# Patient Record
Sex: Female | Born: 1935
Health system: Southern US, Community
[De-identification: ages and names within clinical notes are randomized; demographics above are authoritative.]

## PROBLEM LIST (undated history)

## (undated) DIAGNOSIS — F419 Anxiety disorder, unspecified: Secondary | ICD-10-CM

## (undated) DIAGNOSIS — M1990 Unspecified osteoarthritis, unspecified site: Secondary | ICD-10-CM

## (undated) DIAGNOSIS — E785 Hyperlipidemia, unspecified: Secondary | ICD-10-CM

## (undated) DIAGNOSIS — I10 Essential (primary) hypertension: Secondary | ICD-10-CM

## (undated) HISTORY — PX: ABDOMINAL HYSTERECTOMY: SHX81

## (undated) HISTORY — DX: Hyperlipidemia, unspecified: E78.5

## (undated) HISTORY — PX: CHOLECYSTECTOMY: SHX55

## (undated) HISTORY — PX: APPENDECTOMY: SHX54

## (undated) HISTORY — DX: Anxiety disorder, unspecified: F41.9

## (undated) HISTORY — DX: Unspecified osteoarthritis, unspecified site: M19.90

## (undated) HISTORY — DX: Essential (primary) hypertension: I10

---

## 1999-05-30 ENCOUNTER — Encounter: Payer: Self-pay | Admitting: Internal Medicine

## 1999-05-30 ENCOUNTER — Encounter: Admission: RE | Admit: 1999-05-30 | Discharge: 1999-05-30 | Payer: Self-pay | Admitting: Internal Medicine

## 2000-06-15 ENCOUNTER — Encounter: Payer: Self-pay | Admitting: Internal Medicine

## 2000-06-15 ENCOUNTER — Encounter: Admission: RE | Admit: 2000-06-15 | Discharge: 2000-06-15 | Payer: Self-pay | Admitting: Internal Medicine

## 2001-03-26 ENCOUNTER — Encounter: Payer: Self-pay | Admitting: Internal Medicine

## 2001-03-26 ENCOUNTER — Encounter: Admission: RE | Admit: 2001-03-26 | Discharge: 2001-03-26 | Payer: Self-pay | Admitting: Internal Medicine

## 2001-06-23 ENCOUNTER — Encounter: Admission: RE | Admit: 2001-06-23 | Discharge: 2001-06-23 | Payer: Self-pay | Admitting: Internal Medicine

## 2001-06-23 ENCOUNTER — Encounter: Payer: Self-pay | Admitting: Internal Medicine

## 2003-04-13 ENCOUNTER — Encounter: Admission: RE | Admit: 2003-04-13 | Discharge: 2003-04-13 | Payer: Self-pay | Admitting: Internal Medicine

## 2003-04-13 ENCOUNTER — Encounter: Payer: Self-pay | Admitting: Internal Medicine

## 2004-05-28 ENCOUNTER — Encounter: Admission: RE | Admit: 2004-05-28 | Discharge: 2004-05-28 | Payer: Self-pay | Admitting: Internal Medicine

## 2004-09-27 ENCOUNTER — Ambulatory Visit: Payer: Self-pay | Admitting: Internal Medicine

## 2005-01-06 ENCOUNTER — Encounter: Admission: RE | Admit: 2005-01-06 | Discharge: 2005-01-06 | Payer: Self-pay | Admitting: Internal Medicine

## 2005-01-06 ENCOUNTER — Ambulatory Visit: Payer: Self-pay | Admitting: Internal Medicine

## 2005-10-20 ENCOUNTER — Encounter: Admission: RE | Admit: 2005-10-20 | Discharge: 2005-10-20 | Payer: Self-pay | Admitting: Internal Medicine

## 2006-06-08 ENCOUNTER — Ambulatory Visit: Payer: Self-pay | Admitting: Internal Medicine

## 2006-08-10 ENCOUNTER — Ambulatory Visit: Payer: Self-pay | Admitting: Internal Medicine

## 2006-08-10 LAB — CONVERTED CEMR LAB
ALT: 17 units/L (ref 0–40)
AST: 22 units/L (ref 0–37)
Albumin: 3.8 g/dL (ref 3.5–5.2)
Alkaline Phosphatase: 48 units/L (ref 39–117)
Bilirubin, Direct: 0.1 mg/dL (ref 0.0–0.3)
Cholesterol: 180 mg/dL (ref 0–200)
Direct LDL: 104.6 mg/dL
HDL: 34.9 mg/dL — ABNORMAL LOW (ref >39.0)
TSH: 0.94 microintl units/mL (ref 0.35–5.50)
Total Bilirubin: 0.7 mg/dL (ref 0.3–1.2)
Total CHOL/HDL Ratio: 5.2
Total Protein: 7.6 g/dL (ref 6.0–8.3)
Triglycerides: 209 mg/dL (ref 0–149)
VLDL: 42 mg/dL — ABNORMAL HIGH (ref 0–40)

## 2006-08-17 ENCOUNTER — Ambulatory Visit: Payer: Self-pay | Admitting: Internal Medicine

## 2006-11-09 ENCOUNTER — Ambulatory Visit: Payer: Self-pay | Admitting: Family Medicine

## 2006-11-09 ENCOUNTER — Encounter: Payer: Self-pay | Admitting: Internal Medicine

## 2006-11-18 ENCOUNTER — Encounter: Admission: RE | Admit: 2006-11-18 | Discharge: 2006-11-18 | Payer: Self-pay | Admitting: Internal Medicine

## 2007-01-11 DIAGNOSIS — T887XXA Unspecified adverse effect of drug or medicament, initial encounter: Secondary | ICD-10-CM | POA: Insufficient documentation

## 2007-02-02 DIAGNOSIS — M1990 Unspecified osteoarthritis, unspecified site: Secondary | ICD-10-CM

## 2007-02-02 DIAGNOSIS — I10 Essential (primary) hypertension: Secondary | ICD-10-CM | POA: Insufficient documentation

## 2007-02-02 DIAGNOSIS — E785 Hyperlipidemia, unspecified: Secondary | ICD-10-CM | POA: Insufficient documentation

## 2007-02-02 DIAGNOSIS — M810 Age-related osteoporosis without current pathological fracture: Secondary | ICD-10-CM

## 2007-02-15 ENCOUNTER — Ambulatory Visit: Payer: Self-pay | Admitting: Internal Medicine

## 2007-02-15 LAB — CONVERTED CEMR LAB
GFR calc non Af Amer: 88 mL/min
Sodium: 145 meq/L (ref 135–145)

## 2007-03-29 ENCOUNTER — Ambulatory Visit: Payer: Self-pay | Admitting: Internal Medicine

## 2007-05-25 ENCOUNTER — Ambulatory Visit: Payer: Self-pay | Admitting: Internal Medicine

## 2007-07-27 ENCOUNTER — Ambulatory Visit: Payer: Self-pay | Admitting: Internal Medicine

## 2007-07-27 DIAGNOSIS — J209 Acute bronchitis, unspecified: Secondary | ICD-10-CM

## 2007-10-26 ENCOUNTER — Ambulatory Visit: Payer: Self-pay | Admitting: Internal Medicine

## 2007-10-26 DIAGNOSIS — S93409A Sprain of unspecified ligament of unspecified ankle, initial encounter: Secondary | ICD-10-CM | POA: Insufficient documentation

## 2008-01-25 ENCOUNTER — Ambulatory Visit: Payer: Self-pay | Admitting: Internal Medicine

## 2008-01-25 LAB — CONVERTED CEMR LAB
CO2: 27 meq/L (ref 19–32)
Calcium: 9.3 mg/dL (ref 8.4–10.5)
Chloride: 100 meq/L (ref 96–112)
Creatinine, Ser: 0.8 mg/dL (ref 0.4–1.2)
GFR calc Af Amer: 91 mL/min
GFR calc non Af Amer: 75 mL/min
Potassium: 4.3 meq/L (ref 3.5–5.1)
Vit D, 1,25-Dihydroxy: 33 (ref 30–89)

## 2008-04-27 ENCOUNTER — Ambulatory Visit: Payer: Self-pay | Admitting: Internal Medicine

## 2008-04-27 DIAGNOSIS — L03039 Cellulitis of unspecified toe: Secondary | ICD-10-CM

## 2008-04-27 DIAGNOSIS — L02619 Cutaneous abscess of unspecified foot: Secondary | ICD-10-CM | POA: Insufficient documentation

## 2008-07-28 ENCOUNTER — Ambulatory Visit: Payer: Self-pay | Admitting: Internal Medicine

## 2008-07-28 LAB — CONVERTED CEMR LAB
Cholesterol, target level: 200 mg/dL
HDL goal, serum: 40 mg/dL

## 2008-10-26 ENCOUNTER — Ambulatory Visit: Payer: Self-pay | Admitting: Internal Medicine

## 2008-10-26 LAB — CONVERTED CEMR LAB
ALT: 21 units/L (ref 0–35)
Alkaline Phosphatase: 53 units/L (ref 39–117)
Bilirubin, Direct: 0.1 mg/dL (ref 0.0–0.3)
CO2: 30 meq/L (ref 19–32)
Calcium: 9.2 mg/dL (ref 8.4–10.5)
Cholesterol: 166 mg/dL (ref 0–200)
Eosinophils Absolute: 0.3 10*3/uL (ref 0.0–0.7)
Eosinophils Relative: 3.5 % (ref 0.0–5.0)
HCT: 40.6 % (ref 36.0–46.0)
HDL: 31.8 mg/dL — ABNORMAL LOW (ref >39.00)
Hemoglobin: 13.5 g/dL (ref 12.0–15.0)
Ketones, urine, test strip: NEGATIVE
LDL Cholesterol: 109 mg/dL — ABNORMAL HIGH (ref 0–99)
Lymphs Abs: 2 10*3/uL (ref 0.7–4.0)
Monocytes Absolute: 0.7 10*3/uL (ref 0.1–1.0)
Platelets: 225 10*3/uL (ref 150.0–400.0)
RDW: 13.4 % (ref 11.5–14.6)
Sodium: 144 meq/L (ref 135–145)
TSH: 1.35 microintl units/mL (ref 0.35–5.50)
Total Bilirubin: 0.8 mg/dL (ref 0.3–1.2)
Triglycerides: 126 mg/dL (ref 0.0–149.0)
VLDL: 25.2 mg/dL (ref 0.0–40.0)
WBC: 7.5 10*3/uL (ref 4.5–10.5)

## 2008-11-01 ENCOUNTER — Ambulatory Visit: Payer: Self-pay | Admitting: Internal Medicine

## 2009-03-22 ENCOUNTER — Encounter: Admission: RE | Admit: 2009-03-22 | Discharge: 2009-03-22 | Payer: Self-pay | Admitting: Internal Medicine

## 2009-03-28 ENCOUNTER — Encounter: Payer: Self-pay | Admitting: Internal Medicine

## 2009-03-28 ENCOUNTER — Ambulatory Visit: Payer: Self-pay | Admitting: Internal Medicine

## 2009-03-28 LAB — HM DEXA SCAN

## 2009-05-01 ENCOUNTER — Ambulatory Visit: Payer: Self-pay | Admitting: Internal Medicine

## 2009-05-01 DIAGNOSIS — M461 Sacroiliitis, not elsewhere classified: Secondary | ICD-10-CM | POA: Insufficient documentation

## 2009-05-01 DIAGNOSIS — M47818 Spondylosis without myelopathy or radiculopathy, sacral and sacrococcygeal region: Secondary | ICD-10-CM

## 2009-09-04 ENCOUNTER — Ambulatory Visit: Payer: Self-pay | Admitting: Internal Medicine

## 2010-01-29 ENCOUNTER — Ambulatory Visit: Payer: Self-pay | Admitting: Internal Medicine

## 2010-01-29 LAB — CONVERTED CEMR LAB
Calcium: 10.3 mg/dL (ref 8.4–10.5)
Chloride: 108 meq/L (ref 96–112)
Creatinine, Ser: 0.8 mg/dL (ref 0.4–1.2)
Potassium: 4.5 meq/L (ref 3.5–5.1)
Sodium: 144 meq/L (ref 135–145)

## 2010-05-28 ENCOUNTER — Ambulatory Visit: Payer: Self-pay | Admitting: Internal Medicine

## 2010-05-28 LAB — CONVERTED CEMR LAB
Calcium: 9.1 mg/dL (ref 8.4–10.5)
Creatinine, Ser: 0.7 mg/dL (ref 0.4–1.2)
GFR calc non Af Amer: 82.71 mL/min (ref >60)

## 2010-08-22 NOTE — Assessment & Plan Note (Signed)
Summary: 4 month rov/njr   Vital Signs:  Patient profile:   77 year old female Height:      65 inches Weight:      230 pounds BMI:     38.41 Temp:     98.2 degrees F oral Pulse rate:   72 / minute Pulse rhythm:   regular Resp:     14 per minute BP sitting:   170 / 90  (left arm) Cuff size:   large  Vitals Entered By: Willy Eddy, LPN (September 04, 2009 1:32 PM) CC: roa--, Hypertension Management   CC:  roa-- and Hypertension Management.  History of Present Illness: pt presents for follow up of blood pressure review of bone density has a vertibral  colapse  Hypertension History:      She denies headache, chest pain, palpitations, dyspnea with exertion, orthopnea, PND, peripheral edema, visual symptoms, neurologic problems, syncope, and side effects from treatment.  loss of control due to eating country ham!.        Positive major cardiovascular risk factors include female age 57 years old or older, hyperlipidemia, and hypertension.  Negative major cardiovascular risk factors include non-tobacco-user status.     Preventive Screening-Counseling & Management  Alcohol-Tobacco     Smoking Status: never  Problems Prior to Update: 1)  Sacroiliac Strain  (ICD-846.9) 2)  Cellulitis, Great Toe, Left  (ICD-681.10) 3)  Ankle Sprain, Right  (ICD-845.00) 4)  Acute Bronchitis  (ICD-466.0) 5)  Advef, Drug/medicinal/biological Subst Nos  (ICD-995.20) 6)  Hyperlipidemia  (ICD-272.4) 7)  Osteoporosis  (ICD-733.00) 8)  Osteoarthritis  (ICD-715.90) 9)  Hypertension  (ICD-401.9)  Current Problems (verified): 1)  Sacroiliac Strain  (ICD-846.9) 2)  Cellulitis, Great Toe, Left  (ICD-681.10) 3)  Ankle Sprain, Right  (ICD-845.00) 4)  Acute Bronchitis  (ICD-466.0) 5)  Advef, Drug/medicinal/biological Subst Nos  (ICD-995.20) 6)  Hyperlipidemia  (ICD-272.4) 7)  Osteoporosis  (ICD-733.00) 8)  Osteoarthritis  (ICD-715.90) 9)  Hypertension  (ICD-401.9)  Medications Prior to Update: 1)   Etodolac 400 Mg  Tabs (Etodolac) .... Once Daily 2)  Alprazolam 0.25 Mg  Tabs (Alprazolam) .... As Directed  One By Mouth Q 6 Hours Prn 3)  Exforge 5-320 Mg  Tabs (Amlodipine Besylate-Valsartan) .... One By Mouth Daily 4)  Ziac 5-6.25 Mg  Tabs (Bisoprolol-Hydrochlorothiazide) .... One By Mouth Daily 5)  Vitamin D 04540 Unit  Caps (Ergocalciferol) .... One By Mouth Weekly 6)  Fish Oil Concentrate 1000 Mg Caps (Omega-3 Fatty Acids) .Marland Kitchen.. 1 Once Daily 7)  Calcium 500/d 500-200 Mg-Unit Tabs (Calcium Carbonate-Vitamin D) .Marland Kitchen.. 1 Once Daily  Current Medications (verified): 1)  Etodolac 400 Mg  Tabs (Etodolac) .... Once Daily 2)  Alprazolam 0.25 Mg  Tabs (Alprazolam) .... As Directed  One By Mouth Q 6 Hours Prn 3)  Exforge 5-320 Mg  Tabs (Amlodipine Besylate-Valsartan) .... One By Mouth Daily 4)  Ziac 5-6.25 Mg  Tabs (Bisoprolol-Hydrochlorothiazide) .... One By Mouth Daily 5)  Vitamin D 98119 Unit  Caps (Ergocalciferol) .... One By Mouth Weekly 6)  Fish Oil Concentrate 1000 Mg Caps (Omega-3 Fatty Acids) .Marland Kitchen.. 1 Once Daily 7)  Calcium 500/d 500-200 Mg-Unit Tabs (Calcium Carbonate-Vitamin D) .Marland Kitchen.. 1 Once Daily  Allergies (verified): No Known Drug Allergies  Past History:  Family History: Last updated: 08-Mar-2007 father died a 41 due to age Mother died at 74  Social History: Last updated: 03/29/2007 Married Never Smoked Retired  Risk Factors: Smoking Status: never (09/04/2009)  Past medical, surgical, family and social histories (  including risk factors) reviewed, and no changes noted (except as noted below).  Past Medical History: Reviewed history from 02/02/2007 and no changes required. Hypertension Osteoarthritis Osteoporosis Hyperlipidemia  Past Surgical History: Reviewed history from 02/02/2007 and no changes required. Appendectomy Hysterectomy Cholecystectomy  Family History: Reviewed history from 02/15/2007 and no changes required. father died a 49 due to age Mother died  at 74  Social History: Reviewed history from 03/29/2007 and no changes required. Married Never Smoked Retired  Review of Systems       The patient complains of weight gain.  The patient denies anorexia, fever, weight loss, vision loss, decreased hearing, hoarseness, chest pain, syncope, dyspnea on exertion, peripheral edema, prolonged cough, headaches, hemoptysis, abdominal pain, melena, hematochezia, severe indigestion/heartburn, hematuria, incontinence, genital sores, muscle weakness, suspicious skin lesions, transient blindness, difficulty walking, depression, unusual weight change, abnormal bleeding, enlarged lymph nodes, angioedema, and breast masses.    Physical Exam  General:  alert and overweight-appearing.   Head:  normocephalic and atraumatic.   Eyes:  pupils equal and pupils round.   Ears:  R ear normal and L ear normal.   Nose:  no external deformity and no nasal discharge.   Neck:  supple and no masses.   Lungs:  Normal respiratory effort, chest expands symmetrically. Lungs are clear to auscultation, no crackles or wheezes. Heart:  normal rate and regular rhythm.   Abdomen:  soft and non-tender.   Msk:  no joint tenderness and no joint swelling.   Neurologic:  alert & oriented X3 and finger-to-nose normal.     Impression & Recommendations:  Problem # 1:  SACROILIAC STRAIN (ICD-846.9) Assessment Unchanged has a small vertibral colapse, controlled with exercize  Problem # 2:  OSTEOARTHRITIS (ICD-715.90) Assessment: Unchanged hand exercizes taught Her updated medication list for this problem includes:    Etodolac 400 Mg Tabs (Etodolac) ..... Once daily  Discussed use of medications, application of heat or cold, and exercises.   Problem # 3:  HYPERTENSION (ICD-401.9)  instructed to avoid high salt content food! Her updated medication list for this problem includes:    Azor 5-40 Mg Tabs (Amlodipine-olmesartan) ..... One by mouth daily    Ziac 5-6.25 Mg Tabs  (Bisoprolol-hydrochlorothiazide) ..... One by mouth daily  BP today: 170/90 Prior BP: 120/78 (05/01/2009)  10 Yr Risk Heart Disease: > 32 % Prior 10 Yr Risk Heart Disease: 20 % (05/01/2009)  Labs Reviewed: K+: 4.4 (10/26/2008) Creat: : 0.6 (10/26/2008)   Chol: 166 (10/26/2008)   HDL: 31.80 (10/26/2008)   LDL: 109 (10/26/2008)   TG: 126.0 (10/26/2008)  Complete Medication List: 1)  Etodolac 400 Mg Tabs (Etodolac) .... Once daily 2)  Alprazolam 0.25 Mg Tabs (Alprazolam) .... As directed  one by mouth q 6 hours prn 3)  Azor 5-40 Mg Tabs (Amlodipine-olmesartan) .... One by mouth daily 4)  Ziac 5-6.25 Mg Tabs (Bisoprolol-hydrochlorothiazide) .... One by mouth daily 5)  Vitamin D 60454 Unit Caps (Ergocalciferol) .... One by mouth weekly 6)  Fish Oil Concentrate 1000 Mg Caps (Omega-3 fatty acids) .Marland Kitchen.. 1 once daily 7)  Calcium 500/d 500-200 Mg-unit Tabs (Calcium carbonate-vitamin d) .Marland Kitchen.. 1 once daily  Hypertension Assessment/Plan:      The patient's hypertensive risk group is category B: At least one risk factor (excluding diabetes) with no target organ damage.  Her calculated 10 year risk of coronary heart disease is > 32 %.  Today's blood pressure is 170/90.  Her blood pressure goal is < 140/90.  Patient Instructions: 1)  Please schedule a follow-up appointment in 4 months.

## 2010-08-22 NOTE — Assessment & Plan Note (Signed)
Summary: 4 month rov/njr   Vital Signs:  Patient profile:   77 year old female Height:      65 inches Weight:      228 pounds BMI:     38.08 Temp:     98.1 degrees F oral Pulse rate:   72 / minute Resp:     14 per minute BP sitting:   135 / 80  (left arm)  Vitals Entered By: Willy Eddy, LPN (May 28, 2010 1:32 PM) CC: rpa, Lipid Management, Hypertension Management Is Patient Diabetic? No   Primary Care Provider:  Stacie Glaze MD  CC:  rpa, Lipid Management, and Hypertension Management.  History of Present Illness: last visit the pt has lipids which were at goals for ldl and total no chest pain or SOB no edema abdherant to blood presure medications   Hypertension History:      Positive major cardiovascular risk factors include female age 15 years old or older, hyperlipidemia, and hypertension.  Negative major cardiovascular risk factors include non-tobacco-Delsin Copen status.    Lipid Management History:      Positive NCEP/ATP III risk factors include female age 36 years old or older, HDL cholesterol less than 40, and hypertension.  Negative NCEP/ATP III risk factors include non-tobacco-Sartaj Hoskin status.      Preventive Screening-Counseling & Management  Alcohol-Tobacco     Smoking Status: never     Tobacco Counseling: not indicated; no tobacco use  Problems Prior to Update: 1)  Sacroiliac Strain  (ICD-846.9) 2)  Cellulitis, Great Toe, Left  (ICD-681.10) 3)  Ankle Sprain, Right  (ICD-845.00) 4)  Acute Bronchitis  (ICD-466.0) 5)  Advef, Drug/medicinal/biological Subst Nos  (ICD-995.20) 6)  Hyperlipidemia  (ICD-272.4) 7)  Osteoporosis  (ICD-733.00) 8)  Osteoarthritis  (ICD-715.90) 9)  Hypertension  (ICD-401.9)  Current Problems (verified): 1)  Sacroiliac Strain  (ICD-846.9) 2)  Cellulitis, Great Toe, Left  (ICD-681.10) 3)  Ankle Sprain, Right  (ICD-845.00) 4)  Acute Bronchitis  (ICD-466.0) 5)  Advef, Drug/medicinal/biological Subst Nos  (ICD-995.20) 6)   Hyperlipidemia  (ICD-272.4) 7)  Osteoporosis  (ICD-733.00) 8)  Osteoarthritis  (ICD-715.90) 9)  Hypertension  (ICD-401.9)  Medications Prior to Update: 1)  Etodolac 400 Mg  Tabs (Etodolac) .... Once Daily 2)  Alprazolam 0.25 Mg  Tabs (Alprazolam) .... As Directed  One By Mouth Q 6 Hours Prn 3)  Azor 5-40 Mg Tabs (Amlodipine-Olmesartan) .... One By Mouth Daily 4)  Ziac 5-6.25 Mg  Tabs (Bisoprolol-Hydrochlorothiazide) .... One By Mouth Daily 5)  Vitamin D 04540 Unit  Caps (Ergocalciferol) .... One By Mouth Weekly 6)  Fish Oil Concentrate 1000 Mg Caps (Omega-3 Fatty Acids) .Marland Kitchen.. 1 Once Daily 7)  Calcium 500/d 500-200 Mg-Unit Tabs (Calcium Carbonate-Vitamin D) .Marland Kitchen.. 1 Once Daily  Current Medications (verified): 1)  Etodolac 400 Mg  Tabs (Etodolac) .... Once Daily 2)  Alprazolam 0.25 Mg  Tabs (Alprazolam) .... As Directed  One By Mouth Q 6 Hours Prn 3)  Azor 5-40 Mg Tabs (Amlodipine-Olmesartan) .... One By Mouth Daily 4)  Ziac 5-6.25 Mg  Tabs (Bisoprolol-Hydrochlorothiazide) .... One By Mouth Daily 5)  Vitamin D 98119 Unit  Caps (Ergocalciferol) .... One By Mouth Weekly 6)  Fish Oil Concentrate 1000 Mg Caps (Omega-3 Fatty Acids) .Marland Kitchen.. 1 Once Daily 7)  Calcium 500/d 500-200 Mg-Unit Tabs (Calcium Carbonate-Vitamin D) .Marland Kitchen.. 1 Once Daily  Allergies (verified): No Known Drug Allergies  Past History:  Family History: Last updated: 03-05-2007 father died a 59 due to age Mother died  at 66  Social History: Last updated: 03/29/2007 Married Never Smoked Retired  Risk Factors: Smoking Status: never (05/28/2010)  Past medical, surgical, family and social histories (including risk factors) reviewed, and no changes noted (except as noted below).  Past Medical History: Reviewed history from 02/02/2007 and no changes required. Hypertension Osteoarthritis Osteoporosis Hyperlipidemia  Past Surgical History: Reviewed history from 02/02/2007 and no changes  required. Appendectomy Hysterectomy Cholecystectomy  Family History: Reviewed history from 02/15/2007 and no changes required. father died a 69 due to age Mother died at 77  Social History: Reviewed history from 03/29/2007 and no changes required. Married Never Smoked Retired  Review of Systems  The patient denies anorexia, fever, weight loss, weight gain, vision loss, decreased hearing, hoarseness, chest pain, syncope, dyspnea on exertion, peripheral edema, prolonged cough, headaches, hemoptysis, abdominal pain, melena, hematochezia, severe indigestion/heartburn, hematuria, incontinence, genital sores, muscle weakness, suspicious skin lesions, transient blindness, difficulty walking, depression, unusual weight change, abnormal bleeding, enlarged lymph nodes, angioedema, and breast masses.    Physical Exam  General:  alert and overweight-appearing.   Head:  normocephalic and atraumatic.   Eyes:  pupils equal and pupils round.   Ears:  R ear normal and L ear normal.   Nose:  no external deformity and no nasal discharge.   Mouth:  pharynx pink and moist.  no erythema.   Neck:  supple and no masses.   Lungs:  Normal respiratory effort, chest expands symmetrically. Lungs are clear to auscultation, no crackles or wheezes. Heart:  normal rate and regular rhythm.   Abdomen:  soft and non-tender.   Msk:  lumbar lordosis and SI joint tenderness.   Extremities:  trace left pedal edema and trace right pedal edema.   Neurologic:  alert & oriented X3 and finger-to-nose normal.     Impression & Recommendations:  Problem # 1:  OSTEOPOROSIS (ICD-733.00)  moniter the levles of d before refilling the ergocalcifreol Discussed medication use, applications of heat or ice, and exercises.   Orders: T-Vitamin D (25-Hydroxy) [id number])  Problem # 2:  HYPERTENSION (ICD-401.9)  Her updated medication list for this problem includes:    Azor 5-40 Mg Tabs (Amlodipine-olmesartan) ..... One by  mouth daily    Ziac 5-6.25 Mg Tabs (Bisoprolol-hydrochlorothiazide) ..... One by mouth daily  BP today: 135/80 Prior BP: 138/80 (01/29/2010)  10 Yr Risk Heart Disease: 13 % Prior 10 Yr Risk Heart Disease: 20 % (01/29/2010)  Labs Reviewed: K+: 4.5 (01/29/2010) Creat: : 0.8 (01/29/2010)   Chol: 173 (01/29/2010)   HDL: 36.50 (01/29/2010)   LDL: 109 (10/26/2008)   TG: 126.0 (10/26/2008)  Orders: TLB-BMP (Basic Metabolic Panel-BMET) (80048-METABOL) Venipuncture (09811)  Problem # 3:  SACROILIAC STRAIN (ICD-846.9)  persistant  Orders: T-Lumbar Spine Comp w/Bend View ([id number])  Problem # 4:  OSTEOARTHRITIS (ICD-715.90)  Her updated medication list for this problem includes:    Etodolac 400 Mg Tabs (Etodolac) ..... Once daily  Discussed use of medications, application of heat or cold, and exercises.   Complete Medication List: 1)  Etodolac 400 Mg Tabs (Etodolac) .... Once daily 2)  Alprazolam 0.25 Mg Tabs (Alprazolam) .... As directed  one by mouth q 6 hours prn 3)  Azor 5-40 Mg Tabs (Amlodipine-olmesartan) .... One by mouth daily 4)  Ziac 5-6.25 Mg Tabs (Bisoprolol-hydrochlorothiazide) .... One by mouth daily 5)  Vitamin D 56213 Unit Caps (Ergocalciferol) .... One by mouth weekly 6)  Fish Oil Concentrate 1000 Mg Caps (Omega-3 fatty acids) .Marland Kitchen.. 1 once daily 7)  Calcium 500/d  500-200 Mg-unit Tabs (Calcium carbonate-vitamin d) .Marland Kitchen.. 1 once daily  Hypertension Assessment/Plan:      The patient's hypertensive risk group is category B: At least one risk factor (excluding diabetes) with no target organ damage.  Her calculated 10 year risk of coronary heart disease is 13 %.  Today's blood pressure is 135/80.  Her blood pressure goal is < 140/90.  Lipid Assessment/Plan:      Based on NCEP/ATP III, the patient's risk factor category is "2 or more risk factors and a calculated 10 year CAD risk of < 20%".  The patient's lipid goals are as follows: Total cholesterol goal is 200; LDL  cholesterol goal is 130; HDL cholesterol goal is 40; Triglyceride goal is 150.  Her LDL cholesterol goal has been met.    Patient Instructions: 1)  Please schedule a follow-up appointment in 4 months. Prescriptions: ZIAC 5-6.25 MG  TABS (BISOPROLOL-HYDROCHLOROTHIAZIDE) one by mouth daily  #30 x 11   Entered and Authorized by:   Stacie Glaze MD   Signed by:   Stacie Glaze MD on 05/28/2010   Method used:   Electronically to        [facility] [phone]* (retail)       [address]       [place], [place]  [address]       Ph: [phone]       Fax: [phone]   RxID:   [id number]    Orders Added: 1)  Est. Patient Level IV [24401] 2)  TLB-BMP (Basic Metabolic Panel-BMET) [80048-METABOL] 3)  Venipuncture [02725] 4)  T-Vitamin D (25-Hydroxy) [36644-03474] 5)  T-Lumbar Spine Comp w/Bend View [72114TC]  Appended Document: Orders Update     Clinical Lists Changes  Orders: Added new Service order of Specimen Handling (25956) - Signed

## 2010-08-22 NOTE — Assessment & Plan Note (Signed)
Summary: 4 MTH F/U // RS   Vital Signs:  Patient profile:   77 year old female Height:      65 inches Weight:      224 pounds BMI:     37.41 Temp:     98.2 degrees F oral Pulse rate:   72 / minute Resp:     14 per minute BP sitting:   138 / 80  (left arm) Cuff size:   large  Vitals Entered By: Willy Eddy, LPN (January 29, 2010 3:42 PM) CC: roa, Hypertension Management, Lipid Management   CC:  roa, Hypertension Management, and Lipid Management.  History of Present Illness: Her husband is still alive and had a recent case of pnemonia lost 6 pounds of weight  planned  Hypertension History:      She denies headache, chest pain, palpitations, dyspnea with exertion, orthopnea, PND, peripheral edema, visual symptoms, neurologic problems, syncope, and side effects from treatment.  She notes problems with antihypertensive medication side effects.        Positive major cardiovascular risk factors include female age 42 years old or older, hyperlipidemia, and hypertension.  Negative major cardiovascular risk factors include non-tobacco-user status.    Lipid Management History:      Positive NCEP/ATP III risk factors include female age 68 years old or older, HDL cholesterol less than 40, and hypertension.  Negative NCEP/ATP III risk factors include non-tobacco-user status.      Preventive Screening-Counseling & Management  Alcohol-Tobacco     Smoking Status: never  Current Problems (verified): 1)  Sacroiliac Strain  (ICD-846.9) 2)  Cellulitis, Great Toe, Left  (ICD-681.10) 3)  Ankle Sprain, Right  (ICD-845.00) 4)  Acute Bronchitis  (ICD-466.0) 5)  Advef, Drug/medicinal/biological Subst Nos  (ICD-995.20) 6)  Hyperlipidemia  (ICD-272.4) 7)  Osteoporosis  (ICD-733.00) 8)  Osteoarthritis  (ICD-715.90) 9)  Hypertension  (ICD-401.9)  Current Medications (verified): 1)  Etodolac 400 Mg  Tabs (Etodolac) .... Once Daily 2)  Alprazolam 0.25 Mg  Tabs (Alprazolam) .... As Directed  One  By Mouth Q 6 Hours Prn 3)  Azor 5-40 Mg Tabs (Amlodipine-Olmesartan) .... One By Mouth Daily 4)  Ziac 5-6.25 Mg  Tabs (Bisoprolol-Hydrochlorothiazide) .... One By Mouth Daily 5)  Vitamin D 16109 Unit  Caps (Ergocalciferol) .... One By Mouth Weekly 6)  Fish Oil Concentrate 1000 Mg Caps (Omega-3 Fatty Acids) .Marland Kitchen.. 1 Once Daily 7)  Calcium 500/d 500-200 Mg-Unit Tabs (Calcium Carbonate-Vitamin D) .Marland Kitchen.. 1 Once Daily  Allergies (verified): No Known Drug Allergies  Past History:  Family History: Last updated: 03-09-2007 father died a 56 due to age Mother died at 12  Social History: Last updated: 03/29/2007 Married Never Smoked Retired  Risk Factors: Smoking Status: never (01/29/2010)  Past medical, surgical, family and social histories (including risk factors) reviewed, and no changes noted (except as noted below).  Past Medical History: Reviewed history from 02/02/2007 and no changes required. Hypertension Osteoarthritis Osteoporosis Hyperlipidemia  Past Surgical History: Reviewed history from 02/02/2007 and no changes required. Appendectomy Hysterectomy Cholecystectomy  Family History: Reviewed history from March 09, 2007 and no changes required. father died a 54 due to age Mother died at 22  Social History: Reviewed history from 03/29/2007 and no changes required. Married Never Smoked Retired  Review of Systems       The patient complains of weight gain.  The patient denies anorexia, fever, weight loss, vision loss, decreased hearing, hoarseness, chest pain, syncope, dyspnea on exertion, peripheral edema, prolonged cough, headaches, hemoptysis, abdominal pain,  melena, hematochezia, severe indigestion/heartburn, hematuria, incontinence, genital sores, muscle weakness, suspicious skin lesions, transient blindness, difficulty walking, depression, unusual weight change, abnormal bleeding, enlarged lymph nodes, angioedema, and breast masses.         obesity  Physical  Exam  General:  alert and overweight-appearing.   Head:  normocephalic and atraumatic.   Eyes:  pupils equal and pupils round.   Ears:  R ear normal and L ear normal.   Nose:  no external deformity and no nasal discharge.   Neck:  supple and no masses.   Lungs:  Normal respiratory effort, chest expands symmetrically. Lungs are clear to auscultation, no crackles or wheezes. Heart:  normal rate and regular rhythm.   Abdomen:  soft and non-tender.   Msk:  no joint tenderness and no joint swelling.   Extremities:  trace left pedal edema and trace right pedal edema.   Neurologic:  alert & oriented X3 and finger-to-nose normal.     Impression & Recommendations:  Problem # 1:  HYPERTENSION (ICD-401.9)  Her updated medication list for this problem includes:    Azor 5-40 Mg Tabs (Amlodipine-olmesartan) ..... One by mouth daily    Ziac 5-6.25 Mg Tabs (Bisoprolol-hydrochlorothiazide) ..... One by mouth daily  BP today: 138/80 Prior BP: 170/90 (09/04/2009)  10 Yr Risk Heart Disease: 20 % Prior 10 Yr Risk Heart Disease: > 32 % (09/04/2009)  Labs Reviewed: K+: 4.4 (10/26/2008) Creat: : 0.6 (10/26/2008)   Chol: 166 (10/26/2008)   HDL: 31.80 (10/26/2008)   LDL: 109 (10/26/2008)   TG: 126.0 (10/26/2008)  Orders: TLB-BMP (Basic Metabolic Panel-BMET) (80048-METABOL)  Problem # 2:  OSTEOARTHRITIS (ICD-715.90)  Her updated medication list for this problem includes:    Etodolac 400 Mg Tabs (Etodolac) ..... Once daily  Discussed use of medications, application of heat or cold, and exercises.   Problem # 3:  HYPERLIPIDEMIA (ICD-272.4)  check labs Labs Reviewed: SGOT: 21 (10/26/2008)   SGPT: 21 (10/26/2008)  Lipid Goals: Chol Goal: 200 (07/28/2008)   HDL Goal: 40 (07/28/2008)   LDL Goal: 130 (07/28/2008)   TG Goal: 150 (07/28/2008)  10 Yr Risk Heart Disease: 20 % Prior 10 Yr Risk Heart Disease: > 32 % (09/04/2009)   HDL:31.80 (10/26/2008), 34.9 (08/10/2006)  LDL:109 (10/26/2008), DEL  (08/10/2006)  Chol:166 (10/26/2008), 180 (08/10/2006)  Trig:126.0 (10/26/2008), 209 (08/10/2006)  Orders: Venipuncture (09811) TLB-Cholesterol, HDL (83718-HDL) TLB-Cholesterol, Direct LDL (83721-DIRLDL) TLB-Cholesterol, Total (82465-CHO) TLB-TSH (Thyroid Stimulating Hormone) (84443-TSH)  Complete Medication List: 1)  Etodolac 400 Mg Tabs (Etodolac) .... Once daily 2)  Alprazolam 0.25 Mg Tabs (Alprazolam) .... As directed  one by mouth q 6 hours prn 3)  Azor 5-40 Mg Tabs (Amlodipine-olmesartan) .... One by mouth daily 4)  Ziac 5-6.25 Mg Tabs (Bisoprolol-hydrochlorothiazide) .... One by mouth daily 5)  Vitamin D 91478 Unit Caps (Ergocalciferol) .... One by mouth weekly 6)  Fish Oil Concentrate 1000 Mg Caps (Omega-3 fatty acids) .Marland Kitchen.. 1 once daily 7)  Calcium 500/d 500-200 Mg-unit Tabs (Calcium carbonate-vitamin d) .Marland Kitchen.. 1 once daily  Hypertension Assessment/Plan:      The patient's hypertensive risk group is category B: At least one risk factor (excluding diabetes) with no target organ damage.  Her calculated 10 year risk of coronary heart disease is 20 %.  Today's blood pressure is 138/80.  Her blood pressure goal is < 140/90.  Lipid Assessment/Plan:      Based on NCEP/ATP III, the patient's risk factor category is "2 or more risk factors and a calculated 10 year  CAD risk of < 20%".  The patient's lipid goals are as follows: Total cholesterol goal is 200; LDL cholesterol goal is 130; HDL cholesterol goal is 40; Triglyceride goal is 150.    Patient Instructions: 1)  Please schedule a follow-up appointment in 4 months. Prescriptions: ALPRAZOLAM 0.25 MG  TABS (ALPRAZOLAM) as directed  one by mouth q 6 hours prn  #30 x 3   Entered by:   Willy Eddy, LPN   Authorized by:   Stacie Glaze MD   Signed by:   Willy Eddy, LPN on 84/69/6295   Method used:   Print then Give to Patient   RxID:   [id number]

## 2010-08-26 ENCOUNTER — Other Ambulatory Visit: Payer: Self-pay | Admitting: Internal Medicine

## 2010-09-25 ENCOUNTER — Ambulatory Visit (INDEPENDENT_AMBULATORY_CARE_PROVIDER_SITE_OTHER)
Admission: RE | Admit: 2010-09-25 | Discharge: 2010-09-25 | Disposition: A | Discharge status: Home / Self Care | Payer: Medicare Other | Source: Ambulatory Visit | Attending: Internal Medicine | Admitting: Internal Medicine

## 2010-09-25 ENCOUNTER — Ambulatory Visit: Payer: Medicare Other | Admitting: Internal Medicine

## 2010-09-25 ENCOUNTER — Encounter: Payer: Self-pay | Admitting: Internal Medicine

## 2010-09-25 VITALS — BP 148/84 | HR 88 | Temp 98.6°F | Wt 228.0 lb

## 2010-09-25 DIAGNOSIS — M1990 Unspecified osteoarthritis, unspecified site: Secondary | ICD-10-CM

## 2010-09-25 DIAGNOSIS — S336XXA Sprain of sacroiliac joint, initial encounter: Secondary | ICD-10-CM

## 2010-09-25 DIAGNOSIS — I10 Essential (primary) hypertension: Secondary | ICD-10-CM

## 2010-09-25 DIAGNOSIS — E785 Hyperlipidemia, unspecified: Secondary | ICD-10-CM

## 2010-09-25 DIAGNOSIS — M810 Age-related osteoporosis without current pathological fracture: Secondary | ICD-10-CM

## 2010-09-25 NOTE — Patient Instructions (Signed)
He may take the etodolac twice a day as needed for pain.  We will give you samples of the a sore today.  Stay on the vitamin D at the current dose.

## 2010-09-25 NOTE — Assessment & Plan Note (Signed)
Did not get the xray  Suggested stopping on the way home today

## 2010-09-25 NOTE — Assessment & Plan Note (Signed)
One the etodolac twice daily and this has helped her arthritic pain

## 2010-09-25 NOTE — Assessment & Plan Note (Signed)
Her blood pressure is well-controlled on her current medications her potassium and her kidney function are stable

## 2010-09-25 NOTE — Assessment & Plan Note (Signed)
Her cholesterol is monitored at last visit and she is at goal level for her age she is on no new good lowering medications at this time

## 2010-09-25 NOTE — Assessment & Plan Note (Signed)
He is stable at 54 the prior one was 52 she is on the ergocalciferol weekly and this has maintained a stable vitamin D level we'll continue this medicine long-term.

## 2011-01-27 ENCOUNTER — Encounter: Payer: Self-pay | Admitting: Internal Medicine

## 2011-01-27 ENCOUNTER — Ambulatory Visit (INDEPENDENT_AMBULATORY_CARE_PROVIDER_SITE_OTHER): Payer: Medicare Other | Admitting: Internal Medicine

## 2011-01-27 VITALS — BP 144/90 | HR 76 | Temp 98.2°F | Resp 16 | Ht 65.0 in | Wt 220.0 lb

## 2011-01-27 DIAGNOSIS — M549 Dorsalgia, unspecified: Secondary | ICD-10-CM

## 2011-01-27 DIAGNOSIS — IMO0002 Reserved for concepts with insufficient information to code with codable children: Secondary | ICD-10-CM

## 2011-01-27 DIAGNOSIS — I10 Essential (primary) hypertension: Secondary | ICD-10-CM

## 2011-01-27 NOTE — Patient Instructions (Signed)
You need to take the etodolac or Aleve but cannot take both he may take Tylenol for breakthrough pain when on either of these medications.  I will give you some samples of extended release Naprosyn (which is the ingredient in Aleve) and if it works better to just use Aleve rather than the

## 2011-01-27 NOTE — Progress Notes (Signed)
  Subjective:    Patient ID: Jenny [name], female    DOB: 05-10-1934, 77 y.o.   MRN: [id number]  HPI ELT of the patient's back pain is apparent from her plain film x-rays which is a new compression fracture at L5 she believes that she recalls this occurring when she was lifting her husband.  The pain is lessening but still present we discussed options including epidurals at this time we'll monitor it and let us know if the pain becomes severe and we need to do an intervention   Review of Systems  Constitutional: Negative for activity change, appetite change and fatigue.  HENT: Negative for ear pain, congestion, neck pain, postnasal drip and sinus pressure.   Eyes: Negative for redness and visual disturbance.  Respiratory: Negative for cough, shortness of breath and wheezing.   Gastrointestinal: Negative for abdominal pain and abdominal distention.  Genitourinary: Negative for dysuria, frequency and menstrual problem.  Musculoskeletal: Negative for myalgias, joint swelling and arthralgias.  Skin: Negative for rash and wound.  Neurological: Negative for dizziness, weakness and headaches.  Hematological: Negative for adenopathy. Does not bruise/bleed easily.  Psychiatric/Behavioral: Negative for sleep disturbance and decreased concentration.       Objective:   Physical Exam  Constitutional: She is oriented to person, place, and time. She appears well-developed and well-nourished. No distress.  HENT:  Head: Normocephalic and atraumatic.  Right Ear: External ear normal.  Left Ear: External ear normal.  Nose: Nose normal.  Mouth/Throat: Oropharynx is clear and moist.  Eyes: Conjunctivae and EOM are normal. Pupils are equal, round, and reactive to light.  Neck: Normal range of motion. Neck supple. No JVD present. No tracheal deviation present. No thyromegaly present.  Cardiovascular: Normal rate, regular rhythm, normal heart sounds and intact distal pulses.   No murmur  heard. Pulmonary/Chest: Effort normal and breath sounds normal. She has no wheezes. She exhibits no tenderness.  Abdominal: Soft. Bowel sounds are normal.  Musculoskeletal: Normal range of motion. She exhibits no edema and no tenderness.  Lymphadenopathy:    She has no cervical adenopathy.  Neurological: She is alert and oriented to person, place, and time. She has normal reflexes. No cranial nerve deficit.  Skin: Skin is warm and dry. She is not diaphoretic.  Psychiatric: She has a normal mood and affect. Her behavior is normal.   mild appropriate grief reaction        Assessment & Plan:  We discussed the use of nonsteroidals she is on a total ask her therefore we would recommend the use of Tylenol in addition to this medication and not another nonsteroidal We also reviewed the patient's grief reaction to the loss of her husband and dealing with her family we talked about appropriate ways to seek counseling should she find herself with increasing depression.  Hospice has available counseling post the death of the patient in hospice.  Slight elevation of blood pressures probably due to excessive use of nonsteroidals Monitoring of renal function is recommended

## 2011-04-28 ENCOUNTER — Encounter: Payer: Self-pay | Admitting: Internal Medicine

## 2011-04-28 ENCOUNTER — Ambulatory Visit (INDEPENDENT_AMBULATORY_CARE_PROVIDER_SITE_OTHER): Payer: Medicare Other | Admitting: Internal Medicine

## 2011-04-28 VITALS — BP 136/86 | HR 76 | Temp 98.2°F | Resp 16 | Ht 65.0 in | Wt 230.0 lb

## 2011-04-28 DIAGNOSIS — E669 Obesity, unspecified: Secondary | ICD-10-CM

## 2011-04-28 DIAGNOSIS — M1990 Unspecified osteoarthritis, unspecified site: Secondary | ICD-10-CM

## 2011-04-28 DIAGNOSIS — I10 Essential (primary) hypertension: Secondary | ICD-10-CM

## 2011-04-28 DIAGNOSIS — E785 Hyperlipidemia, unspecified: Secondary | ICD-10-CM

## 2011-04-28 LAB — BASIC METABOLIC PANEL: BUN: 22 mg/dL (ref 6–23)

## 2011-04-28 LAB — CBC WITH DIFFERENTIAL/PLATELET
Basophils Absolute: 0 10*3/uL (ref 0.0–0.1)
Basophils Relative: 0.4 % (ref 0.0–3.0)
Eosinophils Absolute: 0.3 10*3/uL (ref 0.0–0.7)
Eosinophils Relative: 3 % (ref 0.0–5.0)
HCT: 43.7 % (ref 36.0–46.0)
Lymphocytes Relative: 24.7 % (ref 12.0–46.0)
MCV: 89.2 fl (ref 78.0–100.0)
Monocytes Relative: 8.6 % (ref 3.0–12.0)
Neutrophils Relative %: 63.3 % (ref 43.0–77.0)
RBC: 4.9 Mil/uL (ref 3.87–5.11)
WBC: 10.3 10*3/uL (ref 4.5–10.5)

## 2011-04-28 LAB — LIPID PANEL
Cholesterol: 152 mg/dL (ref 0–200)
HDL: 35.9 mg/dL — ABNORMAL LOW (ref >39.00)
LDL Cholesterol: 78 mg/dL (ref 0–99)
VLDL: 38 mg/dL (ref 0.0–40.0)

## 2011-04-28 LAB — HEPATIC FUNCTION PANEL
ALT: 21 U/L (ref 0–35)
AST: 21 U/L (ref 0–37)
Albumin: 3.9 g/dL (ref 3.5–5.2)
Total Bilirubin: 0.7 mg/dL (ref 0.3–1.2)
Total Protein: 7.5 g/dL (ref 6.0–8.3)

## 2011-04-28 LAB — TSH: TSH: 1.72 u[IU]/mL (ref 0.35–5.50)

## 2011-04-28 NOTE — Patient Instructions (Signed)
Patient was instructed to continue all medications as prescribed. To stop at the checkout desk and schedule a followup appointment  

## 2011-04-28 NOTE — Progress Notes (Signed)
Subjective:    Patient ID: Jenny [name], female    DOB: [birth year], 77 y.o.   MRN: [id number]  HPI Has gained weight This is a result of the elevated blood pressure she is followed for hyperlipidemia hypertension and a history of osteoarthritis.  I am concerned that with her weight gain and will complicate Jenny [name] both her osteoarthritic pain and risks for osteoporotic fracture as well as complicate her lipid and hypertensive control increasing her cardiovascular risk factors for heart attack and stroke  We spent much of the office visit counseling her about exercise and weight reduction as well as control of her chronic problems  She had one acute complaint today of an atopic rash on her hands that appears to be contact dermatitis  Review of Systems  Constitutional: Positive for activity change and unexpected weight change. Negative for appetite change and fatigue.  HENT: Negative for ear pain, congestion, neck pain, postnasal drip and sinus pressure.   Eyes: Negative for redness and visual disturbance.  Respiratory: Negative for cough, shortness of breath and wheezing.   Gastrointestinal: Negative for abdominal pain and abdominal distention.  Genitourinary: Negative for dysuria, frequency and menstrual problem.  Musculoskeletal: Negative for myalgias, joint swelling and arthralgias.  Skin: Positive for rash. Negative for wound.  Neurological: Negative for dizziness, weakness and headaches.  Hematological: Negative for adenopathy. Does not bruise/bleed easily.  Psychiatric/Behavioral: Negative for sleep disturbance and decreased concentration.   Past Medical History  Diagnosis Date  . Hypertension   . Osteoporosis   . Arthritis   . Hyperlipidemia     History   Social History  . Marital Status: Married    Spouse Name: N/A    Number of Children: N/A  . Years of Education: N/A   Occupational History  . Not on file.   Social History Main Topics  . Smoking status: Never  Smoker   . Smokeless tobacco: Not on file  . Alcohol Use: No  . Drug Use: No  . Sexually Active: Not Currently   Other Topics Concern  . Not on file   Social History Narrative  . No narrative on file    Past Surgical History  Procedure Date  . Appendectomy   . Abdominal hysterectomy   . Cholecystectomy     Family History  Problem Relation Age of Onset  . Prostate cancer Father   . Hypertension Father   . Parkinsonism Brother     No Known Allergies  Current Outpatient Prescriptions on File Prior to Visit  Medication Sig Dispense Refill  . amLODipine-olmesartan (AZOR) 5-40 MG per tablet Take 1 tablet by mouth daily.        . calcium-vitamin D (OSCAL WITH D 500-200) 500-200 MG-UNIT per tablet Take 1 tablet by mouth daily.        Marland Kitchen etodolac (LODINE) 400 MG tablet Take 400 mg by mouth daily.        . fish oil-omega-3 fatty acids 1000 MG capsule Take 2 g by mouth daily.        . Multiple Vitamin (MULTIVITAMIN) tablet Take 1 tablet by mouth daily.        . Vitamin D, Ergocalciferol, (DRISDOL) 50000 UNITS CAPS TAKE 1 CAPSULE EVERY WEEK  12 capsule  3    BP 136/86  Pulse 76  Temp 98.2 F (36.8 C)  Resp 16  Ht 5\' 5"  (1.651 m)  Wt 230 lb (104.327 kg)  BMI 38.27 kg/m2       Objective:  Physical Exam  Nursing note and vitals reviewed. Constitutional: She is oriented to person, place, and time. She appears well-developed and well-nourished. No distress.       Obese white female in no apparent distress  HENT:  Head: Normocephalic and atraumatic.  Right Ear: External ear normal.  Left Ear: External ear normal.  Nose: Nose normal.  Mouth/Throat: Oropharynx is clear and moist.  Eyes: Conjunctivae and EOM are normal. Pupils are equal, round, and reactive to light.  Neck: Normal range of motion. Neck supple. No JVD present. No tracheal deviation present. No thyromegaly present.  Cardiovascular: Normal rate, regular rhythm, normal heart sounds and intact distal pulses.     No murmur heard. Pulmonary/Chest: Effort normal and breath sounds normal. She has no wheezes. She exhibits no tenderness.  Abdominal: Soft. Bowel sounds are normal.  Musculoskeletal: Normal range of motion. She exhibits no edema and no tenderness.  Lymphadenopathy:    She has no cervical adenopathy.  Neurological: She is alert and oriented to person, place, and time. She has normal reflexes. No cranial nerve deficit.  Skin: Skin is warm and dry. She is not diaphoretic.  Psychiatric: She has a normal mood and affect. Her behavior is normal.          Assessment & Plan:  Blood pressure control and lipid control are stable continue her current medications and monitor labs as appropriate.  Osteoarthritic pain increased joint mobilization and exercise recommended as well as stretching ice and heat as needed.  Consider referral to orthopedic if pain persists.  Obesity. Diet counseling given  I have spent more than 30 minutes examining this patient face-to-face of which over half was spent in counseling

## 2011-06-09 ENCOUNTER — Other Ambulatory Visit: Payer: Self-pay | Admitting: Internal Medicine

## 2011-06-19 ENCOUNTER — Telehealth: Payer: Self-pay | Admitting: Internal Medicine

## 2011-06-19 MED ORDER — METHYLPREDNISOLONE (PAK) 4 MG PO TABS: 4.0000 mg | ORAL_TABLET | Freq: Every day | ORAL | Status: DC

## 2011-06-19 MED ORDER — AZITHROMYCIN 250 MG PO TABS: ORAL_TABLET | ORAL | Status: AC

## 2011-06-19 NOTE — Telephone Encounter (Signed)
Has a cough with hoarseness with wheezing at night x 2 weeks. Coughing up clear mucous. Please advise patient. [facility]. Thanks.

## 2011-06-19 NOTE — Telephone Encounter (Signed)
Rx sent and patient is aware. 

## 2011-06-19 NOTE — Telephone Encounter (Signed)
Per dr Lovell Sheehan- may have z pack and medrol 4 mg dose pack

## 2011-06-20 ENCOUNTER — Other Ambulatory Visit: Payer: Self-pay | Admitting: *Deleted

## 2011-06-20 MED ORDER — ALPRAZOLAM 0.25 MG PO TABS: 0.2500 mg | ORAL_TABLET | Freq: Every evening | ORAL | Status: DC | PRN

## 2011-06-26 ENCOUNTER — Telehealth: Payer: Self-pay | Admitting: *Deleted

## 2011-06-26 NOTE — Telephone Encounter (Signed)
Pt called and reports improvement of symptoms. Cough now with clear mucus has improved but still persists. Completed z pack yesterday.  Wants to know if she should have another round of antibiotics? Please advise.

## 2011-06-26 NOTE — Telephone Encounter (Signed)
Pt informed z pack continues to work- may use mucinex dm prn for cough- call if not better

## 2011-07-28 ENCOUNTER — Encounter: Payer: Self-pay | Admitting: Internal Medicine

## 2011-07-28 ENCOUNTER — Ambulatory Visit (INDEPENDENT_AMBULATORY_CARE_PROVIDER_SITE_OTHER): Payer: Medicare Other | Admitting: Internal Medicine

## 2011-07-28 VITALS — BP 138/80 | HR 76 | Temp 98.2°F | Resp 16 | Ht 65.0 in | Wt 230.0 lb

## 2011-07-28 DIAGNOSIS — I10 Essential (primary) hypertension: Secondary | ICD-10-CM | POA: Diagnosis not present

## 2011-07-28 DIAGNOSIS — J069 Acute upper respiratory infection, unspecified: Secondary | ICD-10-CM

## 2011-07-28 DIAGNOSIS — E785 Hyperlipidemia, unspecified: Secondary | ICD-10-CM

## 2011-07-28 DIAGNOSIS — M1990 Unspecified osteoarthritis, unspecified site: Secondary | ICD-10-CM | POA: Diagnosis not present

## 2011-07-28 NOTE — Progress Notes (Signed)
  Subjective:    Patient ID: Jenny [name], female    DOB: Jul 01, 1934, 78 y.o.   MRN: [id number]  HPI The pt had a viral URI with cough and bronchospasms.  She was treated with a Z-Pak and a prednisone Dosepak and the symptoms resolved.  The patient also noticed while she was on the prednisone Dosepak that she had a significant improvement in her arthritic pain.  The patient is followed for hypertension osteoarthritis osteopenia.  Patient's blood pressure stable today. Laboratory results were reviewed with the patient Liver and renal panels were normal total cholesterol was within goal CBC differential is normal    Review of Systems  Constitutional: Negative for activity change, appetite change and fatigue.  HENT: Negative for ear pain, congestion, neck pain, postnasal drip and sinus pressure.   Eyes: Negative for redness and visual disturbance.  Respiratory: Negative for cough, shortness of breath and wheezing.   Gastrointestinal: Negative for abdominal pain and abdominal distention.  Genitourinary: Negative for dysuria, frequency and menstrual problem.  Musculoskeletal: Negative for myalgias, joint swelling and arthralgias.  Skin: Negative for rash and wound.  Neurological: Negative for dizziness, weakness and headaches.  Hematological: Negative for adenopathy. Does not bruise/bleed easily.  Psychiatric/Behavioral: Negative for sleep disturbance and decreased concentration.       Objective:   Physical Exam  Nursing note and vitals reviewed. Constitutional: She is oriented to person, place, and time. She appears well-developed and well-nourished. No distress.  HENT:  Head: Normocephalic and atraumatic.  Right Ear: External ear normal.  Left Ear: External ear normal.  Nose: Nose normal.  Mouth/Throat: Oropharynx is clear and moist.  Eyes: Conjunctivae and EOM are normal. Pupils are equal, round, and reactive to light.  Neck: Normal range of motion. Neck supple. No JVD  present. No tracheal deviation present. No thyromegaly present.  Cardiovascular: Normal rate, regular rhythm, normal heart sounds and intact distal pulses.   No murmur heard. Pulmonary/Chest: Effort normal and breath sounds normal. She has no wheezes. She exhibits no tenderness.  Abdominal: Soft. Bowel sounds are normal.  Musculoskeletal: Normal range of motion. She exhibits no edema and no tenderness.  Lymphadenopathy:    She has no cervical adenopathy.  Neurological: She is alert and oriented to person, place, and time. She has normal reflexes. No cranial nerve deficit.  Skin: Skin is warm and dry. She is not diaphoretic.  Psychiatric: She has a normal mood and affect. Her behavior is normal.          Assessment & Plan:  The patient's blood pressure is stable on her current medication regimen.  Her cholesterol total is at goal at her age the cholesterol is good for her in context of her past medical history.  She has persistent osteoarthritic pain that improved while on steroids long-term steroid use would be contraindicated.  Her asthmatic bronchitis has resolved

## 2011-07-28 NOTE — Patient Instructions (Signed)
Get mucinex fast max liguid  Cough and cold

## 2011-09-28 ENCOUNTER — Other Ambulatory Visit: Payer: Self-pay | Admitting: Internal Medicine

## 2011-10-09 ENCOUNTER — Other Ambulatory Visit: Payer: Self-pay | Admitting: Internal Medicine

## 2011-11-24 ENCOUNTER — Encounter: Payer: Self-pay | Admitting: Internal Medicine

## 2011-11-24 ENCOUNTER — Ambulatory Visit (INDEPENDENT_AMBULATORY_CARE_PROVIDER_SITE_OTHER): Payer: Medicare Other | Admitting: Internal Medicine

## 2011-11-24 VITALS — BP 140/76 | HR 76 | Temp 98.2°F | Resp 16 | Ht 65.5 in | Wt 233.0 lb

## 2011-11-24 DIAGNOSIS — I10 Essential (primary) hypertension: Secondary | ICD-10-CM | POA: Diagnosis not present

## 2011-11-24 DIAGNOSIS — S336XXA Sprain of sacroiliac joint, initial encounter: Secondary | ICD-10-CM

## 2011-11-24 DIAGNOSIS — E785 Hyperlipidemia, unspecified: Secondary | ICD-10-CM

## 2011-11-24 NOTE — Progress Notes (Addendum)
Subjective:    Patient ID: Jenny [name], female    DOB: April 09, 1934, 78 y.o.   MRN: [id number]  HPI Patient is a 78 year old white female who presents for followup of hypertension and weight gain hyperlipidemia.  She has significant problems with her low back in the lumbar region with radiation into her hips bilaterally she states that this interferes with her ability to walk any length of time.  He has continued to gain weight and is now just a few percentage points from being obese we discussed the impact of morbid obesity on both her blood pressure her longevity and her osteoarthritis.     Review of Systems  Constitutional: Negative for activity change, appetite change and fatigue.  HENT: Negative for ear pain, congestion, neck pain, postnasal drip and sinus pressure.   Eyes: Negative for redness and visual disturbance.  Respiratory: Negative for cough, shortness of breath and wheezing.   Gastrointestinal: Negative for abdominal pain and abdominal distention.  Genitourinary: Negative for dysuria, frequency and menstrual problem.  Musculoskeletal: Positive for back pain, joint swelling and gait problem. Negative for myalgias and arthralgias.  Skin: Negative for rash and wound.  Neurological: Negative for dizziness, weakness and headaches.  Hematological: Negative for adenopathy. Does not bruise/bleed easily.  Psychiatric/Behavioral: Negative for sleep disturbance and decreased concentration.   Past Medical History  Diagnosis Date  . Hypertension   . Osteoporosis   . Arthritis   . Hyperlipidemia     History   Social History  . Marital Status: Married    Spouse Name: N/A    Number of Children: N/A  . Years of Education: N/A   Occupational History  . Not on file.   Social History Main Topics  . Smoking status: Never Smoker   . Smokeless tobacco: Not on file  . Alcohol Use: No  . Drug Use: No  . Sexually Active: Not Currently   Other Topics Concern  . Not on file     Social History Narrative  . No narrative on file    Past Surgical History  Procedure Date  . Appendectomy   . Abdominal hysterectomy   . Cholecystectomy     Family History  Problem Relation Age of Onset  . Prostate cancer Father   . Hypertension Father   . Parkinsonism Brother     No Known Allergies  Current Outpatient Prescriptions on File Prior to Visit  Medication Sig Dispense Refill  . ALPRAZolam (XANAX) 0.25 MG tablet Take 1 tablet (0.25 mg total) by mouth at bedtime as needed.  30 tablet  3  . amLODipine-olmesartan (AZOR) 5-40 MG per tablet Take 1 tablet by mouth daily.        . bisoprolol-hydrochlorothiazide (ZIAC) 5-6.25 MG per tablet TAKE ONE TABLET BY MOUTH EVERY DAY  30 tablet  11  . calcium-vitamin D (OSCAL WITH D 500-200) 500-200 MG-UNIT per tablet Take 1 tablet by mouth daily.        Marland Kitchen etodolac (LODINE) 400 MG tablet TAKE 1 TABLET DAILY  90 tablet  3  . Multiple Vitamin (MULTIVITAMIN) tablet Take 1 tablet by mouth daily.        . Vitamin D, Ergocalciferol, (DRISDOL) 50000 UNITS CAPS TAKE 1 CAPSULE EVERY WEEK  12 capsule  2    BP 140/76  Pulse 76  Temp 98.2 F (36.8 C)  Resp 16  Ht 5' 5.5" (1.664 m)  Wt 233 lb (105.688 kg)  BMI 38.18 kg/m2       Objective:  Physical Exam  Nursing note and vitals reviewed. Constitutional: She is oriented to person, place, and time. She appears well-developed and well-nourished. No distress.  HENT:  Head: Normocephalic and atraumatic.  Right Ear: External ear normal.  Left Ear: External ear normal.  Nose: Nose normal.  Mouth/Throat: Oropharynx is clear and moist.  Eyes: Conjunctivae and EOM are normal. Pupils are equal, round, and reactive to light.  Neck: Normal range of motion. Neck supple. No JVD present. No tracheal deviation present. No thyromegaly present.  Cardiovascular: Normal rate, regular rhythm, normal heart sounds and intact distal pulses.   No murmur heard. Pulmonary/Chest: Effort normal and  breath sounds normal. She has no wheezes. She exhibits no tenderness.  Abdominal: Soft. Bowel sounds are normal.  Musculoskeletal: Normal range of motion. She exhibits no edema and no tenderness.  Lymphadenopathy:    She has no cervical adenopathy.  Neurological: She is alert and oriented to person, place, and time. She has normal reflexes. No cranial nerve deficit.  Skin: Skin is warm and dry. She is not diaphoretic.  Psychiatric: She has a normal mood and affect. Her behavior is normal.          Assessment & Plan:  We discussed a weight loss program such as the DAS H. diet we discussed exercise on a regular basis starting with 15 minutes of walking or riding a stationary bike which she has in her house.  We discussed caloric limitation including portion control. We spent more than 30 minutes face-to-face counseling the patient.  Her blood pressure is stable but borderline on current medications we'll make no changes at this time due to our reliance on her weight loss is the primary intervention.

## 2011-11-24 NOTE — Patient Instructions (Signed)
"  DASH diet" Bike or walk 15 min every day

## 2011-11-24 NOTE — Progress Notes (Signed)
Addended by: Stacie Glaze on: 11/24/2011 02:40 PM   Modules accepted: Level of Service

## 2011-11-24 NOTE — Progress Notes (Signed)
Subjective:    Patient ID: Jenny [name], female    DOB: 1934-07-18, 78 y.o.   MRN: [id number]  HPI    Review of Systems  Constitutional: Negative for activity change, appetite change and fatigue.  HENT: Negative for ear pain, congestion, neck pain, postnasal drip and sinus pressure.   Eyes: Negative for redness and visual disturbance.  Respiratory: Negative for cough, shortness of breath and wheezing.   Gastrointestinal: Negative for abdominal pain and abdominal distention.  Genitourinary: Negative for dysuria, frequency and menstrual problem.  Musculoskeletal: Negative for myalgias, joint swelling and arthralgias.  Skin: Negative for rash and wound.  Neurological: Negative for dizziness, weakness and headaches.  Hematological: Negative for adenopathy. Does not bruise/bleed easily.  Psychiatric/Behavioral: Negative for sleep disturbance and decreased concentration.   Past Medical History  Diagnosis Date  . Hypertension   . Osteoporosis   . Arthritis   . Hyperlipidemia     History   Social History  . Marital Status: Married    Spouse Name: N/A    Number of Children: N/A  . Years of Education: N/A   Occupational History  . Not on file.   Social History Main Topics  . Smoking status: Never Smoker   . Smokeless tobacco: Not on file  . Alcohol Use: No  . Drug Use: No  . Sexually Active: Not Currently   Other Topics Concern  . Not on file   Social History Narrative  . No narrative on file    Past Surgical History  Procedure Date  . Appendectomy   . Abdominal hysterectomy   . Cholecystectomy     Family History  Problem Relation Age of Onset  . Prostate cancer Father   . Hypertension Father   . Parkinsonism Brother     No Known Allergies  Current Outpatient Prescriptions on File Prior to Visit  Medication Sig Dispense Refill  . ALPRAZolam (XANAX) 0.25 MG tablet Take 1 tablet (0.25 mg total) by mouth at bedtime as needed.  30 tablet  3  .  amLODipine-olmesartan (AZOR) 5-40 MG per tablet Take 1 tablet by mouth daily.        . bisoprolol-hydrochlorothiazide (ZIAC) 5-6.25 MG per tablet TAKE ONE TABLET BY MOUTH EVERY DAY  30 tablet  11  . calcium-vitamin D (OSCAL WITH D 500-200) 500-200 MG-UNIT per tablet Take 1 tablet by mouth daily.        Marland Kitchen etodolac (LODINE) 400 MG tablet TAKE 1 TABLET DAILY  90 tablet  3  . Multiple Vitamin (MULTIVITAMIN) tablet Take 1 tablet by mouth daily.        . Vitamin D, Ergocalciferol, (DRISDOL) 50000 UNITS CAPS TAKE 1 CAPSULE EVERY WEEK  12 capsule  2    BP 140/76  Pulse 76  Temp 98.2 F (36.8 C)  Resp 16  Ht 5' 5.5" (1.664 m)  Wt 233 lb (105.688 kg)  BMI 38.18 kg/m2         Objective:   Physical Exam  Nursing note and vitals reviewed. Constitutional: She is oriented to person, place, and time. She appears well-developed and well-nourished. No distress.  HENT:  Head: Normocephalic and atraumatic.  Right Ear: External ear normal.  Left Ear: External ear normal.  Nose: Nose normal.  Mouth/Throat: Oropharynx is clear and moist.  Eyes: Conjunctivae and EOM are normal. Pupils are equal, round, and reactive to light.  Neck: Normal range of motion. Neck supple. No JVD present. No tracheal deviation present. No thyromegaly present.  Cardiovascular: Normal  rate, regular rhythm, normal heart sounds and intact distal pulses.   No murmur heard. Pulmonary/Chest: Effort normal and breath sounds normal. She has no wheezes. She exhibits no tenderness.  Abdominal: Soft. Bowel sounds are normal.  Musculoskeletal: Normal range of motion. She exhibits no edema and no tenderness.  Lymphadenopathy:    She has no cervical adenopathy.  Neurological: She is alert and oriented to person, place, and time. She has normal reflexes. No cranial nerve deficit.  Skin: Skin is warm and dry. She is not diaphoretic.  Psychiatric: She has a normal mood and affect. Her behavior is normal.          Assessment &  Plan:  Weight gain and HTN follow up Not walking due to back and hip pain Use the stationary bike Limit gluten

## 2012-02-27 ENCOUNTER — Ambulatory Visit (INDEPENDENT_AMBULATORY_CARE_PROVIDER_SITE_OTHER): Payer: Medicare Other | Admitting: Internal Medicine

## 2012-02-27 ENCOUNTER — Encounter: Payer: Self-pay | Admitting: Internal Medicine

## 2012-02-27 VITALS — BP 136/76 | HR 72 | Temp 98.7°F | Resp 18 | Ht 65.0 in | Wt 232.0 lb

## 2012-02-27 DIAGNOSIS — M1990 Unspecified osteoarthritis, unspecified site: Secondary | ICD-10-CM

## 2012-02-27 DIAGNOSIS — E669 Obesity, unspecified: Secondary | ICD-10-CM

## 2012-02-27 DIAGNOSIS — I10 Essential (primary) hypertension: Secondary | ICD-10-CM | POA: Diagnosis not present

## 2012-02-27 NOTE — Progress Notes (Signed)
Subjective:    Patient ID: Jenny [name], female    DOB: 09-25-1933, 78 y.o.   MRN: [id number]  HPI  reviewed weight loss plans and HTN Increased osteoarthritis    Review of Systems  Constitutional: Negative for activity change, appetite change and fatigue.  HENT: Negative for ear pain, congestion, neck pain, postnasal drip and sinus pressure.   Eyes: Negative for redness and visual disturbance.  Respiratory: Negative for cough, shortness of breath and wheezing.   Gastrointestinal: Negative for abdominal pain and abdominal distention.  Genitourinary: Negative for dysuria, frequency and menstrual problem.  Musculoskeletal: Negative for myalgias, joint swelling and arthralgias.  Skin: Negative for rash and wound.  Neurological: Negative for dizziness, weakness and headaches.  Hematological: Negative for adenopathy. Does not bruise/bleed easily.  Psychiatric/Behavioral: Negative for disturbed wake/sleep cycle and decreased concentration.   Past Medical History  Diagnosis Date  . Hypertension   . Osteoporosis   . Arthritis   . Hyperlipidemia     History   Social History  . Marital Status: Married    Spouse Name: N/A    Number of Children: N/A  . Years of Education: N/A   Occupational History  . Not on file.   Social History Main Topics  . Smoking status: Never Smoker   . Smokeless tobacco: Not on file  . Alcohol Use: No  . Drug Use: No  . Sexually Active: Not Currently   Other Topics Concern  . Not on file   Social History Narrative  . No narrative on file    Past Surgical History  Procedure Date  . Appendectomy   . Abdominal hysterectomy   . Cholecystectomy     Family History  Problem Relation Age of Onset  . Prostate cancer Father   . Hypertension Father   . Parkinsonism Brother     No Known Allergies  Current Outpatient Prescriptions on File Prior to Visit  Medication Sig Dispense Refill  . ALPRAZolam (XANAX) 0.25 MG tablet Take 1 tablet (0.25  mg total) by mouth at bedtime as needed.  30 tablet  3  . amLODipine-olmesartan (AZOR) 5-40 MG per tablet Take 1 tablet by mouth daily.        . bisoprolol-hydrochlorothiazide (ZIAC) 5-6.25 MG per tablet TAKE ONE TABLET BY MOUTH EVERY DAY  30 tablet  11  . calcium-vitamin D (OSCAL WITH D 500-200) 500-200 MG-UNIT per tablet Take 1 tablet by mouth daily.        Marland Kitchen etodolac (LODINE) 400 MG tablet TAKE 1 TABLET DAILY  90 tablet  3  . fish oil-omega-3 fatty acids 1000 MG capsule Take 2 g by mouth daily.      . Multiple Vitamin (MULTIVITAMIN) tablet Take 1 tablet by mouth daily.        . Vitamin D, Ergocalciferol, (DRISDOL) 50000 UNITS CAPS TAKE 1 CAPSULE EVERY WEEK  12 capsule  2    BP 136/76  Pulse 72  Temp 98.7 F (37.1 C)  Resp 18  Ht 5\' 5"  (1.651 m)  Wt 232 lb (105.235 kg)  BMI 38.61 kg/m2       Objective:   Physical Exam  Nursing note and vitals reviewed. Constitutional: She is oriented to person, place, and time. She appears well-developed and well-nourished. No distress.  HENT:  Head: Normocephalic and atraumatic.  Right Ear: External ear normal.  Left Ear: External ear normal.  Nose: Nose normal.  Mouth/Throat: Oropharynx is clear and moist.  Eyes: Conjunctivae and EOM are normal. Pupils are  equal, round, and reactive to light.  Neck: Normal range of motion. Neck supple. No JVD present. No tracheal deviation present. No thyromegaly present.  Cardiovascular: Normal rate, regular rhythm, normal heart sounds and intact distal pulses.   No murmur heard. Pulmonary/Chest: Effort normal and breath sounds normal. She has no wheezes. She exhibits no tenderness.  Abdominal: Soft. Bowel sounds are normal.  Musculoskeletal: Normal range of motion. She exhibits no edema and no tenderness.  Lymphadenopathy:    She has no cervical adenopathy.  Neurological: She is alert and oriented to person, place, and time. She has normal reflexes. No cranial nerve deficit.  Skin: Skin is warm and  dry. She is not diaphoretic.  Psychiatric: She has a normal mood and affect. Her behavior is normal.          Assessment & Plan:  HTn stable Reviewed weight loss and discussed gluten free diet OA stable on etodolac  I have spent more than 30 minutes examining this patient face-to-face of which over half was spent in counseling About weight and diet

## 2012-02-27 NOTE — Patient Instructions (Signed)
The patient is instructed to continue all medications as prescribed. Schedule followup with check out clerk upon leaving the clinic  

## 2012-03-29 ENCOUNTER — Telehealth: Payer: Self-pay | Admitting: Internal Medicine

## 2012-03-29 NOTE — Telephone Encounter (Signed)
Pt requesting samples of  Azor 5-40mg 

## 2012-03-29 NOTE — Telephone Encounter (Signed)
Pt informed ready for pick up 

## 2012-04-06 DIAGNOSIS — Z23 Encounter for immunization: Secondary | ICD-10-CM | POA: Diagnosis not present

## 2012-05-05 DIAGNOSIS — M5137 Other intervertebral disc degeneration, lumbosacral region: Secondary | ICD-10-CM | POA: Diagnosis not present

## 2012-05-05 DIAGNOSIS — M545 Low back pain: Secondary | ICD-10-CM | POA: Diagnosis not present

## 2012-05-05 DIAGNOSIS — M999 Biomechanical lesion, unspecified: Secondary | ICD-10-CM | POA: Diagnosis not present

## 2012-05-05 DIAGNOSIS — M4716 Other spondylosis with myelopathy, lumbar region: Secondary | ICD-10-CM | POA: Diagnosis not present

## 2012-05-07 DIAGNOSIS — M999 Biomechanical lesion, unspecified: Secondary | ICD-10-CM | POA: Diagnosis not present

## 2012-05-07 DIAGNOSIS — M4716 Other spondylosis with myelopathy, lumbar region: Secondary | ICD-10-CM | POA: Diagnosis not present

## 2012-05-07 DIAGNOSIS — M5137 Other intervertebral disc degeneration, lumbosacral region: Secondary | ICD-10-CM | POA: Diagnosis not present

## 2012-05-07 DIAGNOSIS — M545 Low back pain: Secondary | ICD-10-CM | POA: Diagnosis not present

## 2012-05-14 DIAGNOSIS — M4716 Other spondylosis with myelopathy, lumbar region: Secondary | ICD-10-CM | POA: Diagnosis not present

## 2012-05-14 DIAGNOSIS — M5137 Other intervertebral disc degeneration, lumbosacral region: Secondary | ICD-10-CM | POA: Diagnosis not present

## 2012-05-14 DIAGNOSIS — M545 Low back pain: Secondary | ICD-10-CM | POA: Diagnosis not present

## 2012-05-14 DIAGNOSIS — M999 Biomechanical lesion, unspecified: Secondary | ICD-10-CM | POA: Diagnosis not present

## 2012-05-16 ENCOUNTER — Other Ambulatory Visit: Payer: Self-pay | Admitting: Internal Medicine

## 2012-05-24 ENCOUNTER — Ambulatory Visit (INDEPENDENT_AMBULATORY_CARE_PROVIDER_SITE_OTHER): Payer: Medicare Other | Admitting: Internal Medicine

## 2012-05-24 ENCOUNTER — Encounter: Payer: Self-pay | Admitting: Internal Medicine

## 2012-05-24 VITALS — BP 110/78 | HR 68 | Temp 98.2°F | Resp 16 | Ht 65.0 in | Wt 220.0 lb

## 2012-05-24 DIAGNOSIS — I10 Essential (primary) hypertension: Secondary | ICD-10-CM | POA: Diagnosis not present

## 2012-05-24 DIAGNOSIS — E785 Hyperlipidemia, unspecified: Secondary | ICD-10-CM | POA: Diagnosis not present

## 2012-05-24 DIAGNOSIS — Z23 Encounter for immunization: Secondary | ICD-10-CM

## 2012-05-24 DIAGNOSIS — T887XXA Unspecified adverse effect of drug or medicament, initial encounter: Secondary | ICD-10-CM

## 2012-05-24 DIAGNOSIS — M1990 Unspecified osteoarthritis, unspecified site: Secondary | ICD-10-CM

## 2012-05-24 DIAGNOSIS — Z0000 Encounter for general adult medical examination without abnormal findings: Secondary | ICD-10-CM | POA: Diagnosis not present

## 2012-05-24 LAB — CBC WITH DIFFERENTIAL/PLATELET
Eosinophils Relative: 2.4 % (ref 0.0–5.0)
HCT: 43.1 % (ref 36.0–46.0)
Hemoglobin: 14 g/dL (ref 12.0–15.0)
Lymphs Abs: 2.6 10*3/uL (ref 0.7–4.0)
MCV: 89.6 fl (ref 78.0–100.0)
Monocytes Absolute: 0.8 10*3/uL (ref 0.1–1.0)
Monocytes Relative: 7.7 % (ref 3.0–12.0)
Neutro Abs: 6.5 10*3/uL (ref 1.4–7.7)
RDW: 14.2 % (ref 11.5–14.6)
WBC: 10.1 10*3/uL (ref 4.5–10.5)

## 2012-05-24 LAB — HEPATIC FUNCTION PANEL
ALT: 26 U/L (ref 0–35)
Albumin: 3.9 g/dL (ref 3.5–5.2)
Bilirubin, Direct: 0.2 mg/dL (ref 0.0–0.3)
Total Protein: 7.3 g/dL (ref 6.0–8.3)

## 2012-05-24 LAB — BASIC METABOLIC PANEL
Calcium: 9.5 mg/dL (ref 8.4–10.5)
Chloride: 105 mEq/L (ref 96–112)
Creatinine, Ser: 0.9 mg/dL (ref 0.4–1.2)
GFR: 69.02 mL/min (ref >60.00)

## 2012-05-24 LAB — LIPID PANEL
HDL: 29.5 mg/dL — ABNORMAL LOW (ref >39.00)
Triglycerides: 243 mg/dL — ABNORMAL HIGH (ref 0.0–149.0)

## 2012-05-24 LAB — POCT URINALYSIS DIPSTICK
Ketones, UA: NEGATIVE
Spec Grav, UA: 1.015
pH, UA: 6

## 2012-05-24 NOTE — Progress Notes (Signed)
Subjective:    Patient ID: Jenny [name], female    DOB: Sep 02, 1933, 78 y.o.   MRN: [id number]  HPI Patient is a 78 year old female who presents for followup of hyperlipidemia hypertension and osteoarthritis who also has a history of osteoporosis.  She presents today for Medicare wellness examination followup for chronic medical conditions as well as weight loss and exercise protocol.  We have placed her on  a gluten-free diet she has not been able to keep gluten-free but has lost 12 pounds  Review of Systems  Constitutional: Negative for activity change, appetite change and fatigue.       Morbidly obese  HENT: Negative for ear pain, congestion, neck pain, postnasal drip and sinus pressure.   Eyes: Negative for redness and visual disturbance.  Respiratory: Negative for cough, shortness of breath and wheezing.   Gastrointestinal: Negative for abdominal pain and abdominal distention.  Genitourinary: Negative for dysuria, frequency and menstrual problem.  Musculoskeletal: Negative for myalgias, joint swelling and arthralgias.  Skin: Negative for rash and wound.  Neurological: Negative for dizziness, weakness and headaches.  Hematological: Negative for adenopathy. Does not bruise/bleed easily.  Psychiatric/Behavioral: Negative for sleep disturbance and decreased concentration.   Past Medical History  Diagnosis Date  . Hypertension   . Osteoporosis   . Arthritis   . Hyperlipidemia     History   Social History  . Marital Status: Married    Spouse Name: N/A    Number of Children: N/A  . Years of Education: N/A   Occupational History  . Not on file.   Social History Main Topics  . Smoking status: Never Smoker   . Smokeless tobacco: Not on file  . Alcohol Use: No  . Drug Use: No  . Sexually Active: Not Currently   Other Topics Concern  . Not on file   Social History Narrative  . No narrative on file    Past Surgical History  Procedure Date  . Appendectomy   .  Abdominal hysterectomy   . Cholecystectomy     Family History  Problem Relation Age of Onset  . Prostate cancer Father   . Hypertension Father   . Parkinsonism Brother     No Known Allergies  Current Outpatient Prescriptions on File Prior to Visit  Medication Sig Dispense Refill  . ALPRAZolam (XANAX) 0.25 MG tablet Take 1 tablet (0.25 mg total) by mouth at bedtime as needed.  30 tablet  3  . amLODipine-olmesartan (AZOR) 5-40 MG per tablet Take 1 tablet by mouth daily.        . bisoprolol-hydrochlorothiazide (ZIAC) 5-6.25 MG per tablet TAKE ONE TABLET BY MOUTH EVERY DAY  30 tablet  11  . calcium-vitamin D (OSCAL WITH D 500-200) 500-200 MG-UNIT per tablet Take 1 tablet by mouth daily.        Marland Kitchen etodolac (LODINE) 400 MG tablet TAKE 1 TABLET DAILY  90 tablet  3  . fish oil-omega-3 fatty acids 1000 MG capsule Take 2 g by mouth daily.      . Multiple Vitamin (MULTIVITAMIN) tablet Take 1 tablet by mouth daily.        . Vitamin D, Ergocalciferol, (DRISDOL) 50000 UNITS CAPS TAKE 1 CAPSULE EVERY WEEK  12 capsule  1    BP 110/78  Pulse 68  Temp 98.2 F (36.8 C)  Resp 16  Ht 5\' 5"  (1.651 m)  Wt 220 lb (99.791 kg)  BMI 36.61 kg/m2       Objective:   Physical Exam  Nursing note and vitals reviewed. Constitutional: She is oriented to person, place, and time. She appears well-developed and well-nourished. No distress.  HENT:  Head: Normocephalic and atraumatic.  Right Ear: External ear normal.  Left Ear: External ear normal.  Nose: Nose normal.  Mouth/Throat: Oropharynx is clear and moist.  Eyes: Conjunctivae normal and EOM are normal. Pupils are equal, round, and reactive to light.  Neck: Normal range of motion. Neck supple. No JVD present. No tracheal deviation present. No thyromegaly present.  Cardiovascular: Normal rate, regular rhythm and intact distal pulses.   Murmur heard. Pulmonary/Chest: Effort normal and breath sounds normal. She has no wheezes. She exhibits no  tenderness.  Abdominal: Soft. Bowel sounds are normal. She exhibits distension. There is tenderness.  Musculoskeletal: Normal range of motion. She exhibits edema. She exhibits no tenderness.  Lymphadenopathy:    She has no cervical adenopathy.  Neurological: She is alert and oriented to person, place, and time. She has normal reflexes. No cranial nerve deficit.  Skin: Skin is warm and dry. She is not diaphoretic.  Psychiatric: She has a normal mood and affect. Her behavior is normal.          Assessment & Plan:   patient's blood pressure is stable and improved after 12 pounds of weight loss.  We'll monitor a lipid panel today to assess her control of her hyperlipidemia and cardiovascular risks who also look at a basic metabolic panel for renal function and potassium we'll also check a CBC for her history of osteoarthritis and the use of nonsteroidals as a possible cause of blood loss anemia.  She will also have her Medicare wellness exam Subjective:    Jenny [name] is a 78 y.o. female who presents for Medicare Annual/Subsequent preventive examination.  Preventive Screening-Counseling & Management  Tobacco History  Smoking status  . Never Smoker   Smokeless tobacco  . Not on file     Problems Prior to Visit 1.   Current Problems (verified) Patient Active Problem List  Diagnosis  . HYPERLIPIDEMIA  . HYPERTENSION  . OSTEOARTHRITIS  . OSTEOPOROSIS  . SACROILIAC STRAIN  . ADVEF, DRUG/MEDICINAL/BIOLOGICAL SUBST NOS    Medications Prior to Visit Current Outpatient Prescriptions on File Prior to Visit  Medication Sig Dispense Refill  . ALPRAZolam (XANAX) 0.25 MG tablet Take 1 tablet (0.25 mg total) by mouth at bedtime as needed.  30 tablet  3  . amLODipine-olmesartan (AZOR) 5-40 MG per tablet Take 1 tablet by mouth daily.        . bisoprolol-hydrochlorothiazide (ZIAC) 5-6.25 MG per tablet TAKE ONE TABLET BY MOUTH EVERY DAY  30 tablet  11  . calcium-vitamin D (OSCAL  WITH D 500-200) 500-200 MG-UNIT per tablet Take 1 tablet by mouth daily.        Marland Kitchen etodolac (LODINE) 400 MG tablet TAKE 1 TABLET DAILY  90 tablet  3  . fish oil-omega-3 fatty acids 1000 MG capsule Take 2 g by mouth daily.      . Multiple Vitamin (MULTIVITAMIN) tablet Take 1 tablet by mouth daily.        . Vitamin D, Ergocalciferol, (DRISDOL) 50000 UNITS CAPS TAKE 1 CAPSULE EVERY WEEK  12 capsule  1    Current Medications (verified) Current Outpatient Prescriptions  Medication Sig Dispense Refill  . ALPRAZolam (XANAX) 0.25 MG tablet Take 1 tablet (0.25 mg total) by mouth at bedtime as needed.  30 tablet  3  . amLODipine-olmesartan (AZOR) 5-40 MG per tablet Take 1 tablet by  mouth daily.        . bisoprolol-hydrochlorothiazide (ZIAC) 5-6.25 MG per tablet TAKE ONE TABLET BY MOUTH EVERY DAY  30 tablet  11  . calcium-vitamin D (OSCAL WITH D 500-200) 500-200 MG-UNIT per tablet Take 1 tablet by mouth daily.        Marland Kitchen etodolac (LODINE) 400 MG tablet TAKE 1 TABLET DAILY  90 tablet  3  . fish oil-omega-3 fatty acids 1000 MG capsule Take 2 g by mouth daily.      . Multiple Vitamin (MULTIVITAMIN) tablet Take 1 tablet by mouth daily.        . Vitamin D, Ergocalciferol, (DRISDOL) 50000 UNITS CAPS TAKE 1 CAPSULE EVERY WEEK  12 capsule  1     Allergies (verified) Review of patient's allergies indicates no known allergies.   PAST HISTORY  Family History Family History  Problem Relation Age of Onset  . Prostate cancer Father   . Hypertension Father   . Parkinsonism Brother     Social History History  Substance Use Topics  . Smoking status: Never Smoker   . Smokeless tobacco: Not on file  . Alcohol Use: No     Are there smokers in your home (other than you)? No  Risk Factors Current exercise habits: The patient does not participate in regular exercise at present.  Dietary issues discussed: weight loss   Cardiac risk factors: advanced age (older than 7 for men, 109 for women), obesity (BMI >=  30 kg/m2) and sedentary lifestyle.  Depression Screen (Note: if answer to either of the following is "Yes", a more complete depression screening is indicated)   Over the past two weeks, have you felt down, depressed or hopeless? No  Over the past two weeks, have you felt little interest or pleasure in doing things? No  Have you lost interest or pleasure in daily life? No  Do you often feel hopeless? No  Do you cry easily over simple problems? No  Activities of Daily Living In your present state of health, do you have any difficulty performing the following activities?:  Driving? No Managing money?  No Feeding yourself? No Getting from bed to chair? No Climbing a flight of stairs? No Preparing food and eating?: No Bathing or showering? No Getting dressed: No Getting to the toilet? No Using the toilet:No Moving around from place to place: No In the past year have you fallen or had a near fall?:No   Are you sexually active?  No  Do you have more than one partner?  No  Hearing Difficulties: No Do you often ask people to speak up or repeat themselves? No Do you experience ringing or noises in your ears? No Do you have difficulty understanding soft or whispered voices? No   Do you feel that you have a problem with memory? No  Do you often misplace items? No  Do you feel safe at home?  No  Cognitive Testing  Alert? Yes  Normal Appearance?Yes  Oriented to person? Yes  Place? Yes   Time? Yes  Recall of three objects?  Yes  Can perform simple calculations? Yes  Displays appropriate judgment?Yes  Can read the correct time from a watch face?Yes   Advanced Directives have been discussed with the patient? Yes  List the Names of Other Physician/Practitioners you currently use: 1.    Indicate any recent Medical Services you may have received from other than [facility] providers in the past year (date may be approximate).  Immunization History  Administered Date(s) Administered  .  Influenza Split 04/14/2012  . Influenza Whole 04/05/2009, 04/21/2011  . Pneumococcal Polysaccharide 07/22/2001, 05/24/2012  . Td 07/21/2001  . Tdap 05/24/2012    Screening Tests Health Maintenance  Topic Date Due  . Colonoscopy  11/19/1985  . Zostavax  11/20/1995  . Tetanus/tdap  07/22/2011  . Influenza Vaccine  03/21/2012  . Pneumococcal Polysaccharide Vaccine Age 52 And Over  Completed    All answers were reviewed with the patient and necessary referrals were made:  Carrie Mew, MD   05/24/2012   History reviewed: allergies, current medications, past family history, past medical history, past social history, past surgical history and problem list  Review of Systems A comprehensive review of systems was negative.    Objective:     Vision by Snellen chart: right eye:20/20, left eye:20/20 corrected  Body mass index is 36.61 kg/(m^2). BP 110/78  Pulse 68  Temp 98.2 F (36.8 C)  Resp 16  Ht 5\' 5"  (1.651 m)  Wt 220 lb (99.791 kg)  BMI 36.61 kg/m2  No exam performed today, Exam was a part of the problem focused exam.     Assessment:      This is a routine physical examination for this healthy  Female. Reviewed all health maintenance protocols including mammography colonoscopy bone density and reviewed appropriate screening labs. Her immunization history was reviewed as well as her current medications and allergies refills of her chronic medications were given and the plan for yearly health maintenance was discussed all orders and referrals were made as appropriate.      Plan:     During the course of the visit the patient was educated and counseled about appropriate screening and preventive services including:    Influenza vaccine  Diabetes screening  Nutrition counseling   Diet review for nutrition referral? Yes __x__  Not Indicated ____   Patient Instructions (the written plan) was given to the patient.  Medicare Attestation I have personally  reviewed: The patient's medical and social history Their use of alcohol, tobacco or illicit drugs Their current medications and supplements The patient's functional ability including ADLs,fall risks, home safety risks, cognitive, and hearing and visual impairment Diet and physical activities Evidence for depression or mood disorders  The patient's weight, height, BMI, and visual acuity have been recorded in the chart.  I have made referrals, counseling, and provided education to the patient based on review of the above and I have provided the patient with a written personalized care plan for preventive services.     Carrie Mew, MD   05/24/2012

## 2012-05-28 DIAGNOSIS — M999 Biomechanical lesion, unspecified: Secondary | ICD-10-CM | POA: Diagnosis not present

## 2012-05-28 DIAGNOSIS — M545 Low back pain: Secondary | ICD-10-CM | POA: Diagnosis not present

## 2012-05-28 DIAGNOSIS — M5137 Other intervertebral disc degeneration, lumbosacral region: Secondary | ICD-10-CM | POA: Diagnosis not present

## 2012-05-28 DIAGNOSIS — M4716 Other spondylosis with myelopathy, lumbar region: Secondary | ICD-10-CM | POA: Diagnosis not present

## 2012-06-08 ENCOUNTER — Other Ambulatory Visit: Payer: Self-pay | Admitting: Internal Medicine

## 2012-06-11 ENCOUNTER — Telehealth: Payer: Self-pay | Admitting: Internal Medicine

## 2012-06-11 ENCOUNTER — Encounter: Payer: Self-pay | Admitting: Family Medicine

## 2012-06-11 ENCOUNTER — Ambulatory Visit (INDEPENDENT_AMBULATORY_CARE_PROVIDER_SITE_OTHER): Payer: Medicare Other | Admitting: Family Medicine

## 2012-06-11 VITALS — BP 138/82 | HR 83 | Temp 98.7°F | Wt 220.0 lb

## 2012-06-11 DIAGNOSIS — J069 Acute upper respiratory infection, unspecified: Secondary | ICD-10-CM

## 2012-06-11 DIAGNOSIS — J45909 Unspecified asthma, uncomplicated: Secondary | ICD-10-CM

## 2012-06-11 MED ORDER — AMOXICILLIN 875 MG PO TABS: 875.0000 mg | ORAL_TABLET | Freq: Two times a day (BID) | ORAL | Status: DC

## 2012-06-11 MED ORDER — ALBUTEROL SULFATE HFA 108 (90 BASE) MCG/ACT IN AERS: 2.0000 | INHALATION_SPRAY | Freq: Four times a day (QID) | RESPIRATORY_TRACT | Status: DC | PRN

## 2012-06-11 MED ORDER — ALBUTEROL SULFATE HFA 108 (90 BASE) MCG/ACT IN AERS
2.0000 | INHALATION_SPRAY | Freq: Four times a day (QID) | RESPIRATORY_TRACT | Status: DC | PRN
Start: 2012-06-11 — End: 2012-06-11

## 2012-06-11 MED ORDER — BENZONATATE 100 MG PO CAPS: 100.0000 mg | ORAL_CAPSULE | Freq: Three times a day (TID) | ORAL | Status: DC | PRN

## 2012-06-11 NOTE — Patient Instructions (Addendum)
Take medications according to instructions, let doctor know if any problems taking these medications.  Humidifier at night - clean according to instructions.  Follow up with your doctor if worsening or not improving over next several days.

## 2012-06-11 NOTE — Telephone Encounter (Signed)
Patient Information:  Caller Name: Kenzie Thoreson  Phone: [phone]  Patient: Jenny [name]  Gender: Female  DOB: 1933-10-14  Age: 78 Years  PCP: Darryll Capers (Adults only)   Symptoms  Reason For Call & Symptoms: wheezing/cold symptoms  Reviewed Health History In EMR: Yes  Reviewed Medications In EMR: Yes  Reviewed Allergies In EMR: Yes  Date of Onset of Symptoms: 06/04/2012  Guideline(s) Used:  Cough  Disposition Per Guideline:   Go to Office Now  Reason For Disposition Reached:   Wheezing is present  Advice Given:  N/A  Office Follow Up:  Does the office need to follow up with this patient?: Yes  Instructions For The Office: Patient requests Rx instead of office visit krs/can  Patient Refused Recommendation:  Patient Requests Prescription  Requests Rx instead of office visit krs/can  RN Note:  Patient declines recommended appt offered 06/11/12; states has been called in Rx for this cough in the past, even with wheeze.  Uses [facility].  May reach patient at [phone].  krs/can

## 2012-06-11 NOTE — Progress Notes (Signed)
Chief Complaint  Patient presents with  . cough and wheezing    cough x couple weeks; pt states she had a cold and was coughing up thick clear mucus.      HPI:  Here for acute visit for cough and cold symptoms and wheezing: -on [facility] seen for wellness exam this month with PCP -started: 2 weeks -symptoms: nasal congestion, drainage, cough, ? Wheezing - cold symptoms cleared up, but residual cough and wheeze -has tried:musinex and cough drops help -feels well -reports has had this happen in the past when she had a cold and took zpack and amoxicillin and steroids in the past -denies:fevers, SOB, NVD, sinus pain, fatigue, sinus pain or tooth pain   ROS: See pertinent positives and negatives per HPI.  Past Medical History  Diagnosis Date  . Hypertension   . Osteoporosis   . Arthritis   . Hyperlipidemia     Family History  Problem Relation Age of Onset  . Prostate cancer Father   . Hypertension Father   . Parkinsonism Brother     History   Social History  . Marital Status: Married    Spouse Name: N/A    Number of Children: N/A  . Years of Education: N/A   Social History Main Topics  . Smoking status: Never Smoker   . Smokeless tobacco: None  . Alcohol Use: No  . Drug Use: No  . Sexually Active: Not Currently   Other Topics Concern  . None   Social History Narrative  . None    Current outpatient prescriptions:ALPRAZolam (XANAX) 0.25 MG tablet, TAKE 1 TABLET AS DIRECTED EVERY 6 HOURS AS NEEDED, Disp: 30 tablet, Rfl: 3;  amLODipine-olmesartan (AZOR) 5-40 MG per tablet, Take 1 tablet by mouth daily.  , Disp: , Rfl: ;  bisoprolol-hydrochlorothiazide (ZIAC) 5-6.25 MG per tablet, TAKE ONE TABLET BY MOUTH EVERY DAY, Disp: 30 tablet, Rfl: 11 calcium-vitamin D (OSCAL WITH D 500-200) 500-200 MG-UNIT per tablet, Take 1 tablet by mouth daily.  , Disp: , Rfl: ;  etodolac (LODINE) 400 MG tablet, TAKE 1 TABLET DAILY, Disp: 90 tablet, Rfl: 3;  fish oil-omega-3 fatty acids 1000 MG  capsule, Take 2 g by mouth daily., Disp: , Rfl: ;  Multiple Vitamin (MULTIVITAMIN) tablet, Take 1 tablet by mouth daily.  , Disp: , Rfl:  Vitamin D, Ergocalciferol, (DRISDOL) 50000 UNITS CAPS, TAKE 1 CAPSULE EVERY WEEK, Disp: 12 capsule, Rfl: 1;  albuterol (PROVENTIL HFA;VENTOLIN HFA) 108 (90 BASE) MCG/ACT inhaler, Inhale 2 puffs into the lungs every 6 (six) hours as needed for wheezing., Disp: 1 Inhaler, Rfl: 0;  amoxicillin (AMOXIL) 875 MG tablet, Take 1 tablet (875 mg total) by mouth 2 (two) times daily., Disp: 20 tablet, Rfl: 0 benzonatate (TESSALON PERLES) 100 MG capsule, Take 1 capsule (100 mg total) by mouth 3 (three) times daily as needed for cough., Disp: 20 capsule, Rfl: 0  EXAM:  Filed Vitals:   06/11/12 1525  BP: 138/82  Pulse: 83  Temp: 98.7 F (37.1 C)  O2 sats 94% on RA  There is no height on file to calculate BMI.  GENERAL: vitals reviewed and listed above, alert, oriented, appears well hydrated and in no acute distress  HEENT: atraumatic, conjunttiva clear, no obvious abnormalities on inspection of external nose and ears, ear canals and TMs normal, white nasal congestion with erythema of nasal and post oropharyngeal mucosa, no sinus TTP  NECK: no obvious masses on inspection  LUNGS: clear to auscultation bilaterally, no wheezes, rales or  rhonchi, good air movement  CV: HRRR, no peripheral edema  MS: moves all extremities without noticeable abnormality  PSYCH: pleasant and cooperative, no obvious depression or anxiety  ASSESSMENT AND PLAN:  Discussed the following assessment and plan:  1. Upper respiratory infection  amoxicillin (AMOXIL) 875 MG tablet, benzonatate (TESSALON PERLES) 100 MG capsule  2. Reactive airway disease  albuterol (PROVENTIL HFA;VENTOLIN HFA) 108 (90 BASE) MCG/ACT inhaler, benzonatate (TESSALON PERLES) 100 MG capsule, DISCONTINUED: albuterol (PROVENTIL HFA;VENTOLIN HFA) 108 (90 BASE) MCG/ACT inhaler   -likely viral URI with post viral RAD,  however given ongoing symptoms and findings on exam discussed that abx reasonable, discussed risks. Albuterol for mild RAD. Cough medication, discussed risks. -Patient advised to return or notify a doctor immediately if symptoms worsen or persist or new concerns arise.  Patient Instructions  Take medications according to instructions, let doctor know if any problems taking these medications.  Humidifier at night - clean according to instructions.  Follow up with your doctor if worsening or not improving over next several days.     Kriste Basque R.

## 2012-06-11 NOTE — Telephone Encounter (Signed)
Ov with dr Selena Batten this pm

## 2012-06-20 ENCOUNTER — Other Ambulatory Visit: Payer: Self-pay | Admitting: Internal Medicine

## 2012-07-23 ENCOUNTER — Other Ambulatory Visit: Payer: Self-pay | Admitting: Internal Medicine

## 2012-07-30 DIAGNOSIS — M4716 Other spondylosis with myelopathy, lumbar region: Secondary | ICD-10-CM | POA: Diagnosis not present

## 2012-07-30 DIAGNOSIS — M5137 Other intervertebral disc degeneration, lumbosacral region: Secondary | ICD-10-CM | POA: Diagnosis not present

## 2012-07-30 DIAGNOSIS — M545 Low back pain: Secondary | ICD-10-CM | POA: Diagnosis not present

## 2012-07-30 DIAGNOSIS — M999 Biomechanical lesion, unspecified: Secondary | ICD-10-CM | POA: Diagnosis not present

## 2012-08-05 ENCOUNTER — Telehealth: Payer: Self-pay | Admitting: Internal Medicine

## 2012-08-05 NOTE — Telephone Encounter (Signed)
Pt says MD gives her samples of the amLODipine-olmesartan (AZOR) 5-40 MG per tablet.  Pt has 6 pills left and her appt is not until March. Pls advise. OK to leave message on machine. Marland Kitchen

## 2012-08-05 NOTE — Telephone Encounter (Signed)
.  lmom Samples up front

## 2012-08-06 DIAGNOSIS — M545 Low back pain: Secondary | ICD-10-CM | POA: Diagnosis not present

## 2012-08-06 DIAGNOSIS — M999 Biomechanical lesion, unspecified: Secondary | ICD-10-CM | POA: Diagnosis not present

## 2012-08-06 DIAGNOSIS — M5137 Other intervertebral disc degeneration, lumbosacral region: Secondary | ICD-10-CM | POA: Diagnosis not present

## 2012-08-06 DIAGNOSIS — M4716 Other spondylosis with myelopathy, lumbar region: Secondary | ICD-10-CM | POA: Diagnosis not present

## 2012-08-27 DIAGNOSIS — M5137 Other intervertebral disc degeneration, lumbosacral region: Secondary | ICD-10-CM | POA: Diagnosis not present

## 2012-08-27 DIAGNOSIS — M4716 Other spondylosis with myelopathy, lumbar region: Secondary | ICD-10-CM | POA: Diagnosis not present

## 2012-08-27 DIAGNOSIS — M999 Biomechanical lesion, unspecified: Secondary | ICD-10-CM | POA: Diagnosis not present

## 2012-08-27 DIAGNOSIS — M545 Low back pain: Secondary | ICD-10-CM | POA: Diagnosis not present

## 2012-08-29 ENCOUNTER — Other Ambulatory Visit: Payer: Self-pay | Admitting: Internal Medicine

## 2012-08-31 ENCOUNTER — Other Ambulatory Visit: Payer: Self-pay | Admitting: Internal Medicine

## 2012-09-09 ENCOUNTER — Telehealth: Payer: Self-pay | Admitting: Internal Medicine

## 2012-09-09 MED ORDER — AMLODIPINE-OLMESARTAN 5-40 MG PO TABS: 1.0000 | ORAL_TABLET | Freq: Every day | ORAL | Status: DC

## 2012-09-09 NOTE — Telephone Encounter (Signed)
Patient called stating that she would like refills fo azor 5/10mg  1poqd. Patient only needs 12 pills. Please assist.

## 2012-09-10 ENCOUNTER — Other Ambulatory Visit: Payer: Self-pay | Admitting: Internal Medicine

## 2012-09-22 ENCOUNTER — Encounter: Payer: Self-pay | Admitting: Internal Medicine

## 2012-09-22 ENCOUNTER — Ambulatory Visit (INDEPENDENT_AMBULATORY_CARE_PROVIDER_SITE_OTHER): Payer: Medicare Other | Admitting: Internal Medicine

## 2012-09-22 VITALS — BP 140/84 | HR 76 | Temp 98.2°F | Resp 16 | Ht 65.0 in | Wt 216.0 lb

## 2012-09-22 DIAGNOSIS — I10 Essential (primary) hypertension: Secondary | ICD-10-CM

## 2012-09-22 DIAGNOSIS — E785 Hyperlipidemia, unspecified: Secondary | ICD-10-CM | POA: Diagnosis not present

## 2012-09-22 NOTE — Patient Instructions (Signed)
The patient is instructed to continue all medications as prescribed. Schedule followup with check out clerk upon leaving the clinic  

## 2012-09-22 NOTE — Progress Notes (Signed)
  Subjective:    Patient ID: Jenny [name], female    DOB: 1934/07/14, 79 y.o.   MRN: [id number]  HPI This is a 79 year old female is followed for hypertension osteoarthritis and hyperlipidemia.  She had blood work done her last visit which reviewed her renal function is normal  Her screen for diabetes was negative her total cholesterol was in the 160 range with an LDL cholesterol less than 409 and her blood pressure has been well-controlled.  She stable her current medications she is progressive plan to weight loss which is a part of our overall strategy for maintaining health   Review of Systems  Constitutional: Negative for activity change, appetite change and fatigue.  HENT: Negative for ear pain, congestion, neck pain, postnasal drip and sinus pressure.   Eyes: Negative for redness and visual disturbance.  Respiratory: Negative for cough, shortness of breath and wheezing.   Gastrointestinal: Negative for abdominal pain and abdominal distention.  Genitourinary: Negative for dysuria, frequency and menstrual problem.  Musculoskeletal: Negative for myalgias, joint swelling and arthralgias.  Skin: Negative for rash and wound.  Neurological: Negative for dizziness, weakness and headaches.  Hematological: Negative for adenopathy. Does not bruise/bleed easily.  Psychiatric/Behavioral: Negative for sleep disturbance and decreased concentration.       Objective:   Physical Exam  Constitutional: She is oriented to person, place, and time. She appears well-developed and well-nourished. No distress.  HENT:  Head: Normocephalic and atraumatic.  Right Ear: External ear normal.  Left Ear: External ear normal.  Nose: Nose normal.  Mouth/Throat: Oropharynx is clear and moist.  Eyes: Conjunctivae and EOM are normal. Pupils are equal, round, and reactive to light.  Neck: Normal range of motion. Neck supple. No JVD present. No tracheal deviation present. No thyromegaly present.  Cardiovascular:  Normal rate, regular rhythm and intact distal pulses.   Murmur heard. Pulmonary/Chest: Effort normal and breath sounds normal. She has no wheezes. She exhibits no tenderness.  Abdominal: Soft. Bowel sounds are normal.  Musculoskeletal: Normal range of motion. She exhibits no edema and no tenderness.  Lymphadenopathy:    She has no cervical adenopathy.  Neurological: She is alert and oriented to person, place, and time. She has normal reflexes. No cranial nerve deficit.  Skin: Skin is warm and dry. She is not diaphoretic.  Psychiatric: She has a normal mood and affect. Her behavior is normal.          Assessment & Plan:  Stable blood pressure on medication but the medications is costly Samples given Stable lipids on intervention monitoring of blood work and prescriptions weight and diet reveiwed

## 2012-09-28 ENCOUNTER — Other Ambulatory Visit: Payer: Self-pay | Admitting: *Deleted

## 2012-09-28 MED ORDER — BISOPROLOL-HYDROCHLOROTHIAZIDE 5-6.25 MG PO TABS: 1.0000 | ORAL_TABLET | Freq: Every day | ORAL | Status: DC

## 2012-12-09 ENCOUNTER — Telehealth: Payer: Self-pay | Admitting: Internal Medicine

## 2012-12-09 NOTE — Telephone Encounter (Signed)
PT called to request samples of Azor. Please assist.

## 2012-12-10 NOTE — Telephone Encounter (Signed)
None avaiable- will call back next week

## 2012-12-15 ENCOUNTER — Other Ambulatory Visit: Payer: Self-pay | Admitting: *Deleted

## 2013-01-09 ENCOUNTER — Other Ambulatory Visit: Payer: Self-pay | Admitting: Internal Medicine

## 2013-01-24 ENCOUNTER — Encounter: Payer: Self-pay | Admitting: Internal Medicine

## 2013-01-24 ENCOUNTER — Ambulatory Visit (INDEPENDENT_AMBULATORY_CARE_PROVIDER_SITE_OTHER): Payer: Medicare Other | Admitting: Internal Medicine

## 2013-01-24 VITALS — BP 134/76 | HR 76 | Temp 98.2°F | Resp 16 | Ht 65.0 in | Wt 216.0 lb

## 2013-01-24 DIAGNOSIS — E785 Hyperlipidemia, unspecified: Secondary | ICD-10-CM | POA: Diagnosis not present

## 2013-01-24 DIAGNOSIS — M1990 Unspecified osteoarthritis, unspecified site: Secondary | ICD-10-CM

## 2013-01-24 DIAGNOSIS — I10 Essential (primary) hypertension: Secondary | ICD-10-CM | POA: Diagnosis not present

## 2013-01-24 NOTE — Progress Notes (Signed)
  Subjective:    Patient ID: Jenny [name], female    DOB: 1934/07/06, 79 y.o.   MRN: [id number]  HPI Is a 79 year old female who presents for followup of hypertension history of osteoarthritis and a history of hyperlipidemia she is due for her complete physical examination in 6 months with appropriate screening blood work for hypertension hyperlipidemia.  She presents today for followup of morbid obesity with a BMI of 36   Review of Systems  Constitutional: Positive for fatigue.  HENT: Negative.   Eyes: Negative.   Respiratory: Negative.   Cardiovascular: Negative.   Gastrointestinal: Negative.   Genitourinary: Negative.        Objective:   Physical Exam  Constitutional: She is oriented to person, place, and time. She appears well-developed and well-nourished. No distress.  Obese white female  HENT:  Head: Normocephalic and atraumatic.  Right Ear: External ear normal.  Eyes: Conjunctivae and EOM are normal. Pupils are equal, round, and reactive to light.  Neck: Normal range of motion. Neck supple. No JVD present. No tracheal deviation present. No thyromegaly present.  Cardiovascular: Normal rate and regular rhythm.   Murmur heard. Pulmonary/Chest: Effort normal and breath sounds normal. She has no wheezes. She exhibits no tenderness.  Abdominal: Soft. Bowel sounds are normal.  Musculoskeletal: Normal range of motion. She exhibits no edema and no tenderness.  Lymphadenopathy:    She has no cervical adenopathy.  Neurological: She is alert and oriented to person, place, and time. She has normal reflexes. No cranial nerve deficit.  Skin: Skin is warm and dry. She is not diaphoretic.  Psychiatric: She has a normal mood and affect. Her behavior is normal.          Assessment & Plan:  Morbid obesity.  Reviewed diet discussed the combination of exercise and caloric restriction recommended specifically gluten restriction as a methodology for helping her with her weight  loss.  Blood pressure stable on current medications screening blood work from previous visit reviewed with patient.  Lipid stable on current medications.  Osteoarthritis again the need for exercise was emphasized

## 2013-04-03 ENCOUNTER — Other Ambulatory Visit: Payer: Self-pay | Admitting: Internal Medicine

## 2013-04-15 ENCOUNTER — Telehealth: Payer: Self-pay | Admitting: Internal Medicine

## 2013-04-15 NOTE — Telephone Encounter (Signed)
Pt needs azor 5-40 mg samples

## 2013-04-15 NOTE — Telephone Encounter (Signed)
No samples available, pt wants to wait till Monday to see if we get any in.  Her last pill is on Monday.  Offered to call in rx but she states Dr Lovell Sheehan always gives her samples

## 2013-04-18 ENCOUNTER — Other Ambulatory Visit: Payer: Self-pay | Admitting: *Deleted

## 2013-04-18 NOTE — Telephone Encounter (Signed)
None available

## 2013-04-20 DIAGNOSIS — Z23 Encounter for immunization: Secondary | ICD-10-CM | POA: Diagnosis not present

## 2013-04-28 ENCOUNTER — Telehealth: Payer: Self-pay | Admitting: Internal Medicine

## 2013-04-28 MED ORDER — AMLODIPINE-OLMESARTAN 5-40 MG PO TABS: 1.0000 | ORAL_TABLET | Freq: Every day | ORAL | Status: DC

## 2013-04-28 NOTE — Telephone Encounter (Signed)
Still not available sent to pharmacy

## 2013-04-28 NOTE — Telephone Encounter (Signed)
Pt is calling to see if we have samples of amLODipine-olmesartan (AZOR) 5-40 MG per tablet. Please assist.

## 2013-05-17 ENCOUNTER — Other Ambulatory Visit: Payer: Self-pay | Admitting: Internal Medicine

## 2013-05-21 ENCOUNTER — Other Ambulatory Visit: Payer: Self-pay | Admitting: Internal Medicine

## 2013-07-29 ENCOUNTER — Encounter: Payer: Self-pay | Admitting: Internal Medicine

## 2013-07-29 ENCOUNTER — Ambulatory Visit (INDEPENDENT_AMBULATORY_CARE_PROVIDER_SITE_OTHER): Payer: Medicare Other | Admitting: Internal Medicine

## 2013-07-29 VITALS — BP 130/80 | HR 76 | Temp 98.2°F | Resp 16 | Ht 65.0 in | Wt 216.0 lb

## 2013-07-29 DIAGNOSIS — M810 Age-related osteoporosis without current pathological fracture: Secondary | ICD-10-CM

## 2013-07-29 DIAGNOSIS — E785 Hyperlipidemia, unspecified: Secondary | ICD-10-CM | POA: Diagnosis not present

## 2013-07-29 DIAGNOSIS — I10 Essential (primary) hypertension: Secondary | ICD-10-CM

## 2013-07-29 DIAGNOSIS — T887XXA Unspecified adverse effect of drug or medicament, initial encounter: Secondary | ICD-10-CM | POA: Diagnosis not present

## 2013-07-29 DIAGNOSIS — Z0000 Encounter for general adult medical examination without abnormal findings: Secondary | ICD-10-CM

## 2013-07-29 DIAGNOSIS — Z79899 Other long term (current) drug therapy: Secondary | ICD-10-CM | POA: Diagnosis not present

## 2013-07-29 LAB — CBC WITH DIFFERENTIAL/PLATELET
BASOS ABS: 0.1 10*3/uL (ref 0.0–0.1)
BASOS PCT: 1.2 % (ref 0.0–3.0)
EOS ABS: 0.2 10*3/uL (ref 0.0–0.7)
Eosinophils Relative: 2.4 % (ref 0.0–5.0)
HCT: 44.9 % (ref 36.0–46.0)
Hemoglobin: 14.9 g/dL (ref 12.0–15.0)
LYMPHS PCT: 24.4 % (ref 12.0–46.0)
Lymphs Abs: 2.1 10*3/uL (ref 0.7–4.0)
MCHC: 33.2 g/dL (ref 30.0–36.0)
MCV: 84.9 fl (ref 78.0–100.0)
Monocytes Absolute: 0.7 10*3/uL (ref 0.1–1.0)
Monocytes Relative: 8.3 % (ref 3.0–12.0)
NEUTROS ABS: 5.4 10*3/uL (ref 1.4–7.7)
NEUTROS PCT: 63.7 % (ref 43.0–77.0)
Platelets: 306 10*3/uL (ref 150.0–400.0)
RBC: 5.29 Mil/uL — ABNORMAL HIGH (ref 3.87–5.11)
RDW: 14.3 % (ref 11.5–14.6)
WBC: 8.5 10*3/uL (ref 4.5–10.5)

## 2013-07-29 LAB — HEPATIC FUNCTION PANEL
ALBUMIN: 4 g/dL (ref 3.5–5.2)
ALT: 33 U/L (ref 0–35)
AST: 29 U/L (ref 0–37)
Alkaline Phosphatase: 56 U/L (ref 39–117)
Bilirubin, Direct: 0.2 mg/dL (ref 0.0–0.3)
TOTAL PROTEIN: 7.5 g/dL (ref 6.0–8.3)
Total Bilirubin: 0.8 mg/dL (ref 0.3–1.2)

## 2013-07-29 LAB — BASIC METABOLIC PANEL
BUN: 13 mg/dL (ref 6–23)
CO2: 28 meq/L (ref 19–32)
CREATININE: 0.7 mg/dL (ref 0.4–1.2)
Calcium: 9.7 mg/dL (ref 8.4–10.5)
Chloride: 103 mEq/L (ref 96–112)
GFR: 80.74 mL/min (ref >60.00)
GLUCOSE: 84 mg/dL (ref 70–99)
Potassium: 4.1 mEq/L (ref 3.5–5.1)
Sodium: 141 mEq/L (ref 135–145)

## 2013-07-29 LAB — TSH: TSH: 0.96 u[IU]/mL (ref 0.35–5.50)

## 2013-07-29 LAB — LIPID PANEL
CHOLESTEROL: 170 mg/dL (ref 0–200)
HDL: 27.3 mg/dL — AB (ref >39.00)
Total CHOL/HDL Ratio: 6
Triglycerides: 238 mg/dL — ABNORMAL HIGH (ref 0.0–149.0)
VLDL: 47.6 mg/dL — AB (ref 0.0–40.0)

## 2013-07-29 LAB — LDL CHOLESTEROL, DIRECT: LDL DIRECT: 94.4 mg/dL

## 2013-07-29 NOTE — Progress Notes (Signed)
Subjective:    Patient ID: Jenny [name], female    DOB: 09/19/1933, 80 y.o.   MRN: [id number]  HPI Medicare wellness Blood pressure treatment plan updated, Lipid monitoring and treatment plan as well as OA management  On azor and ziac On fish oil for lipids Takes lodine for OA  Post viral cough   Review of Systems  Constitutional: Positive for fatigue and unexpected weight change. Negative for activity change and appetite change.  HENT: Positive for postnasal drip. Negative for congestion, ear pain and sinus pressure.   Eyes: Negative for redness and visual disturbance.  Respiratory: Negative for cough, shortness of breath and wheezing.   Gastrointestinal: Negative for abdominal pain and abdominal distention.  Genitourinary: Negative for dysuria, frequency and menstrual problem.  Musculoskeletal: Negative for arthralgias, joint swelling, myalgias and neck pain.  Skin: Negative for rash and wound.  Neurological: Negative for dizziness, weakness and headaches.  Hematological: Negative for adenopathy. Does not bruise/bleed easily.  Psychiatric/Behavioral: Negative for sleep disturbance and decreased concentration.   Past Medical History  Diagnosis Date  . Hypertension   . Osteoporosis   . Arthritis   . Hyperlipidemia     History   Social History  . Marital Status: Married    Spouse Name: N/A    Number of Children: N/A  . Years of Education: N/A   Occupational History  . Not on file.   Social History Main Topics  . Smoking status: Never Smoker   . Smokeless tobacco: Not on file  . Alcohol Use: No  . Drug Use: No  . Sexual Activity: Not Currently   Other Topics Concern  . Not on file   Social History Narrative  . No narrative on file    Past Surgical History  Procedure Laterality Date  . Appendectomy    . Abdominal hysterectomy    . Cholecystectomy      Family History  Problem Relation Age of Onset  . Prostate cancer Father   . Hypertension Father    . Parkinsonism Brother     No Known Allergies  Current Outpatient Prescriptions on File Prior to Visit  Medication Sig Dispense Refill  . ALPRAZolam (XANAX) 0.25 MG tablet TAKE 1 TABLET EVERY 6 HOURS AS NEEDED  30 tablet  3  . amLODipine-olmesartan (AZOR) 5-40 MG per tablet Take 1 tablet by mouth daily.  30 tablet  3  . bisoprolol-hydrochlorothiazide (ZIAC) 5-6.25 MG per tablet Take 1 tablet by mouth daily.  30 tablet  11  . calcium-vitamin D (OSCAL WITH D 500-200) 500-200 MG-UNIT per tablet Take 1 tablet by mouth daily.        Marland Kitchen. etodolac (LODINE) 400 MG tablet TAKE 1 TABLET DAILY  90 tablet  3  . fish oil-omega-3 fatty acids 1000 MG capsule Take 2 g by mouth daily.      . Multiple Vitamin (MULTIVITAMIN) tablet Take 1 tablet by mouth daily.        . Vitamin D, Ergocalciferol, (DRISDOL) 50000 UNITS CAPS capsule TAKE 1 CAPSULE EVERY WEEK  12 capsule  3   No current facility-administered medications on file prior to visit.    BP 130/80  Pulse 76  Temp(Src) 98.2 F (36.8 C)  Resp 16  Ht 5\' 5"  (1.651 m)  Wt 216 lb (97.977 kg)  BMI 35.94 kg/m2       Objective:   Physical Exam  Nursing note and vitals reviewed. Constitutional: She is oriented to person, place, and time. She appears well-developed.  HENT:  Head: Normocephalic and atraumatic.  Eyes: Conjunctivae are normal. Pupils are equal, round, and reactive to light.  Neck: Normal range of motion.  Cardiovascular: Normal rate and regular rhythm.   Murmur heard. Pulmonary/Chest: Effort normal and breath sounds normal.  Abdominal: Soft. Bowel sounds are normal.  Musculoskeletal: Normal range of motion.  Neurological: She is alert and oriented to person, place, and time.  Skin: Skin is dry.  Psychiatric: She has a normal mood and affect. Her behavior is normal.          Assessment & Plan:  chronic post viral cough  Will rx with tessilon perles stable hypertension we'll monitor a basic metabolic panel.  She is on  chronic nonsteroidals for osteoarthritis and should have a CBC and liver functions drawn.  She was on the omega-3 use for mild hyperlipidemia and should have a lipid panel drawn.  She also takes chronic vitamin D supplementation for osteopenia/senile osteoporosis and should have a vitamin D level and a calcium checked   Subjective:    Jenny [name] is a 80 y.o. female who presents for Medicare Annual/Subsequent preventive examination.  Preventive Screening-Counseling & Management  Tobacco History  Smoking status  . Never Smoker   Smokeless tobacco  . Not on file     Problems Prior to Visit 1.   Current Problems (verified) Patient Active Problem List   Diagnosis Date Noted  . Morbid obesity 01/24/2013  . SACROILIAC STRAIN 05/01/2009  . HYPERLIPIDEMIA 02/02/2007  . HYPERTENSION 02/02/2007  . OSTEOARTHRITIS 02/02/2007  . OSTEOPOROSIS 02/02/2007  . ADVEF, DRUG/MEDICINAL/BIOLOGICAL SUBST NOS 01/11/2007    Medications Prior to Visit Current Outpatient Prescriptions on File Prior to Visit  Medication Sig Dispense Refill  . ALPRAZolam (XANAX) 0.25 MG tablet TAKE 1 TABLET EVERY 6 HOURS AS NEEDED  30 tablet  3  . amLODipine-olmesartan (AZOR) 5-40 MG per tablet Take 1 tablet by mouth daily.  30 tablet  3  . bisoprolol-hydrochlorothiazide (ZIAC) 5-6.25 MG per tablet Take 1 tablet by mouth daily.  30 tablet  11  . calcium-vitamin D (OSCAL WITH D 500-200) 500-200 MG-UNIT per tablet Take 1 tablet by mouth daily.        Marland Kitchen etodolac (LODINE) 400 MG tablet TAKE 1 TABLET DAILY  90 tablet  3  . fish oil-omega-3 fatty acids 1000 MG capsule Take 2 g by mouth daily.      . Multiple Vitamin (MULTIVITAMIN) tablet Take 1 tablet by mouth daily.        . Vitamin D, Ergocalciferol, (DRISDOL) 50000 UNITS CAPS capsule TAKE 1 CAPSULE EVERY WEEK  12 capsule  3   No current facility-administered medications on file prior to visit.    Current Medications (verified) Current Outpatient Prescriptions    Medication Sig Dispense Refill  . ALPRAZolam (XANAX) 0.25 MG tablet TAKE 1 TABLET EVERY 6 HOURS AS NEEDED  30 tablet  3  . amLODipine-olmesartan (AZOR) 5-40 MG per tablet Take 1 tablet by mouth daily.  30 tablet  3  . bisoprolol-hydrochlorothiazide (ZIAC) 5-6.25 MG per tablet Take 1 tablet by mouth daily.  30 tablet  11  . calcium-vitamin D (OSCAL WITH D 500-200) 500-200 MG-UNIT per tablet Take 1 tablet by mouth daily.        Marland Kitchen etodolac (LODINE) 400 MG tablet TAKE 1 TABLET DAILY  90 tablet  3  . fish oil-omega-3 fatty acids 1000 MG capsule Take 2 g by mouth daily.      . Multiple Vitamin (MULTIVITAMIN) tablet  Take 1 tablet by mouth daily.        . Vitamin D, Ergocalciferol, (DRISDOL) 50000 UNITS CAPS capsule TAKE 1 CAPSULE EVERY WEEK  12 capsule  3   No current facility-administered medications for this visit.     Allergies (verified) Review of patient's allergies indicates no known allergies.   PAST HISTORY  Family History Family History  Problem Relation Age of Onset  . Prostate cancer Father   . Hypertension Father   . Parkinsonism Brother     Social History History  Substance Use Topics  . Smoking status: Never Smoker   . Smokeless tobacco: Not on file  . Alcohol Use: No     Are there smokers in your home (other than you)? No  Risk Factors Current exercise habits: The patient does not participate in regular exercise at present.  Dietary issues discussed: obesity   Cardiac risk factors: dyslipidemia, hypertension, obesity (BMI >= 30 kg/m2) and sedentary lifestyle.  Depression Screen (Note: if answer to either of the following is "Yes", a more complete depression screening is indicated)   Over the past two weeks, have you felt down, depressed or hopeless? No  Over the past two weeks, have you felt little interest or pleasure in doing things? No  Have you lost interest or pleasure in daily life? No  Do you often feel hopeless? No  Do you cry easily over simple  problems? No  Activities of Daily Living In your present state of health, do you have any difficulty performing the following activities?:  Driving? No Managing money?  No Feeding yourself? No Getting from bed to chair? No Climbing a flight of stairs? No Preparing food and eating?: No Bathing or showering? No Getting dressed: No Getting to the toilet? No Using the toilet:No Moving around from place to place: No In the past year have you fallen or had a near fall?:No   Are you sexually active?  No  Do you have more than one partner?  No  Hearing Difficulties: No Do you often ask people to speak up or repeat themselves? No Do you experience ringing or noises in your ears? No Do you have difficulty understanding soft or whispered voices? No   Do you feel that you have a problem with memory? No  Do you often misplace items? No  Do you feel safe at home?  No  Cognitive Testing  Alert? Yes  Normal Appearance?Yes  Oriented to person? Yes  Place? Yes   Time? Yes  Recall of three objects?  Yes  Can perform simple calculations? Yes  Displays appropriate judgment?Yes  Can read the correct time from a watch face?Yes   Advanced Directives have been discussed with the patient? Yes  List the Names of Other Physician/Practitioners you currently use: 1.    Indicate any recent Medical Services you may have received from other than [facility] providers in the past year (date may be approximate).  Immunization History  Administered Date(s) Administered  . Influenza Split 04/14/2012  . Influenza Whole 04/05/2009, 04/21/2011  . Pneumococcal Polysaccharide-23 07/22/2001, 05/24/2012  . Td 07/21/2001  . Tdap 05/24/2012    Screening Tests Health Maintenance  Topic Date Due  . Zostavax  11/20/1995  . Influenza Vaccine  02/18/2013  . Tetanus/tdap  05/24/2022  . Colonoscopy  01/25/2023  . Pneumococcal Polysaccharide Vaccine Age 76 And Over  Completed    All answers were reviewed with the  patient and necessary referrals were made:  Carrie Mew, MD  07/29/2013   History reviewed: allergies, current medications, past family history, past medical history, past social history, past surgical history and problem list  Review of Systems Pertinent items are noted in HPI.    Objective:     Vision by Snellen chart: right eye:20/20, left eye:20/20  Body mass index is 35.94 kg/(m^2). BP 130/80  Pulse 76  Temp(Src) 98.2 F (36.8 C)  Resp 16  Ht 5\' 5"  (1.651 m)  Wt 216 lb (97.977 kg)  BMI 35.94 kg/m2 Physical exam in first part of visit     Assessment:      This is a routine physical examination for this healthy  Female. Reviewed all health maintenance protocols including mammography colonoscopy bone density and reviewed appropriate screening labs. Her immunization history was reviewed as well as her current medications and allergies refills of her chronic medications were given and the plan for yearly health maintenance was discussed all orders and referrals were made as appropriate.       Plan:     During the course of the visit the patient was educated and counseled about appropriate screening and preventive services including:    Influenza vaccine  Td vaccine  Bone densitometry screening  Diet review for nutrition referral? Yes ____  Not Indicated ____   Patient Instructions (the written plan) was given to the patient.  Medicare Attestation I have personally reviewed: The patient's medical and social history Their use of alcohol, tobacco or illicit drugs Their current medications and supplements The patient's functional ability including ADLs,fall risks, home safety risks, cognitive, and hearing and visual impairment Diet and physical activities Evidence for depression or mood disorders  The patient's weight, height, BMI, and visual acuity have been recorded in the chart.  I have made referrals, counseling, and provided education to the patient  based on review of the above and I have provided the patient with a written personalized care plan for preventive services.     Carrie Mew, MD   07/29/2013

## 2013-07-29 NOTE — Progress Notes (Signed)
Pre visit review using our clinic review tool, if applicable. No additional management support is needed unless otherwise documented below in the visit note. 

## 2013-07-30 LAB — VITAMIN D 25 HYDROXY (VIT D DEFICIENCY, FRACTURES): VIT D 25 HYDROXY: 64 ng/mL (ref 30–89)

## 2013-08-01 ENCOUNTER — Other Ambulatory Visit: Payer: Self-pay | Admitting: *Deleted

## 2013-08-01 ENCOUNTER — Telehealth: Payer: Self-pay | Admitting: Internal Medicine

## 2013-08-01 ENCOUNTER — Telehealth: Payer: Self-pay | Admitting: *Deleted

## 2013-08-01 MED ORDER — BENZONATATE 200 MG PO CAPS: 200.0000 mg | ORAL_CAPSULE | Freq: Two times a day (BID) | ORAL | Status: DC | PRN

## 2013-08-01 NOTE — Telephone Encounter (Signed)
Tessalon pearles called in for pt

## 2013-08-01 NOTE — Telephone Encounter (Signed)
Patient Information:  Caller Name: Jenny [name]  Phone: [phone]  Patient: Jenny [name], Jenny [name]  Gender: Female  DOB: 01/28/1934  Age: 80 Years  PCP: Darryll CapersJenkins, John (Adults only)  Office Follow Up:  Does the office need to follow up with this patient?: Yes  Instructions For The Office: Please f/u with pt, thank you.   Symptoms  Reason For Call & Symptoms: Pt states she was seen in the office and was advised Dr Lovell SheehanJenkins would give her Rx for cough medication. Pt reports she did not receive Rx for cough medication and would like Rx [facility] [place].  Reviewed Health History In EMR: Yes  Reviewed Medications In EMR: Yes  Reviewed Allergies In EMR: Yes  Reviewed Surgeries / Procedures: Yes  Date of Onset of Symptoms: 07/29/2013  Guideline(s) Used:  No Protocol Available - Information Only  Disposition Per Guideline:   Discuss with PCP and Callback by Nurse Today  Reason For Disposition Reached:   Nursing judgment  Advice Given:  Call Back If:  New symptoms develop  You become worse.  Patient Refused Recommendation:  Patient Requests Prescription  Pt requesting Rx for cough medication.

## 2013-09-05 ENCOUNTER — Encounter: Payer: Self-pay | Admitting: Internal Medicine

## 2013-10-06 ENCOUNTER — Other Ambulatory Visit: Payer: Self-pay | Admitting: Internal Medicine

## 2013-10-22 ENCOUNTER — Other Ambulatory Visit: Payer: Self-pay | Admitting: Internal Medicine

## 2014-01-27 ENCOUNTER — Ambulatory Visit: Payer: Medicare Other | Admitting: Internal Medicine

## 2014-02-11 ENCOUNTER — Other Ambulatory Visit: Payer: Self-pay | Admitting: Internal Medicine

## 2014-03-27 ENCOUNTER — Other Ambulatory Visit: Payer: Self-pay | Admitting: Internal Medicine

## 2014-04-18 ENCOUNTER — Other Ambulatory Visit: Payer: Self-pay | Admitting: Internal Medicine

## 2014-04-20 DIAGNOSIS — Z23 Encounter for immunization: Secondary | ICD-10-CM | POA: Diagnosis not present

## 2014-05-04 ENCOUNTER — Telehealth: Payer: Self-pay | Admitting: Internal Medicine

## 2014-05-04 MED ORDER — ALPRAZOLAM 0.25 MG PO TABS: ORAL_TABLET | ORAL | Status: DC

## 2014-05-04 NOTE — Telephone Encounter (Signed)
[id number] - [place], [place] - [address] is requesting re-fill on ALPRAZolam (XANAX) 0.25 MG tablet ° °

## 2014-05-04 NOTE — Telephone Encounter (Signed)
Ok x2 per Dr Lovell SheehanJenkins.  [facility]

## 2014-06-26 ENCOUNTER — Telehealth: Payer: Self-pay

## 2014-06-26 MED ORDER — ETODOLAC 400 MG PO TABS: 400.0000 mg | ORAL_TABLET | Freq: Every day | ORAL | Status: DC

## 2014-06-26 NOTE — Telephone Encounter (Signed)
rx sent in electronically 

## 2014-06-26 NOTE — Telephone Encounter (Signed)
Rx request for Etodolac 400 mg- Take 1 tablet daily #90.  Pharm:  [facility]

## 2014-07-06 ENCOUNTER — Ambulatory Visit: Payer: Medicare Other | Admitting: Family Medicine

## 2014-08-22 ENCOUNTER — Other Ambulatory Visit: Payer: Self-pay

## 2014-08-22 MED ORDER — AMLODIPINE-OLMESARTAN 5-40 MG PO TABS: 1.0000 | ORAL_TABLET | Freq: Every day | ORAL | Status: DC

## 2014-08-22 NOTE — Telephone Encounter (Signed)
Rx request for Axor 5-40 mg tablet-Take 1 tablet by mouth daily #30  [facility] [place]  Rx sent to pharmacy.  Pt has upcoming establish appt with Dr. Durene CalHunter.

## 2014-09-13 ENCOUNTER — Encounter: Payer: Self-pay | Admitting: Family Medicine

## 2014-09-13 ENCOUNTER — Ambulatory Visit (INDEPENDENT_AMBULATORY_CARE_PROVIDER_SITE_OTHER): Payer: Medicare Other | Admitting: Family Medicine

## 2014-09-13 VITALS — BP 130/72 | Temp 98.5°F | Wt 235.0 lb

## 2014-09-13 DIAGNOSIS — I10 Essential (primary) hypertension: Secondary | ICD-10-CM | POA: Diagnosis not present

## 2014-09-13 DIAGNOSIS — M4698 Unspecified inflammatory spondylopathy, sacral and sacrococcygeal region: Secondary | ICD-10-CM | POA: Diagnosis not present

## 2014-09-13 DIAGNOSIS — E785 Hyperlipidemia, unspecified: Secondary | ICD-10-CM

## 2014-09-13 DIAGNOSIS — F419 Anxiety disorder, unspecified: Secondary | ICD-10-CM | POA: Insufficient documentation

## 2014-09-13 DIAGNOSIS — M47818 Spondylosis without myelopathy or radiculopathy, sacral and sacrococcygeal region: Secondary | ICD-10-CM

## 2014-09-13 NOTE — Assessment & Plan Note (Signed)
Controlled on Amlodipine-olmesartan 5-40mg , bisoprolol-hctz 5-6.25mg 

## 2014-09-13 NOTE — Progress Notes (Signed)
Jenny Conch, MD Phone: [phone]  Subjective:  Patient presents today to establish care with me as their new primary care provider. Patient was formerly a patient of Dr. Lovell Sheehan. Chief complaint-noted.   Hyperlipidemia-poor control triglycerides  Lab Results  Component Value Date   LDLCALC 78 04/28/2011  On statin: no, fish oil alone Regular exercise: no, advised consider water ROS- no chest pain or shortness of breath. No myalgias  Hypertension-controlled  BP Readings from Last 3 Encounters:  09/13/14 130/72  07/29/13 130/80  01/24/13 134/76  Home BP monitoring-no Compliant with medications-yes without side effects ROS-Denies any CP, HA, SOB, blurry vision.   R SI joint arthritis -for several years, one etodolac a day makes pain manageable ROS- no fecal or urinary incontinence.  The following were reviewed and entered/updated in epic: Past Medical History  Diagnosis Date  . Hypertension   . Osteoporosis   . Arthritis   . Hyperlipidemia    Patient Active Problem List   Diagnosis Date Noted  . Hyperlipidemia 02/02/2007    Priority: Medium  . Essential hypertension 02/02/2007    Priority: Medium  . Anxiety state 09/13/2014    Priority: Low  . Morbid obesity 01/24/2013    Priority: Low  . Arthritis of sacroiliac joint 05/01/2009    Priority: Low  . Osteopenia 02/02/2007    Priority: Low   Past Surgical History  Procedure Laterality Date  . Appendectomy    . Abdominal hysterectomy      still has ovaries. Thinks they took cervix. No pap smears  . Cholecystectomy      Family History  Problem Relation Age of Onset  . Prostate cancer Father   . Hypertension Father   . Parkinsonism Brother     Medications- reviewed and updated Current Outpatient Prescriptions  Medication Sig Dispense Refill  . amLODipine-olmesartan (AZOR) 5-40 MG per tablet Take 1 tablet by mouth daily. 30 tablet 5  . bisoprolol-hydrochlorothiazide (ZIAC) 5-6.25 MG per tablet TAKE  ONE TABLET BY MOUTH ONCE DAILY 30 tablet 11  . calcium-vitamin D (OSCAL WITH D 500-200) 500-200 MG-UNIT per tablet Take 1 tablet by mouth daily.      Marland Kitchen etodolac (LODINE) 400 MG tablet Take 1 tablet (400 mg total) by mouth daily. 90 tablet 0  . fish oil-omega-3 fatty acids 1000 MG capsule Take 2 g by mouth daily.    . Multiple Vitamin (MULTIVITAMIN) tablet Take 1 tablet by mouth daily.      . Vitamin D, Ergocalciferol, (DRISDOL) 50000 UNITS CAPS capsule TAKE 1 CAPSULE EVERY WEEK 12 capsule 2  . ALPRAZolam (XANAX) 0.25 MG tablet TAKE 1 TABLET EVERY 6 HOURS AS NEEDED (Patient not taking: Reported on 09/13/2014) 30 tablet 2  Asa   Allergies-reviewed and updated No Known Allergies  History   Social History  . Marital Status: Married    Spouse Name: N/A  . Number of Children: N/A  . Years of Education: N/A   Social History Main Topics  . Smoking status: Never Smoker   . Smokeless tobacco: Not on file  . Alcohol Use: No  . Drug Use: No  . Sexual Activity: Not Currently   Other Topics Concern  . None   Social History Narrative   Widowed 2012. 2 children (lost 3rd son unknown cause at 19). 9 grandchildren. 2 greatgrandchildren.    Lives alone. Does everything for herself except yardwork. 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 Worked at [employer]  before that.       Hobbies: day trips with [facility], time with family, enjoys meal gatherings    ROS--See HPI   Objective: BP 130/72 mmHg  Temp(Src) 98.5 F (36.9 C)  Wt 235 lb (106.595 kg) Gen: NAD, resting comfortably HEENT: Mucous membranes are moist.  CV: RRR no murmurs rubs or gallops Lungs: CTAB no crackles, wheeze, rhonchi Abdomen: soft/nontender/nondistended/normal bowel sounds.  Ext: trace edema MSK: pain over R SI joint with positive FABER Back - Normal skin, Spine with normal alignment and no deformity.  No tenderness to vertebral process palpation.  Paraspinous  muscles are not tender and without spasm.   Range of motion is full at neck and lumbar sacral regions. Negative Straight leg raise.  Skin: warm, dry, no rash Neuro:  no saddle anesthesia, 5/5 strength lower extremities, PERRLA   Assessment/Plan:  Hyperlipidemia Fish oil alone. Last trig 2015 >200, low HDL. Discussed would advise statin under age 79 but unclear benefit for primary prevention. No rx for now unless for secondary prevention.    Essential hypertension Controlled on Amlodipine-olmesartan 5-40mg , bisoprolol-hctz 5-6.25mg    Arthritis of sacroiliac joint Patient with pain over SI joint worsened by FABER. Strongly suspicious for SI joint arthritis. Her GFR >60  Despite NSAID.    Return precautions advised. We discussed weight being up 19 lbs and need to watch closely. 6 months for AM appointment and update fasting labs

## 2014-09-13 NOTE — Assessment & Plan Note (Signed)
Fish oil alone. Last trig 2015 >200, low HDL. Discussed would advise statin under age 79 but unclear benefit for primary prevention. No rx for now unless for secondary prevention.

## 2014-09-13 NOTE — Assessment & Plan Note (Signed)
Patient with pain over SI joint worsened by FABER. Strongly suspicious for SI joint arthritis. Her GFR >60  Despite NSAID.

## 2014-09-13 NOTE — Patient Instructions (Signed)
Blood pressure looks great  Last labs looked great  My only concern is 19 lbs weight gain. Encourage water exercises-aerobics or walking at the senior center.   "if you dont move it you lose it" saying for geriatric medicine. We want to keep you as mobile and healthy and active for as long as possible!  See me in 6 months in AM and we can recheck labs if you come in having had water only

## 2014-09-26 ENCOUNTER — Other Ambulatory Visit: Payer: Self-pay | Admitting: *Deleted

## 2014-09-26 MED ORDER — ETODOLAC 400 MG PO TABS: 400.0000 mg | ORAL_TABLET | Freq: Every day | ORAL | Status: DC

## 2014-10-02 ENCOUNTER — Telehealth: Payer: Self-pay

## 2014-10-02 MED ORDER — VITAMIN D (ERGOCALCIFEROL) 1.25 MG (50000 UNIT) PO CAPS: ORAL_CAPSULE | ORAL | Status: DC

## 2014-10-02 NOTE — Telephone Encounter (Signed)
[facility] for:  Vit D2 Caps 1.25 mg 50,0000 units-Take 1 capsule every week.

## 2014-10-02 NOTE — Telephone Encounter (Signed)
Medication refilled

## 2014-10-27 ENCOUNTER — Other Ambulatory Visit: Payer: Self-pay | Admitting: *Deleted

## 2014-10-27 MED ORDER — BISOPROLOL-HYDROCHLOROTHIAZIDE 5-6.25 MG PO TABS: 1.0000 | ORAL_TABLET | Freq: Every day | ORAL | Status: DC

## 2014-10-31 ENCOUNTER — Telehealth: Payer: Self-pay

## 2014-10-31 MED ORDER — BISOPROLOL-HYDROCHLOROTHIAZIDE 5-6.25 MG PO TABS: 1.0000 | ORAL_TABLET | Freq: Every day | ORAL | Status: DC

## 2014-10-31 NOTE — Telephone Encounter (Signed)
Medication sent into [facility] 

## 2014-10-31 NOTE — Telephone Encounter (Signed)
[facility] refill request for bisoprolol-hydrochlorothiazide (ZIAC) 5-6.25 MG per tablet.  Looks like Rx was sent to [facility] on 10/27/14 for #30 with 4 refills. Mailorders generally require 90 day supplies on maintenance meds.

## 2015-03-02 ENCOUNTER — Other Ambulatory Visit: Payer: Self-pay | Admitting: Family Medicine

## 2015-03-14 ENCOUNTER — Ambulatory Visit: Payer: Medicare Other | Admitting: Family Medicine

## 2015-03-16 ENCOUNTER — Ambulatory Visit (INDEPENDENT_AMBULATORY_CARE_PROVIDER_SITE_OTHER): Payer: Medicare Other | Admitting: Family Medicine

## 2015-03-16 ENCOUNTER — Encounter: Payer: Self-pay | Admitting: Family Medicine

## 2015-03-16 VITALS — BP 130/70 | HR 70 | Temp 98.4°F | Wt 234.0 lb

## 2015-03-16 DIAGNOSIS — M4698 Unspecified inflammatory spondylopathy, sacral and sacrococcygeal region: Secondary | ICD-10-CM

## 2015-03-16 DIAGNOSIS — I10 Essential (primary) hypertension: Secondary | ICD-10-CM

## 2015-03-16 DIAGNOSIS — E785 Hyperlipidemia, unspecified: Secondary | ICD-10-CM

## 2015-03-16 DIAGNOSIS — M47818 Spondylosis without myelopathy or radiculopathy, sacral and sacrococcygeal region: Secondary | ICD-10-CM

## 2015-03-16 DIAGNOSIS — Z23 Encounter for immunization: Secondary | ICD-10-CM

## 2015-03-16 DIAGNOSIS — F411 Generalized anxiety disorder: Secondary | ICD-10-CM

## 2015-03-16 LAB — COMPREHENSIVE METABOLIC PANEL
ALT: 19 U/L (ref 0–35)
AST: 18 U/L (ref 0–37)
Albumin: 4 g/dL (ref 3.5–5.2)
Alkaline Phosphatase: 52 U/L (ref 39–117)
BUN: 20 mg/dL (ref 6–23)
CO2: 30 meq/L (ref 19–32)
Calcium: 9.6 mg/dL (ref 8.4–10.5)
Chloride: 102 mEq/L (ref 96–112)
Creatinine, Ser: 0.9 mg/dL (ref 0.40–1.20)
GFR: 64.14 mL/min (ref >60.00)
GLUCOSE: 106 mg/dL — AB (ref 70–99)
POTASSIUM: 4 meq/L (ref 3.5–5.1)
SODIUM: 139 meq/L (ref 135–145)
TOTAL PROTEIN: 7.3 g/dL (ref 6.0–8.3)
Total Bilirubin: 0.6 mg/dL (ref 0.2–1.2)

## 2015-03-16 LAB — CBC
HEMATOCRIT: 43.4 % (ref 36.0–46.0)
Hemoglobin: 14.2 g/dL (ref 12.0–15.0)
MCHC: 32.8 g/dL (ref 30.0–36.0)
MCV: 87.8 fl (ref 78.0–100.0)
PLATELETS: 280 10*3/uL (ref 150.0–400.0)
RBC: 4.94 Mil/uL (ref 3.87–5.11)
RDW: 14.8 % (ref 11.5–15.5)
WBC: 9.9 10*3/uL (ref 4.0–10.5)

## 2015-03-16 LAB — TRIGLYCERIDES: Triglycerides: 186 mg/dL — ABNORMAL HIGH (ref 0.0–149.0)

## 2015-03-16 MED ORDER — ALPRAZOLAM 0.25 MG PO TABS: ORAL_TABLET | ORAL | Status: DC

## 2015-03-16 NOTE — Assessment & Plan Note (Signed)
S: Reasonably controlled. Xanax  once every 6 months Reported but fill dates suggest otherwise A/P: Reduce number of Xanax given to #20. Patient is concerned family member may be consuming. She is to lock medicine away. She is to journal her personal use. This number of Xanax to last years if truly once every 6 months. Consider CbT if poor control anxiety.

## 2015-03-16 NOTE — Assessment & Plan Note (Signed)
S: controlled.  A/P:Continue current meds:  Amlodipine-olmesartan 5-40mg , bisoprolol-hctz 5-6.25mg 

## 2015-03-16 NOTE — Progress Notes (Signed)
[name], MD  Subjective:  Jenny [name] is a 81 y.o. year old very pleasant female patient who presents with:  See problem oriented charting ROS- no chest pain, shortness of breath, blurry vision, headaches  Past Medical History-  Patient Active Problem List   Diagnosis Date Noted  . Hyperlipidemia 02/02/2007    Priority: Medium  . Essential hypertension 02/02/2007    Priority: Medium  . Anxiety state 09/13/2014    Priority: Low  . Morbid obesity 01/24/2013    Priority: Low  . Arthritis of sacroiliac joint 05/01/2009    Priority: Low  . Osteopenia 02/02/2007    Priority: Low   Medications- reviewed and updated Current Outpatient Prescriptions  Medication Sig Dispense Refill  . aspirin 81 MG tablet Take 81 mg by mouth daily.    . AZOR 5-40 MG per tablet TAKE 1 TABLET BY MOUTH DAILY. 30 tablet 6  . bisoprolol-hydrochlorothiazide (ZIAC) 5-6.25 MG per tablet Take 1 tablet by mouth daily. 30 tablet 5  . calcium-vitamin D (OSCAL WITH D 500-200) 500-200 MG-UNIT per tablet Take 1 tablet by mouth daily.      Marland Kitchen etodolac (LODINE) 400 MG tablet TAKE 1 TABLET DAILY 90 tablet 3  . fish oil-omega-3 fatty acids 1000 MG capsule Take 2 g by mouth daily.    . Multiple Vitamin (MULTIVITAMIN) tablet Take 1 tablet by mouth daily.      . Vitamin D, Ergocalciferol, (DRISDOL) 50000 UNITS CAPS capsule TAKE 1 CAPSULE EVERY WEEK 12 capsule 2  . ALPRAZolam (XANAX) 0.25 MG tablet TAKE 1 TABLET EVERY 6 HOURS AS NEEDED (Patient not taking: Reported on 09/13/2014) 30 tablet 2    Objective: BP 130/70 mmHg  Pulse 70  Temp(Src) 98.4 F (36.9 C)  Wt 234 lb (106.142 kg) Gen: NAD, resting comfortably CV: RRR no murmurs rubs or gallops Lungs: CTAB no crackles, wheeze, rhonchi Abdomen: soft/nontender/nondistended/normal bowel sounds. No rebound or guarding. obese Ext: trace edema Skin: warm, dry Neuro: grossly normal, moves all extremities, antalgic gait, some pain over SI joint    Assessment/Plan:  Handicapped placard also given for arthritis.  Hyperlipidemia S: previously poorly controlled on fish oil alone Lab Results  Component Value Date   CHOL 170 07/29/2013   HDL 27.30* 07/29/2013   LDLCALC 78 04/28/2011   LDLDIRECT 94.4 07/29/2013   TRIG 238.0* 07/29/2013   CHOLHDL 6 07/29/2013  A/P:Continue current meds, check triglycerides- still unclear benefit from primary prevention at patient age so unlikely to recommend unless CVA/MI other evidence of CV disease. Encouraged need for healthy eating, regular exercise, weight loss. Patient does not seem very motivated.    Essential hypertension S: controlled.  A/P:Continue current meds:  Amlodipine-olmesartan 5-40mg , bisoprolol-hctz 5-6.25mg    Arthritis of sacroiliac joint S: Reasonably controlled on etodolac once daily A/P: Check kidney function. Continue etodolac as long as stable with GFR greater than 60   Morbid obesity Encouraged weight loss.  Anxiety state S: Reasonably controlled. Xanax  once every 6 months Reported but fill dates suggest otherwise A/P: Reduce number of Xanax given to #20. Patient is concerned family member may be consuming. She is to lock medicine away. She is to journal her personal use. This number of Xanax to last years if truly once every 6 months. Consider CbT if poor control anxiety.    6 months  Fasting Orders Placed This Encounter  Procedures  . Flu Vaccine QUAD 36+ mos IM  . Pneumococcal conjugate vaccine 13-valent  . Comprehensive metabolic panel  [place]  . Triglycerides  . CBC    [place]   Meds ordered this encounter  Medications  . ALPRAZolam (XANAX) 0.25 MG tablet    Sig: TAKE 1 TABLET EVERY 6 HOURS AS NEEDED    Dispense:  20 tablet    Refill:  0

## 2015-03-16 NOTE — Assessment & Plan Note (Signed)
Encouraged weight loss 

## 2015-03-16 NOTE — Assessment & Plan Note (Signed)
S: Reasonably controlled on etodolac once daily A/P: Check kidney function. Continue etodolac as long as stable with GFR greater than 60

## 2015-03-16 NOTE — Patient Instructions (Addendum)
Medication Instructions:  Same, see list Refilled xanax. You reported use about once every 6 months so 20 pills should last you years. You reported concern about family member- you need to lock this away. You might also want to journal when you use it. Increasing use- we may need to add therapy  Labwork: Update before you leave  Testing/Procedures/Immunizations: Prevnar 13 (pneumonia shot) and flu shot at your pharmacy.  Patient changed mind and wants today- both given  Follow-Up (all visit scheduling, rescheduling, cancellations including labs should be scheduled at front desk): 6 months.   Sooner if new or worsening symptoms

## 2015-03-16 NOTE — Assessment & Plan Note (Addendum)
S: previously poorly controlled on fish oil alone Lab Results  Component Value Date   CHOL 170 07/29/2013   HDL 27.30* 07/29/2013   LDLCALC 78 04/28/2011   LDLDIRECT 94.4 07/29/2013   TRIG 238.0* 07/29/2013   CHOLHDL 6 07/29/2013  A/P:Continue current meds, check triglycerides- still unclear benefit from primary prevention at patient age so unlikely to recommend unless CVA/MI other evidence of CV disease. Encouraged need for healthy eating, regular exercise, weight loss. Patient does not seem very motivated.

## 2015-05-20 ENCOUNTER — Other Ambulatory Visit: Payer: Self-pay | Admitting: Family Medicine

## 2015-06-05 ENCOUNTER — Other Ambulatory Visit: Payer: Self-pay | Admitting: Family Medicine

## 2015-09-18 ENCOUNTER — Encounter: Payer: Self-pay | Admitting: Family Medicine

## 2015-09-18 ENCOUNTER — Ambulatory Visit (INDEPENDENT_AMBULATORY_CARE_PROVIDER_SITE_OTHER): Payer: Medicare Other | Admitting: Family Medicine

## 2015-09-18 VITALS — BP 122/78 | HR 72 | Temp 98.8°F | Wt 228.0 lb

## 2015-09-18 DIAGNOSIS — I10 Essential (primary) hypertension: Secondary | ICD-10-CM

## 2015-09-18 DIAGNOSIS — E785 Hyperlipidemia, unspecified: Secondary | ICD-10-CM | POA: Diagnosis not present

## 2015-09-18 NOTE — Assessment & Plan Note (Addendum)
S: controlled on Amlodipine-olmesartan 5-40mg , bisoprolol-hctz 5-6.25mg  BP Readings from Last 3 Encounters:  09/18/15 122/78  03/16/15 130/70  09/13/14 130/72  A/P:Continue current medications- doing well on repeat BP On etodolac- we will plan on checking GFR/cr at follow up

## 2015-09-18 NOTE — Assessment & Plan Note (Signed)
Has not used xanax in last 6 months- kept locked away

## 2015-09-18 NOTE — Assessment & Plan Note (Addendum)
S: reasonably controlled on no medicine- previously triglycerides >2015. No myalgias.  Lab Results  Component Value Date   CHOL 170 07/29/2013   HDL 27.30* 07/29/2013   LDLCALC 78 04/28/2011   LDLDIRECT 94.4 07/29/2013   TRIG 186.0* 03/16/2015   CHOLHDL 6 07/29/2013   A/P: continue fish oil alone to help keep triglycerides under 200. Trying to eat better and has lost 6 lbs fortunately which should help with maintainence

## 2015-09-18 NOTE — Patient Instructions (Signed)
No changes to medicine. You are doing very well.

## 2015-09-18 NOTE — Progress Notes (Signed)
[name], MD  Subjective:  Jenny [name] is a 82 y.o. year old very pleasant female patient who presents for/with See problem oriented charting ROS- No chest pain or shortness of breath. No headache or blurry vision.   Past Medical History-  Patient Active Problem List   Diagnosis Date Noted  . Hyperlipidemia 02/02/2007    Priority: Medium  . Essential hypertension 02/02/2007    Priority: Medium  . Anxiety state 09/13/2014    Priority: Low  . Morbid obesity (HCC) 01/24/2013    Priority: Low  . Arthritis of sacroiliac joint (HCC) 05/01/2009    Priority: Low  . Osteopenia 02/02/2007    Priority: Low    Medications- reviewed and updated Current Outpatient Prescriptions  Medication Sig Dispense Refill  . ALPRAZolam (XANAX) 0.25 MG tablet TAKE 1 TABLET EVERY 6 HOURS AS NEEDED (Patient not taking: Reported on 09/18/2015) 20 tablet 0  . aspirin 81 MG tablet Take 81 mg by mouth daily.    . AZOR 5-40 MG per tablet TAKE 1 TABLET BY MOUTH DAILY. 30 tablet 6  . bisoprolol-hydrochlorothiazide (ZIAC) 5-6.25 MG tablet TAKE ONE TABLET BY MOUTH  DAILY 30 tablet 5  . calcium-vitamin D (OSCAL WITH D 500-200) 500-200 MG-UNIT per tablet Take 1 tablet by mouth daily.      Marland Kitchen etodolac (LODINE) 400 MG tablet TAKE 1 TABLET DAILY 90 tablet 3  . fish oil-omega-3 fatty acids 1000 MG capsule Take 2 g by mouth daily.    . Multiple Vitamin (MULTIVITAMIN) tablet Take 1 tablet by mouth daily.      . Vitamin D, Ergocalciferol, (DRISDOL) 50000 UNITS CAPS capsule TAKE 1 CAPSULE EVERY WEEK 52 capsule 1   No current facility-administered medications for this visit.    Objective: BP 122/78 mmHg  Pulse 72  Temp(Src) 98.8 F (37.1 C)  Wt 228 lb (103.42 kg) Gen: NAD, resting comfortably Mucous membranes are moist. CV: RRR no murmurs rubs or gallops Lungs: CTAB no crackles, wheeze, rhonchi Abdomen: soft/nontender/nondistended/normal bowel sounds. obese Ext: no edema Skin: warm, dry Neuro: grossly  normal, moves all extremities  Assessment/Plan:  Essential hypertension S: controlled on Amlodipine-olmesartan 5-40mg , bisoprolol-hctz 5-6.25mg  BP Readings from Last 3 Encounters:  09/18/15 122/78  03/16/15 130/70  09/13/14 130/72  A/P:Continue current medications- doing well on repeat BP On etodolac- we will plan on checking GFR/cr at follow up  Hyperlipidemia S: reasonably controlled on no medicine- previously triglycerides >2015. No myalgias.  Lab Results  Component Value Date   CHOL 170 07/29/2013   HDL 27.30* 07/29/2013   LDLCALC 78 04/28/2011   LDLDIRECT 94.4 07/29/2013   TRIG 186.0* 03/16/2015   CHOLHDL 6 07/29/2013   A/P: continue fish oil alone to help keep triglycerides under 200. Trying to eat better and has lost 6 lbs fortunately which should help with maintainence    Anxiety state Has not used xanax in last 6 months- kept locked away   Return in about 6 months (around 03/17/2016) for follow up- or sooner if needed. Return precautions advised.

## 2015-10-17 ENCOUNTER — Other Ambulatory Visit: Payer: Self-pay | Admitting: Family Medicine

## 2016-01-03 ENCOUNTER — Other Ambulatory Visit: Payer: Self-pay | Admitting: Family Medicine

## 2016-01-03 NOTE — Telephone Encounter (Signed)
Rx refill sent to pharmacy. 

## 2016-02-25 ENCOUNTER — Other Ambulatory Visit: Payer: Self-pay | Admitting: Family Medicine

## 2016-03-17 ENCOUNTER — Encounter: Payer: Self-pay | Admitting: Family Medicine

## 2016-03-17 ENCOUNTER — Ambulatory Visit (INDEPENDENT_AMBULATORY_CARE_PROVIDER_SITE_OTHER): Payer: Medicare Other | Admitting: Family Medicine

## 2016-03-17 VITALS — BP 132/78 | HR 69 | Temp 98.4°F | Wt 229.2 lb

## 2016-03-17 DIAGNOSIS — Z780 Asymptomatic menopausal state: Secondary | ICD-10-CM

## 2016-03-17 DIAGNOSIS — Z23 Encounter for immunization: Secondary | ICD-10-CM | POA: Diagnosis not present

## 2016-03-17 DIAGNOSIS — M4698 Unspecified inflammatory spondylopathy, sacral and sacrococcygeal region: Secondary | ICD-10-CM

## 2016-03-17 DIAGNOSIS — E785 Hyperlipidemia, unspecified: Secondary | ICD-10-CM | POA: Diagnosis not present

## 2016-03-17 DIAGNOSIS — M47818 Spondylosis without myelopathy or radiculopathy, sacral and sacrococcygeal region: Secondary | ICD-10-CM

## 2016-03-17 DIAGNOSIS — I10 Essential (primary) hypertension: Secondary | ICD-10-CM

## 2016-03-17 DIAGNOSIS — M8580 Other specified disorders of bone density and structure, unspecified site: Secondary | ICD-10-CM | POA: Diagnosis not present

## 2016-03-17 NOTE — Patient Instructions (Addendum)
High Dose flu shot today  Schedule your bone density test at check out desk  Schedule a lab visit at the check out desk within 2 weeks. Return for future fasting labs meaning nothing but water after midnight please. Ok to take your medications with water.   I would also like for you to sign up for an annual wellness visit on a Friday with our nurse Darl PikesSusan at the next available appointment. This is a free benefit under medicare that may help us find additional ways to help you.

## 2016-03-17 NOTE — Assessment & Plan Note (Signed)
S: 03/28/2009 DEXA with no t score less than -2. Ca/vit D A/P: we opted to update bone density at this time. Hopeful no progression. Encouraged increased exercises- she likes to walk which would be good for her weight bearing wise.

## 2016-03-17 NOTE — Progress Notes (Signed)
Subjective:  Jenny CoderMary A [name] is a 82 y.o. year old very pleasant female patient who presents for/with See problem oriented charting ROS- No chest pain or shortness of breath. No headache or blurry vision.see any ROS included in HPI as well.   Past Medical History-  Patient Active Problem List   Diagnosis Date Noted  . Hyperlipidemia 02/02/2007    Priority: Medium  . Essential hypertension 02/02/2007    Priority: Medium  . Anxiety state 09/13/2014    Priority: Low  . Morbid obesity (HCC) 01/24/2013    Priority: Low  . Arthritis of sacroiliac joint (HCC) 05/01/2009    Priority: Low  . Osteopenia 02/02/2007    Priority: Low    Medications- reviewed and updated Current Outpatient Prescriptions  Medication Sig Dispense Refill  . ALPRAZolam (XANAX) 0.25 MG tablet TAKE 1 TABLET EVERY 6 HOURS AS NEEDED 20 tablet 0  . amLODipine-olmesartan (AZOR) 5-40 MG tablet TAKE 1 TABLET BY MOUTH DAILY. 30 tablet 5  . aspirin 81 MG tablet Take 81 mg by mouth daily.    . bisoprolol-hydrochlorothiazide (ZIAC) 5-6.25 MG tablet TAKE ONE TABLET BY MOUTH DAILY 30 tablet 2  . calcium-vitamin D (OSCAL WITH D 500-200) 500-200 MG-UNIT per tablet Take 1 tablet by mouth daily.      Marland Kitchen. etodolac (LODINE) 400 MG tablet TAKE 1 TABLET DAILY 90 tablet 2  . fish oil-omega-3 fatty acids 1000 MG capsule Take 2 g by mouth daily.    . Multiple Vitamin (MULTIVITAMIN) tablet Take 1 tablet by mouth daily.      . Vitamin D, Ergocalciferol, (DRISDOL) 50000 UNITS CAPS capsule TAKE 1 CAPSULE EVERY WEEK 52 capsule 1   No current facility-administered medications for this visit.     Objective: BP 132/78 (BP Location: Left Arm, Patient Position: Sitting, Cuff Size: Large)   Pulse 69   Temp 98.4 F (36.9 C) (Oral)   Wt 229 lb 3.2 oz (104 kg)   SpO2 95%   BMI 38.14 kg/m  Gen: NAD, resting comfortably CV: RRR no murmurs rubs or gallops Lungs: CTAB no crackles, wheeze, rhonchi Abdomen: soft/nontender/nondistended/normal bowel  sounds.   Ext: no edema Skin: warm, dry, no rash Neuro: grossly normal, moves all extremities  Assessment/Plan:  Hypertension Arthritis SI joint S: controlled on amlodipine-olmesartan 5-40mg  as well as bisoprolol-HCT 5-6.25mg . Pain controlled on etodolac once a day -and luckily BP remains controlled.  BP Readings from Last 3 Encounters:  03/17/16 132/78  09/18/15 122/78  03/16/15 130/70  A/P:Continue current meds:  Good control. Update GFR with future labs especially with etodolac on board (we did discuss cardiac and CVA risk on this- but patient really needs this for her SI joint pain- pain really worsens off this)  Hyperlipidemia S: reasonably controlled on fish oil alone. No myalgias. Was taking fish oil with focus on weight loss- 6 lbs down previous now stabilized. HDL low, triglycerides high but under 200. Walking a block or two- not sure how long it takes her most days. Has been drinking more coke. Lab Results  Component Value Date   CHOL 170 07/29/2013   HDL 27.30 (L) 07/29/2013   LDLCALC 78 04/28/2011   LDLDIRECT 94.4 07/29/2013   TRIG 186.0 (H) 03/16/2015   CHOLHDL 6 07/29/2013   A/P: suspect reasonable control but really wantto push weight lower- discussed cutting down on her cokes, upping exercise.   Osteopenia S: 03/28/2009 DEXA with no t score less than -2. Ca/vit D A/P: we opted to update bone density at this  time. Hopeful no progression. Encouraged increased exercises- she likes to walk which would be good for her weight bearing wise.    Return in about 6 months (around 09/17/2016) for follow up- or sooner if needed.  Orders Placed This Encounter  Procedures  . DG Bone Density    Standing Status:   Future    Standing Expiration Date:   05/17/2017    Order Specific Question:   Reason for Exam (SYMPTOM  OR DIAGNOSIS REQUIRED)    Answer:   postmenopausal-bone density follow up. last 7 years ago. osteopenia previously    Order Specific Question:   Preferred imaging  location?    Answer:   [facility]  . CBC    Standing Status:   Future    Standing Expiration Date:   03/17/2017  . Comprehensive metabolic panel    [place]    Standing Status:   Future    Standing Expiration Date:   03/17/2017  . Lipid panel    Standing Status:   Future    Standing Expiration Date:   03/17/2017   Return precautions advised.  [name], MD

## 2016-03-17 NOTE — Progress Notes (Signed)
Pre visit review using our clinic review tool, if applicable. No additional management support is needed unless otherwise documented below in the visit note. 

## 2016-03-17 NOTE — Addendum Note (Signed)
Addended by: Vicente MalesSOUTHERN HIZER, Zoa Dowty M on: 03/17/2016 03:25 PM   Modules accepted: Orders

## 2016-03-17 NOTE — Assessment & Plan Note (Signed)
[facility]

## 2016-03-31 ENCOUNTER — Other Ambulatory Visit (INDEPENDENT_AMBULATORY_CARE_PROVIDER_SITE_OTHER): Payer: Medicare Other

## 2016-03-31 DIAGNOSIS — E785 Hyperlipidemia, unspecified: Secondary | ICD-10-CM

## 2016-03-31 LAB — COMPREHENSIVE METABOLIC PANEL
ALBUMIN: 3.9 g/dL (ref 3.5–5.2)
ALK PHOS: 46 U/L (ref 39–117)
ALT: 24 U/L (ref 0–35)
AST: 22 U/L (ref 0–37)
BILIRUBIN TOTAL: 0.6 mg/dL (ref 0.2–1.2)
BUN: 16 mg/dL (ref 6–23)
CALCIUM: 9 mg/dL (ref 8.4–10.5)
CHLORIDE: 101 meq/L (ref 96–112)
CO2: 29 mEq/L (ref 19–32)
CREATININE: 0.8 mg/dL (ref 0.40–1.20)
GFR: 73.29 mL/min (ref >60.00)
Glucose, Bld: 102 mg/dL — ABNORMAL HIGH (ref 70–99)
Potassium: 4.3 mEq/L (ref 3.5–5.1)
Sodium: 137 mEq/L (ref 135–145)
TOTAL PROTEIN: 7.3 g/dL (ref 6.0–8.3)

## 2016-03-31 LAB — CBC
HEMATOCRIT: 44.4 % (ref 36.0–46.0)
Hemoglobin: 14.7 g/dL (ref 12.0–15.0)
MCHC: 33.2 g/dL (ref 30.0–36.0)
MCV: 87.7 fl (ref 78.0–100.0)
Platelets: 284 10*3/uL (ref 150.0–400.0)
RBC: 5.06 Mil/uL (ref 3.87–5.11)
RDW: 14.6 % (ref 11.5–15.5)
WBC: 10.7 10*3/uL — AB (ref 4.0–10.5)

## 2016-03-31 LAB — LIPID PANEL
CHOLESTEROL: 170 mg/dL (ref 0–200)
HDL: 34.8 mg/dL — ABNORMAL LOW (ref >39.00)
LDL CALC: 97 mg/dL (ref 0–99)
NonHDL: 135.62
TRIGLYCERIDES: 193 mg/dL — AB (ref 0.0–149.0)
Total CHOL/HDL Ratio: 5
VLDL: 38.6 mg/dL (ref 0.0–40.0)

## 2016-04-01 ENCOUNTER — Ambulatory Visit (INDEPENDENT_AMBULATORY_CARE_PROVIDER_SITE_OTHER)
Admission: RE | Admit: 2016-04-01 | Discharge: 2016-04-01 | Disposition: A | Discharge status: Home / Self Care | Payer: Medicare Other | Source: Ambulatory Visit | Attending: Family Medicine | Admitting: Family Medicine

## 2016-04-01 DIAGNOSIS — Z780 Asymptomatic menopausal state: Secondary | ICD-10-CM | POA: Diagnosis not present

## 2016-04-08 ENCOUNTER — Other Ambulatory Visit: Payer: Self-pay

## 2016-04-08 MED ORDER — BISOPROLOL-HYDROCHLOROTHIAZIDE 5-6.25 MG PO TABS: 1.0000 | ORAL_TABLET | Freq: Every day | ORAL | 5 refills | Status: DC

## 2016-04-27 ENCOUNTER — Other Ambulatory Visit: Payer: Self-pay | Admitting: Family Medicine

## 2016-06-06 ENCOUNTER — Ambulatory Visit: Payer: Medicare Other

## 2016-06-10 ENCOUNTER — Ambulatory Visit (INDEPENDENT_AMBULATORY_CARE_PROVIDER_SITE_OTHER): Payer: Medicare Other

## 2016-06-10 VITALS — BP 124/84 | HR 70 | Ht 62.0 in | Wt 230.0 lb

## 2016-06-10 DIAGNOSIS — Z0000 Encounter for general adult medical examination without abnormal findings: Secondary | ICD-10-CM | POA: Diagnosis not present

## 2016-06-10 NOTE — Patient Instructions (Addendum)
Jenny [name] , Thank you for taking time to come for your Medicare Wellness Visit. I appreciate your ongoing commitment to your health goals. Please review the following plan we discussed and let me know if I can assist you in the future.   Being mindful; and consider journaling [email or web address];   Will bring a copy of Living will or HCPOA   To find out the name of the osteoporosis medicine and discuss with Dr. Yong Channel      These are the goals we discussed: Goals    . patient          Will review the [employer]opor[employer] and review the information     . Weight (lb) < 200 lb (90.7 kg)          Smaller portions;  Fat free or low fat dairy products Fish high in omega-3 acids ( salmon, tuna, trout) Fruits, such as apples, bananas, oranges, pears, prunes Legumes, such as kidney beans, lentils, checkpeas, black-eyed peas and lima beans Vegetables; broccoli, cabbage, carrots Whole grains;   Plant fats are better; decrease "white" foods as pasta, rice, bread and desserts, sugar; Avoid red meat (limiting) palm and coconut oils; sugary foods and beverages  Two nutrients that raise blood chol levels are saturated fats and trans fat; in hydrogenated oils and fats, as stick margarine, baked goods (cookes, cakes, pies, crackers; frosting; and coffee creamers;   Some Fats lower cholesterol: Monounsaturated and polyunsaturated   Review strategies for weight loss  Avocados Corn, sunflower, and soybean oils Nuts and seeds, such as walnuts Olive, canola, peanut, safflower, and sesame oils Peanut butter Salmon and trout Tofu       . Weight < 200 lb (90.719 kg)       This is a list of the screening recommended for you and due dates:  Health Maintenance  Topic Date Due  . Tetanus Vaccine  05/24/2022  . Flu Shot  Completed  . DEXA scan (bone density measurement)  Completed  . Pneumonia vaccines  Completed     Stress and Stress Management Stress is a normal reaction to  life events. It is what you feel when life demands more than you are used to or more than you can handle. Some stress can be useful. For example, the stress reaction can help you catch the last bus of the day, study for a test, or meet a deadline at work. But stress that occurs too often or for too long can cause problems. It can affect your emotional health and interfere with relationships and normal daily activities. Too much stress can weaken your immune system and increase your risk for physical illness. If you already have a medical problem, stress can make it worse. What are the causes? All sorts of life events may cause stress. An event that causes stress for one person may not be stressful for another person. Major life events commonly cause stress. These may be positive or negative. Examples include losing your job, moving into a new home, getting married, having a baby, or losing a loved one. Less obvious life events may also cause stress, especially if they occur day after day or in combination. Examples include working long hours, driving in traffic, caring for children, being in debt, or being in a difficult relationship. What are the signs or symptoms? Stress may cause emotional symptoms including, the following:  Anxiety. This is feeling worried, afraid, on edge, overwhelmed, or out of control.  Anger. This is feeling  irritated or impatient.  Depression. This is feeling sad, down, helpless, or guilty.  Difficulty focusing, remembering, or making decisions. Stress may cause physical symptoms, including the following:  Aches and pains. These may affect your head, neck, back, stomach, or other areas of your body.  Tight muscles or clenched jaw.  Low energy or trouble sleeping. Stress may cause unhealthy behaviors, including the following:  Eating to feel better (overeating) or skipping meals.  Sleeping too little, too much, or both.  Working too much or putting off tasks  (procrastination).  Smoking, drinking alcohol, or using drugs to feel better. How is this diagnosed? Stress is diagnosed through an assessment by your health care provider. Your health care provider will ask questions about your symptoms and any stressful life events.Your health care provider will also ask about your medical history and may order blood tests or other tests. Certain medical conditions and medicine can cause physical symptoms similar to stress. Mental illness can cause emotional symptoms and unhealthy behaviors similar to stress. Your health care provider may refer you to a mental health professional for further evaluation. How is this treated? Stress management is the recommended treatment for stress.The goals of stress management are reducing stressful life events and coping with stress in healthy ways. Techniques for reducing stressful life events include the following:  Stress identification. Self-monitor for stress and identify what causes stress for you. These skills may help you to avoid some stressful events.  Time management. Set your priorities, keep a calendar of events, and learn to say "no." These tools can help you avoid making too many commitments. Techniques for coping with stress include the following:  Rethinking the problem. Try to think realistically about stressful events rather than ignoring them or overreacting. Try to find the positives in a stressful situation rather than focusing on the negatives.  Exercise. Physical exercise can release both physical and emotional tension. The key is to find a form of exercise you enjoy and do it regularly.  Relaxation techniques. These relax the body and mind. Examples include yoga, meditation, tai chi, biofeedback, deep breathing, progressive muscle relaxation, listening to music, being out in nature, journaling, and other hobbies. Again, the key is to find one or more that you enjoy and can do regularly.  Healthy  lifestyle. Eat a balanced diet, get plenty of sleep, and do not smoke. Avoid using alcohol or drugs to relax.  Strong support network. Spend time with family, friends, or other people you enjoy being around.Express your feelings and talk things over with someone you trust. Counseling or talktherapy with a mental health professional may be helpful if you are having difficulty managing stress on your own. Medicine is typically not recommended for the treatment of stress.Talk to your health care provider if you think you need medicine for symptoms of stress. Follow these instructions at home:  Keep all follow-up visits as directed by your health care provider.  Take all medicines as directed by your health care provider. Contact a health care provider if:  Your symptoms get worse or you start having new symptoms.  You feel overwhelmed by your problems and can no longer manage them on your own. Get help right away if:  You feel like hurting yourself or someone else. This information is not intended to replace advice given to you by your health care provider. Make sure you discuss any questions you have with your health care provider. Document Released: 12/31/2000 Document Revised: 12/13/2015 Document Reviewed: 03/01/2013 Elsevier Interactive  Patient Education  2017 [employer] Prevention in the Home Introduction Falls can cause injuries. They can happen to people of all ages. There are many things you can do to make your home safe and to help prevent falls. What can I do on the outside of my home?  Regularly fix the edges of walkways and driveways and fix any cracks.  Remove anything that might make you trip as you walk through a door, such as a raised step or threshold.  Trim any bushes or trees on the path to your home.  Use bright outdoor lighting.  Clear any walking paths of anything that might make someone trip, such as rocks or tools.  Regularly check to see if  handrails are loose or broken. Make sure that both sides of any steps have handrails.  Any raised decks and porches should have guardrails on the edges.  Have any leaves, snow, or ice cleared regularly.  Use sand or salt on walking paths during winter.  Clean up any spills in your garage right away. This includes oil or grease spills. What can I do in the bathroom?  Use night lights.  Install grab bars by the toilet and in the tub and shower. Do not use towel bars as grab bars.  Use non-skid mats or decals in the tub or shower.  If you need to sit down in the shower, use a plastic, non-slip stool.  Keep the floor dry. Clean up any water that spills on the floor as soon as it happens.  Remove soap buildup in the tub or shower regularly.  Attach bath mats securely with double-sided non-slip rug tape.  Do not have throw rugs and other things on the floor that can make you trip. What can I do in the bedroom?  Use night lights.  Make sure that you have a light by your bed that is easy to reach.  Do not use any sheets or blankets that are too big for your bed. They should not hang down onto the floor.  Have a firm chair that has side arms. You can use this for support while you get dressed.  Do not have throw rugs and other things on the floor that can make you trip. What can I do in the kitchen?  Clean up any spills right away.  Avoid walking on wet floors.  Keep items that you use a lot in easy-to-reach places.  If you need to reach something above you, use a strong step stool that has a grab bar.  Keep electrical cords out of the way.  Do not use floor polish or wax that makes floors slippery. If you must use wax, use non-skid floor wax.  Do not have throw rugs and other things on the floor that can make you trip. What can I do with my stairs?  Do not leave any items on the stairs.  Make sure that there are handrails on both sides of the stairs and use them. Fix  handrails that are broken or loose. Make sure that handrails are as long as the stairways.  Check any carpeting to make sure that it is firmly attached to the stairs. Fix any carpet that is loose or worn.  Avoid having throw rugs at the top or bottom of the stairs. If you do have throw rugs, attach them to the floor with carpet tape.  Make sure that you have a light switch at the top  of the stairs and the bottom of the stairs. If you do not have them, ask someone to add them for you. What else can I do to help prevent falls?  Wear shoes that:  Do not have high heels.  Have rubber bottoms.  Are comfortable and fit you well.  Are closed at the toe. Do not wear sandals.  If you use a stepladder:  Make sure that it is fully opened. Do not climb a closed stepladder.  Make sure that both sides of the stepladder are locked into place.  Ask someone to hold it for you, if possible.  Clearly mark and make sure that you can see:  Any grab bars or handrails.  First and last steps.  Where the edge of each step is.  Use tools that help you move around (mobility aids) if they are needed. These include:  Canes.  Walkers.  Scooters.  Crutches.  Turn on the lights when you go into a dark area. Replace any light bulbs as soon as they burn out.  Set up your furniture so you have a clear path. Avoid moving your furniture around.  If any of your floors are uneven, fix them.  If there are any pets around you, be aware of where they are.  Review your medicines with your doctor. Some medicines can make you feel dizzy. This can increase your chance of falling. Ask your doctor what other things that you can do to help prevent falls. This information is not intended to replace advice given to you by your health care provider. Make sure you discuss any questions you have with your health care provider. Document Released: 05/03/2009 Document Revised: 12/13/2015 Document Reviewed:  08/11/2014  2017 Elsevier  Health Maintenance, Female Introduction Adopting a healthy lifestyle and getting preventive care can go a long way to promote health and wellness. Talk with your health care provider about what schedule of regular examinations is right for you. This is a good chance for you to check in with your provider about disease prevention and staying healthy. In between checkups, there are plenty of things you can do on your own. Experts have done a lot of research about which lifestyle changes and preventive measures are most likely to keep you healthy. Ask your health care provider for more information. Weight and diet Eat a healthy diet  Be sure to include plenty of vegetables, fruits, low-fat dairy products, and lean protein.  Do not eat a lot of foods high in solid fats, added sugars, or salt.  Get regular exercise. This is one of the most important things you can do for your health.  Most adults should exercise for at least 150 minutes each week. The exercise should increase your heart rate and make you sweat (moderate-intensity exercise).  Most adults should also do strengthening exercises at least twice a week. This is in addition to the moderate-intensity exercise. Maintain a healthy weight  Body mass index (BMI) is a measurement that can be used to identify possible weight problems. It estimates body fat based on height and weight. Your health care provider can help determine your BMI and help you achieve or maintain a healthy weight.  For females 32 years of age and older:  A BMI below 18.5 is considered underweight.  A BMI of 18.5 to 24.9 is normal.  A BMI of 25 to 29.9 is considered overweight.  A BMI of 30 and above is considered obese. Watch levels of cholesterol and blood  lipids  You should start having your blood tested for lipids and cholesterol at 80 years of age, then have this test every 5 years.  You may need to have your cholesterol levels  checked more often if:  Your lipid or cholesterol levels are high.  You are older than 80 years of age.  You are at high risk for heart disease. Cancer screening Lung Cancer  Lung cancer screening is recommended for adults 40-49 years old who are at high risk for lung cancer because of a history of smoking.  A yearly low-dose CT scan of the lungs is recommended for people who:  Currently smoke.  Have quit within the past 15 years.  Have at least a 30-pack-year history of smoking. A pack year is smoking an average of one pack of cigarettes a day for 1 year.  Yearly screening should continue until it has been 15 years since you quit.  Yearly screening should stop if you develop a health problem that would prevent you from having lung cancer treatment. Breast Cancer  Practice breast self-awareness. This means understanding how your breasts normally appear and feel.  It also means doing regular breast self-exams. Let your health care provider know about any changes, no matter how small.  If you are in your 20s or 30s, you should have a clinical breast exam (CBE) by a health care provider every 1-3 years as part of a regular health exam.  If you are 66 or older, have a CBE every year. Also consider having a breast X-ray (mammogram) every year.  If you have a family history of breast cancer, talk to your health care provider about genetic screening.  If you are at high risk for breast cancer, talk to your health care provider about having an MRI and a mammogram every year.  Breast cancer gene (BRCA) assessment is recommended for women who have family members with BRCA-related cancers. BRCA-related cancers include:  Breast.  Ovarian.  Tubal.  Peritoneal cancers.  Results of the assessment will determine the need for genetic counseling and BRCA1 and BRCA2 testing. Cervical Cancer  Your health care provider may recommend that you be screened regularly for cancer of the pelvic  organs (ovaries, uterus, and vagina). This screening involves a pelvic examination, including checking for microscopic changes to the surface of your cervix (Pap test). You may be encouraged to have this screening done every 3 years, beginning at age 48.  For women ages 40-65, health care providers may recommend pelvic exams and Pap testing every 3 years, or they may recommend the Pap and pelvic exam, combined with testing for human papilloma virus (HPV), every 5 years. Some types of HPV increase your risk of cervical cancer. Testing for HPV may also be done on women of any age with unclear Pap test results.  Other health care providers may not recommend any screening for nonpregnant women who are considered low risk for pelvic cancer and who do not have symptoms. Ask your health care provider if a screening pelvic exam is right for you.  If you have had past treatment for cervical cancer or a condition that could lead to cancer, you need Pap tests and screening for cancer for at least 20 years after your treatment. If Pap tests have been discontinued, your risk factors (such as having a new sexual partner) need to be reassessed to determine if screening should resume. Some women have medical problems that increase the chance of getting cervical cancer. In these  cases, your health care provider may recommend more frequent screening and Pap tests. Colorectal Cancer  This type of cancer can be detected and often prevented.  Routine colorectal cancer screening usually begins at 80 years of age and continues through 80 years of age.  Your health care provider may recommend screening at an earlier age if you have risk factors for colon cancer.  Your health care provider may also recommend using home test kits to check for hidden blood in the stool.  A small camera at the end of a tube can be used to examine your colon directly (sigmoidoscopy or colonoscopy). This is done to check for the earliest forms  of colorectal cancer.  Routine screening usually begins at age 66.  Direct examination of the colon should be repeated every 5-10 years through 80 years of age. However, you may need to be screened more often if early forms of precancerous polyps or small growths are found. Skin Cancer  Check your skin from head to toe regularly.  Tell your health care provider about any new moles or changes in moles, especially if there is a change in a mole's shape or color.  Also tell your health care provider if you have a mole that is larger than the size of a pencil eraser.  Always use sunscreen. Apply sunscreen liberally and repeatedly throughout the day.  Protect yourself by wearing long sleeves, pants, a wide-brimmed hat, and sunglasses whenever you are outside. Heart disease, diabetes, and high blood pressure  High blood pressure causes heart disease and increases the risk of stroke. High blood pressure is more likely to develop in:  People who have blood pressure in the high end of the normal range (130-139/85-89 mm Hg).  People who are overweight or obese.  People who are African American.  If you are 84-1 years of age, have your blood pressure checked every 3-5 years. If you are 78 years of age or older, have your blood pressure checked every year. You should have your blood pressure measured twice-once when you are at a hospital or clinic, and once when you are not at a hospital or clinic. Record the average of the two measurements. To check your blood pressure when you are not at a hospital or clinic, you can use:  An automated blood pressure machine at a pharmacy.  A home blood pressure monitor.  If you are between 68 years and 16 years old, ask your health care provider if you should take aspirin to prevent strokes.  Have regular diabetes screenings. This involves taking a blood sample to check your fasting blood sugar level.  If you are at a normal weight and have a low risk for  diabetes, have this test once every three years after 80 years of age.  If you are overweight and have a high risk for diabetes, consider being tested at a younger age or more often. Preventing infection Hepatitis B  If you have a higher risk for hepatitis B, you should be screened for this virus. You are considered at high risk for hepatitis B if:  You were born in a country where hepatitis B is common. Ask your health care provider which countries are considered high risk.  Your parents were born in a high-risk country, and you have not been immunized against hepatitis B (hepatitis B vaccine).  You have HIV or AIDS.  You use needles to inject street drugs.  You live with someone who has hepatitis B.  You have had sex with someone who has hepatitis B.  You get hemodialysis treatment.  You take certain medicines for conditions, including cancer, organ transplantation, and autoimmune conditions. Hepatitis C  Blood testing is recommended for:  Everyone born from [birth year] through 1963.  Anyone with known risk factors for hepatitis C. Sexually transmitted infections (STIs)  You should be screened for sexually transmitted infections (STIs) including gonorrhea and chlamydia if:  You are sexually active and are younger than 80 years of age.  You are older than 80 years of age and your health care provider tells you that you are at risk for this type of infection.  Your sexual activity has changed since you were last screened and you are at an increased risk for chlamydia or gonorrhea. Ask your health care provider if you are at risk.  If you do not have HIV, but are at risk, it may be recommended that you take a prescription medicine daily to prevent HIV infection. This is called pre-exposure prophylaxis (PrEP). You are considered at risk if:  You are sexually active and do not regularly use condoms or know the HIV status of your partner(s).  You take drugs by injection.  You are  sexually active with a partner who has HIV. Talk with your health care provider about whether you are at high risk of being infected with HIV. If you choose to begin PrEP, you should first be tested for HIV. You should then be tested every 3 months for as long as you are taking PrEP. Pregnancy  If you are premenopausal and you may become pregnant, ask your health care provider about preconception counseling.  If you may become pregnant, take 400 to 800 micrograms (mcg) of folic acid every day.  If you want to prevent pregnancy, talk to your health care provider about birth control (contraception). Osteoporosis and menopause  Osteoporosis is a disease in which the bones lose minerals and strength with aging. This can result in serious bone fractures. Your risk for osteoporosis can be identified using a bone density scan.  If you are 47 years of age or older, or if you are at risk for osteoporosis and fractures, ask your health care provider if you should be screened.  Ask your health care provider whether you should take a calcium or vitamin D supplement to lower your risk for osteoporosis.  Menopause may have certain physical symptoms and risks.  Hormone replacement therapy may reduce some of these symptoms and risks. Talk to your health care provider about whether hormone replacement therapy is right for you. Follow these instructions at home:  Schedule regular health, dental, and eye exams.  Stay current with your immunizations.  Do not use any tobacco products including cigarettes, chewing tobacco, or electronic cigarettes.  If you are pregnant, do not drink alcohol.  If you are breastfeeding, limit how much and how often you drink alcohol.  Limit alcohol intake to no more than 1 drink per day for nonpregnant women. One drink equals 12 ounces of beer, 5 ounces of wine, or 1 ounces of hard liquor.  Do not use street drugs.  Do not share needles.  Ask your health care  provider for help if you need support or information about quitting drugs.  Tell your health care provider if you often feel depressed.  Tell your health care provider if you have ever been abused or do not feel safe at home. This information is not intended to replace advice given to  you by your health care provider. Make sure you discuss any questions you have with your health care provider. Document Released: 01/20/2011 Document Revised: 12/13/2015 Document Reviewed: 04/10/2015  2017 [employer]

## 2016-06-10 NOTE — Progress Notes (Signed)
Subjective:   Jenny [name] is a 82 y.o. female who presents for Medicare Annual (Subsequent) preventive examination.  The Patient was informed that the wellness visit is to identify future health risk and educate and initiate measures that can reduce risk for increased disease through the lifespan.    NO ROS; Medicare Wellness Visit  Describes health as good, fair or great? Very good  One dt [place] in [place] One son; wife is a Publishing rights managernurse practitioner Lost son -died [id number]  Spouse passed 2012;  Lives in home alone; assist with care giving [phone];  She is a cousin but was raised by her mother so feels like a sister.   LTC: has long term care insurance and home health care Would be at home as long as possible and only move to where son lives if she fails  Preventive Screening -Counseling & Management  Osteoporosis - bone density; 03/2016 LFN -2.6 / 04/2018  Ordered 800 mg vit d on top of her supplement; Recommended Fosomax and was afraid to take it after reading the side effects;  Referred to the osteoporosis foundation  Was told by her nurse practitioner dtr in law that there is a medicine she gave her mother and will find out what this is and discuss with Dr. Durene CalHunter;   Mammogram 03/2009/ waive them due to age   Current smoking/ tobacco status/ never smoked  Second Hand Smoke status; No Smokers in the home ETOH: no   RISK FACTORS Caregiver for 82yo?  Regular exercise 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 No  Safety; community, wears sunscreen, safe place for firearms; Motor vehicle accidents; no   Cardiac Risk Factors:  Advanced aged  68>65 in women Hyperlipidemia - cho 170; Trig 193; HDL 34; LDL 97) Family History (father had prostate cancer; htn) (mother had a pacemaker)  Sleeps well and does not snore Energy during the day  Obesity Yes Can discuss weight loss with the doctor   Eye exam- every year Dr. Charlotte SanesMcCuen in process of switching  No vision issues   Depression Screen  PhQ 2: negative  Activities of Daily Living - See functional screen   Cognitive testing; Ad8 score; 0 or less than 2  MMSE deferred or completed if AD8 + 2 issues  Advanced Directives does have this completed Recently completed new will  Can bring a copy of   List the name of Physicians or other Practitioners you currently use:   Immunization History  Administered Date(s) Administered  . Influenza Split 04/14/2012  . Influenza Whole 04/05/2009, 04/21/2011  . Influenza, High Dose Seasonal PF 03/17/2016  . Influenza,inj,Quad PF,36+ Mos 03/16/2015  . Influenza-Unspecified 04/20/2014  . Pneumococcal Conjugate-13 03/16/2015  . Pneumococcal Polysaccharide-23 07/22/2001, 05/24/2012  . Td 07/21/2001  . Tdap 05/24/2012   Required Immunizations  needed today  Screening test up to date or reviewed for plan of completion There are no preventive care reminders to display for this patient.   Cardiac Risk Factors include: advanced age (>8055men, 20>65 women);dyslipidemia;family history of premature cardiovascular disease;hypertension;obesity (BMI >30kg/m2)     Objective:     Vitals: BP 124/84   Pulse 70   Ht 5\' 2"  (1.575 m)   Wt 230 lb (104.3 kg)   SpO2 97%   BMI 42.07 kg/m   Body mass index is 42.07 kg/m.   Tobacco History  Smoking Status  . Never Smoker  Smokeless Tobacco  . Not on  file     Counseling given: Not Answered   Past Medical History:  Diagnosis Date  . Arthritis   . Hyperlipidemia   . Hypertension   . Osteoporosis    Past Surgical History:  Procedure Laterality Date  . ABDOMINAL HYSTERECTOMY     still has ovaries. Thinks they took cervix. No pap smears  . APPENDECTOMY    . CHOLECYSTECTOMY     Family History  Problem Relation Age of Onset  . Prostate cancer Father   . Hypertension Father   . Parkinsonism Brother    History  Sexual Activity  . Sexual activity: Not Currently    Outpatient Encounter Prescriptions as of 06/10/2016  Medication Sig  . ALPRAZolam (XANAX) 0.25 MG tablet TAKE 1 TABLET EVERY 6 HOURS AS NEEDED  . amLODipine-olmesartan (AZOR) 5-40 MG tablet TAKE 1 TABLET BY MOUTH DAILY.  Marland Kitchen. aspirin 81 MG tablet Take 81 mg by mouth daily.  . bisoprolol-hydrochlorothiazide (ZIAC) 5-6.25 MG tablet Take 1 tablet by mouth daily.  . calcium-vitamin D (OSCAL WITH D 500-200) 500-200 MG-UNIT per tablet Take 1 tablet by mouth daily.    Marland Kitchen. etodolac (LODINE) 400 MG tablet TAKE 1 TABLET DAILY  . fish oil-omega-3 fatty acids 1000 MG capsule Take 2 g by mouth daily.  . Multiple Vitamin (MULTIVITAMIN) tablet Take 1 tablet by mouth daily.    . Vitamin D, Ergocalciferol, (DRISDOL) 50000 UNITS CAPS capsule TAKE 1 CAPSULE EVERY WEEK   No facility-administered encounter medications on file as of 06/10/2016.     Activities of Daily Living In your present state of health, do you have any difficulty performing the following activities: 06/10/2016  Hearing? N  Vision? N  Difficulty concentrating or making decisions? N  Walking or climbing stairs? N  Dressing or bathing? N  Doing errands, shopping? N  Preparing Food and eating ? N  Using the Toilet? N  In the past six months, have you accidently leaked urine? N  Do you have problems with loss of bowel control? N  Managing your Medications? N  Managing your Finances? N  Housekeeping or managing your  Housekeeping? N  Some recent data might be hidden    Patient Care Team: Shelva MajesticStephen O Hunter, MD as PCP - General (Family Medicine)    Assessment:     Exercise Activities and Dietary recommendations Current Exercise Habits: Home exercise routine, Type of exercise: walking, Time (Minutes): 60, Frequency (Times/Week): 4, Weekly Exercise (Minutes/Week): 240, Intensity: Mild  Goals    . patient          Will review the Osteoporosis Foundation and review the information     . Weight (lb) < 200 lb (90.7 kg)          Smaller portions;  Fat free or low fat dairy products Fish high in omega-3 acids ( salmon, tuna, trout) Fruits, such as  apples, bananas, oranges, pears, prunes Legumes, such as kidney beans, lentils, checkpeas, black-eyed peas and lima beans Vegetables; broccoli, cabbage, carrots Whole grains;   Plant fats are better; decrease "white" foods as pasta, rice, bread and desserts, sugar; Avoid red meat (limiting) palm and coconut oils; sugary foods and beverages  Two nutrients that raise blood chol levels are saturated fats and trans fat; in hydrogenated oils and fats, as stick margarine, baked goods (cookes, cakes, pies, crackers; frosting; and coffee creamers;   Some Fats lower cholesterol: Monounsaturated and polyunsaturated   Review strategies for weight loss  Avocados Corn, sunflower, and soybean oils Nuts and seeds, such as walnuts Olive, canola, peanut, safflower, and sesame oils Peanut butter Salmon and trout Tofu       . Weight < 200 lb (90.719 kg)      Fall Risk Fall Risk  06/10/2016 09/18/2015 09/13/2014 07/29/2013 09/22/2012  Falls in the past year? No No No No No   Depression Screen PHQ 2/9 Scores 06/10/2016 09/18/2015 09/13/2014 07/29/2013  PHQ - 2 Score 0 0 0 0     Cognitive Function MMSE - Mini Mental State Exam 06/10/2016  Not completed: (No Data)        Immunization History  Administered Date(s) Administered  . Influenza Split 04/14/2012  . Influenza  Whole 04/05/2009, 04/21/2011  . Influenza, High Dose Seasonal PF 03/17/2016  . Influenza,inj,Quad PF,36+ Mos 03/16/2015  . Influenza-Unspecified 04/20/2014  . Pneumococcal Conjugate-13 03/16/2015  . Pneumococcal Polysaccharide-23 07/22/2001, 05/24/2012  . Td 07/21/2001  . Tdap 05/24/2012   Screening Tests Health Maintenance  Topic Date Due  . TETANUS/TDAP  05/24/2022  . INFLUENZA VACCINE  Completed  . DEXA SCAN  Completed  . PNA vac Low Risk Adult  Completed      Plan:   Plan To get the name of osteoporosis med from her nurse practitioner dtr in law and will discuss further with Dr. Temple Pacini weight is an issue; Will try to lose but portion control  Encouraged activity and stress reduction but did not state her stresses today.  Sleeps well;  No preventive health due   During the course of the visit the patient was educated and counseled about the following appropriate screening and preventive services:   Vaccines to include Pneumoccal, Influenza, Hepatitis B, Td, Zostavax, HCV  Electrocardiogram  Cardiovascular Disease  Colorectal cancer screening  Bone density screening  Diabetes screening  Glaucoma screening  Mammography/PAP  Nutrition counseling   Patient Instructions (the written plan) was given to the patient.   Montine Circle, RN  06/10/2016

## 2016-06-10 NOTE — Progress Notes (Signed)
I have reviewed and agree with note, evaluation, plan. Glad to hear patient open to potential treatment for osteoporosis- discuss next vsiit. Hopeful can improve from weight perspective as well.   Tana ConchStephen Hunter, MD

## 2016-09-16 ENCOUNTER — Ambulatory Visit (INDEPENDENT_AMBULATORY_CARE_PROVIDER_SITE_OTHER): Payer: Medicare Other | Admitting: Family Medicine

## 2016-09-16 ENCOUNTER — Encounter: Payer: Self-pay | Admitting: Family Medicine

## 2016-09-16 DIAGNOSIS — E785 Hyperlipidemia, unspecified: Secondary | ICD-10-CM | POA: Diagnosis not present

## 2016-09-16 DIAGNOSIS — M810 Age-related osteoporosis without current pathological fracture: Secondary | ICD-10-CM | POA: Diagnosis not present

## 2016-09-16 DIAGNOSIS — I10 Essential (primary) hypertension: Secondary | ICD-10-CM | POA: Diagnosis not present

## 2016-09-16 MED ORDER — ALPRAZOLAM 0.25 MG PO TABS: ORAL_TABLET | ORAL | 0 refills | Status: DC

## 2016-09-16 NOTE — Progress Notes (Signed)
Pre visit review using our clinic review tool, if applicable. No additional management support is needed unless otherwise documented below in the visit note. 

## 2016-09-16 NOTE — Patient Instructions (Addendum)
Blood pressure up slightly - if up next time would consider medicine adjustment.   You opted not to treat thin bones with fosamax- let me know if you change your mind on this in the futuer

## 2016-09-16 NOTE — Assessment & Plan Note (Signed)
S: Has progressed to osteoprorosis. We sent in fosamax for patient. She states she 2 nieces in hospital after fosamax for chest pain- likely was GERD  Lumbar spine (L1-L4) Femoral neck (FN)  T-score -1.6 RFN: -1.6 LFN: -2.6  Change in BMD from previous DXA test (%) -0.5%  -4%   A/P: she firmly declines fosamax despite risks of fracture. States she would rather have fracture if that is "to be" than take this medicine. We spent most of our time taking fosamax- will need to make sure she is at least on calcium/vitamin D at follow up

## 2016-09-16 NOTE — Progress Notes (Signed)
Subjective:  Jenny [name] is a 83 y.o. year old very pleasant female patient who presents for/with See problem oriented charting ROS- No chest pain or shortness of breath. No headache or blurry vision.  No dizziness. No new fatigue.    Past Medical History-  Patient Active Problem List   Diagnosis Date Noted  . Hyperlipidemia 02/02/2007    Priority: Medium  . Essential hypertension 02/02/2007    Priority: Medium  . Osteopenia 02/02/2007    Priority: Medium  . Obesity, Class II, BMI 35-39.9, with comorbidity 03/17/2016    Priority: Low  . Anxiety state 09/13/2014    Priority: Low  . Arthritis of sacroiliac joint (HCC) 05/01/2009    Priority: Low    Medications- reviewed and updated Current Outpatient Prescriptions  Medication Sig Dispense Refill  . ALPRAZolam (XANAX) 0.25 MG tablet TAKE 1 TABLET EVERY 6 HOURS AS NEEDED 20 tablet 0  . amLODipine-olmesartan (AZOR) 5-40 MG tablet TAKE 1 TABLET BY MOUTH DAILY. 30 tablet 5  . aspirin 81 MG tablet Take 81 mg by mouth daily.    . bisoprolol-hydrochlorothiazide (ZIAC) 5-6.25 MG tablet Take 1 tablet by mouth daily. 30 tablet 5  . calcium-vitamin D (OSCAL WITH D 500-200) 500-200 MG-UNIT per tablet Take 1 tablet by mouth daily.      Marland Kitchen etodolac (LODINE) 400 MG tablet TAKE 1 TABLET DAILY 90 tablet 2  . fish oil-omega-3 fatty acids 1000 MG capsule Take 2 g by mouth daily.    . Multiple Vitamin (MULTIVITAMIN) tablet Take 1 tablet by mouth daily.      . Vitamin D, Ergocalciferol, (DRISDOL) 50000 UNITS CAPS capsule TAKE 1 CAPSULE EVERY WEEK 52 capsule 1   No current facility-administered medications for this visit.     Objective: BP (!) 144/92   Pulse 66   Temp 98.3 F (36.8 C) (Oral)   Wt 236 lb 12.8 oz (107.4 kg)   SpO2 96%   BMI 43.31 kg/m  Gen: NAD, resting comfortably CV: RRR no murmurs rubs or gallops Lungs: CTAB no crackles, wheeze, rhonchi Abdomen: soft/nontender/nondistended/normal bowel sounds. obese.  Ext: 1+ edema Skin:  warm, dry, no rash  Assessment/Plan:  Essential hypertension S: controlled poorly on amlodipine olmesartan 5-40mg  and bisoprolol hct 5-6.25mg .  Took both meds last night BP Readings from Last 3 Encounters:  09/16/16 (!) 144/92  06/10/16 124/84  03/17/16 132/78  A/P:Continue current meds:  Has been well controlled in the past- we opted to monitor only for now and consider adjustments only if remains elevated next visit  Also mild edema likely from amlodipine- ? If excess fluid- advised cutting down on salt intake- seemed to have a high salt meal right before swelling started (since swelling improving with elevation we opted to watch0. Strict return precautions given. Could add hctz 12.5mg  to current regimen  Hyperlipidemia S: Lipids are reasonable on fish oil alone Lab Results  Component Value Date   CHOL 170 03/31/2016   HDL 34.80 (L) 03/31/2016   LDLCALC 97 03/31/2016   LDLDIRECT 94.4 07/29/2013   TRIG 193.0 (H) 03/31/2016   CHOLHDL 5 03/31/2016  A/P: continue current medication- would not use statin given reasonable control and for primary prevention only   Osteopenia S: Has progressed to osteoprorosis. We sent in fosamax for patient. She states she 2 nieces in hospital after fosamax for chest pain- likely was GERD  Lumbar spine (L1-L4) Femoral neck (FN)  T-score -1.6 RFN: -1.6 LFN: -2.6  Change in BMD from previous DXA test (%) -  0.5%  -4%   A/P: she firmly declines fosamax despite risks of fracture. States she would rather have fracture if that is "to be" than take this medicine. We spent most of our time taking fosamax- will need to make sure she is at least on calcium/vitamin D at follow up    Return in about 4 months (around 01/14/2017) for follow up- come fasting and we will update labs.  Very sparing use of xanax- has been over 18 months since last #20 for anxiety Meds ordered this encounter  Medications  . ALPRAZolam (XANAX) 0.25 MG tablet    Sig: TAKE 1 TABLET  EVERY 6 HOURS AS NEEDED    Dispense:  20 tablet    Refill:  0   Return precautions advised.  Tana ConchStephen Khyleigh Furney, MD

## 2016-09-16 NOTE — Assessment & Plan Note (Signed)
S: Lipids are reasonable on fish oil alone Lab Results  Component Value Date   CHOL 170 03/31/2016   HDL 34.80 (L) 03/31/2016   LDLCALC 97 03/31/2016   LDLDIRECT 94.4 07/29/2013   TRIG 193.0 (H) 03/31/2016   CHOLHDL 5 03/31/2016  A/P: continue current medication- would not use statin given reasonable control and for primary prevention only

## 2016-09-16 NOTE — Assessment & Plan Note (Addendum)
S: controlled poorly on amlodipine olmesartan 5-40mg  and bisoprolol hct 5-6.25mg .  Took both meds last night BP Readings from Last 3 Encounters:  09/16/16 (!) 144/92  06/10/16 124/84  03/17/16 132/78  A/P:Continue current meds:  Has been well controlled in the past- we opted to monitor only for now and consider adjustments only if remains elevated next visit  Also mild edema likely from amlodipine- ? If excess fluid- advised cutting down on salt intake- seemed to have a high salt meal right before swelling started (since swelling improving with elevation we opted to watch0. Strict return precautions given. Could add hctz 12.5mg  to current regimen

## 2016-10-14 ENCOUNTER — Other Ambulatory Visit: Payer: Self-pay | Admitting: Family Medicine

## 2016-11-14 ENCOUNTER — Encounter: Payer: Self-pay | Admitting: Family Medicine

## 2016-11-14 ENCOUNTER — Ambulatory Visit (INDEPENDENT_AMBULATORY_CARE_PROVIDER_SITE_OTHER): Payer: Medicare Other | Admitting: Family Medicine

## 2016-11-14 VITALS — BP 138/84 | HR 76 | Temp 98.5°F | Ht 62.0 in | Wt 241.8 lb

## 2016-11-14 DIAGNOSIS — B9689 Other specified bacterial agents as the cause of diseases classified elsewhere: Secondary | ICD-10-CM | POA: Diagnosis not present

## 2016-11-14 DIAGNOSIS — J329 Chronic sinusitis, unspecified: Secondary | ICD-10-CM | POA: Diagnosis not present

## 2016-11-14 MED ORDER — AMOXICILLIN-POT CLAVULANATE 875-125 MG PO TABS: 1.0000 | ORAL_TABLET | Freq: Two times a day (BID) | ORAL | 0 refills | Status: DC

## 2016-11-14 NOTE — Progress Notes (Signed)
Pre visit review using our clinic review tool, if applicable. No additional management support is needed unless otherwise documented below in the visit note. 

## 2016-11-14 NOTE — Patient Instructions (Signed)
Sinsusitis Bacterial based on: Symptoms =10 days and still worsening  Treatment: -considered steroid: we opted out as may increase weight -other symptomatic care with mucinex or mucinex-DM. Do not take mucinex-D. -Antibiotic indicated: yes  Finally, we reviewed reasons to return to care including if symptoms worsen or persist or new concerns arise (particularly fever or shortness of breath)  Meds ordered this encounter  Medications  . amoxicillin-clavulanate (AUGMENTIN) 875-125 MG tablet    Sig: Take 1 tablet by mouth 2 (two) times daily.    Dispense:  14 tablet    Refill:  0

## 2016-11-14 NOTE — Progress Notes (Signed)
PCP: [name], MD  Subjective:  Jenny [name] is a 83 y.o. year old very pleasant female patient who presents with sinusitis symptoms including Nasal congestion, cough, tight in head.clear. Yellow to sputum. . Worsening.  -other symptoms include: Feels like some chest congestion starting -day of illness:10d -Symptoms are worsening -previous treatments: robitussen and cough drops.  -sick contacts/travel/risks: denies flu exposure.  Multiple sick contacts- a lot of sinus infections. -Hx of: allergies  ROS-denies fever, SOB, NVD, tooth pain, wheeze  Pertinent Past Medical History-  Patient Active Problem List   Diagnosis Date Noted  . Hyperlipidemia 02/02/2007    Priority: Medium  . Essential hypertension 02/02/2007    Priority: Medium  . Osteoporosis 02/02/2007    Priority: Medium  . Obesity, Class II, BMI 35-39.9, with comorbidity 03/17/2016    Priority: Low  . Anxiety state 09/13/2014    Priority: Low  . Arthritis of sacroiliac joint (HCC) 05/01/2009    Priority: Low    Medications- reviewed  Current Outpatient Prescriptions  Medication Sig Dispense Refill  . ALPRAZolam (XANAX) 0.25 MG tablet TAKE 1 TABLET EVERY 6 HOURS AS NEEDED 20 tablet 0  . amLODipine-olmesartan (AZOR) 5-40 MG tablet TAKE 1 TABLET BY MOUTH DAILY. 30 tablet 5  . aspirin 81 MG tablet Take 81 mg by mouth daily.    . bisoprolol-hydrochlorothiazide (ZIAC) 5-6.25 MG tablet TAKE 1 TABLET EVERY DAY 30 tablet 1  . calcium-vitamin D (OSCAL WITH D 500-200) 500-200 MG-UNIT per tablet Take 1 tablet by mouth daily.      Marland Kitchen etodolac (LODINE) 400 MG tablet TAKE 1 TABLET DAILY 90 tablet 2  . fish oil-omega-3 fatty acids 1000 MG capsule Take 2 g by mouth daily.    . Multiple Vitamin (MULTIVITAMIN) tablet Take 1 tablet by mouth daily.      . Vitamin D, Ergocalciferol, (DRISDOL) 50000 UNITS CAPS capsule TAKE 1 CAPSULE EVERY WEEK 52 capsule 1   No current facility-administered medications for this visit.      Objective: BP 138/84   Pulse 76   Temp 98.5 F (36.9 C) (Oral)   Ht  (1.575 m)   Wt 241 lb 12.8 oz (109.7 kg)   SpO2 95%   BMI 44.23 kg/m  Gen: NAD, resting comfortably HEENT: Turbinates erythematous with yellow drainage, TM normal, pharynx mildly erythematous with no tonsilar exudate or edema, left maxillary sinus tenderness CV: RRR no murmurs rubs or gallops Lungs: CTAB no crackles, wheeze, rhonchi Abdomen: obese Ext: 1+ edema Skin: warm, dry, no rash  Assessment/Plan:  Sinsusitis Bacterial based on: Symptoms =10 days and still worsening  Treatment: -considered steroid: we opted out as may increase weight -other symptomatic care with mucinex or mucinex-DM. Do not take mucinex-D. -Antibiotic indicated: yes  Finally, we reviewed reasons to return to care including if symptoms worsen or persist or new concerns arise (particularly fever or shortness of breath)  Meds ordered this encounter  Medications  . amoxicillin-clavulanate (AUGMENTIN) 875-125 MG tablet    Sig: Take 1 tablet by mouth 2 (two) times daily.    Dispense:  14 tablet    Refill:  0    [name], MD

## 2016-12-08 ENCOUNTER — Telehealth: Payer: Self-pay | Admitting: Family Medicine

## 2016-12-08 NOTE — Telephone Encounter (Signed)
Before adjustments would be best to measure current control in office. That being said the fact her blood pressure calms down with thinking about the beach likely means her blood pressure is well controlled- it should be measured when you are at rest and in a restful mindset.   If we increase her medicine at this point and it is not in fact low- then we may make her blood pressure go low and cause falls or lightheadedness. Once again- I would advise in person evaluation and discussion before we go down this path.

## 2016-12-08 NOTE — Telephone Encounter (Signed)
Pt is calling stating that her Bp has been going up it has been 149/105 and then after sitting for 45 min thinking about the beach it went down to 125/83.  She was wondering if Dr. Durene CalHunter would adjust her medication.  Pt refuse appointment.

## 2016-12-09 NOTE — Telephone Encounter (Signed)
I scheduled her to be seen this Friday. I encouraged her to keep a log of her BP until her visit to bring to show Dr. Durene CalHunter. I also advised that if her symptoms worsen or new symptoms develop to seek help immediately. She verbalized understanding

## 2016-12-09 NOTE — Telephone Encounter (Signed)
Spoke with patient who states she is feeling much better today. She states she has an appointment at the end of June but she will make an appointment now to be seen.

## 2016-12-10 ENCOUNTER — Other Ambulatory Visit: Payer: Self-pay | Admitting: Family Medicine

## 2016-12-12 ENCOUNTER — Encounter: Payer: Self-pay | Admitting: Family Medicine

## 2016-12-12 ENCOUNTER — Ambulatory Visit (INDEPENDENT_AMBULATORY_CARE_PROVIDER_SITE_OTHER): Payer: Medicare Other | Admitting: Family Medicine

## 2016-12-12 DIAGNOSIS — I10 Essential (primary) hypertension: Secondary | ICD-10-CM

## 2016-12-12 DIAGNOSIS — M810 Age-related osteoporosis without current pathological fracture: Secondary | ICD-10-CM

## 2016-12-12 MED ORDER — ALENDRONATE SODIUM 70 MG PO TABS: 70.0000 mg | ORAL_TABLET | ORAL | 11 refills | Status: DC

## 2016-12-12 NOTE — Assessment & Plan Note (Signed)
S: controlled poorly on initial check but better on repeat on amlodipine-olmesartan 5-40mg  and bisoprolol hct 5-6.25mg .   Home #s range anywhere from 120s to 150s and 70s to 90s.   Has had mild edema in the past- could be from amlodipine but also advised low salt diet.  .  ASCVD 10 year risk calculation if age 81-79: above age.  BP Readings from Last 3 Encounters:  12/12/16 128/80  11/14/16 138/84  09/16/16 (!) 144/92  A/P: We discussed blood pressure goal of <150/90 though <140 more ideal. Continue current medications as blood pressure looks better on repeat. Bring home cuff to next visit- wonder if home cuff measures higher than  Our readings

## 2016-12-12 NOTE — Assessment & Plan Note (Signed)
[id number] with -2.6 in LFN 03/28/2009 DEXA with no t score less than -2.  Compliant with vitamin D and calcium 500mg  daily A/P: agrees to trial fosamax though previously strongly declined. Continue calcium and vitamin D. Should get extra 700 mg calcium through diet and handout given.

## 2016-12-12 NOTE — Assessment & Plan Note (Signed)
S: patient has lost 8 lbs with effort- congratulated Wt Readings from Last 3 Encounters:  12/12/16 233 lb 9.6 oz (106 kg)  11/14/16 241 lb 12.8 oz (109.7 kg)  09/16/16 236 lb 12.8 oz (107.4 kg)  A/P: Encouraged need for healthy eating, regular exercise, weight loss.

## 2016-12-12 NOTE — Progress Notes (Signed)
Subjective:  Betsy CoderMary A Manley is a 83 y.o. year old very pleasant female patient who presents for/with See problem oriented charting ROS- No chest pain or shortness of breath. No headache or blurry vision.    Past Medical History-  Patient Active Problem List   Diagnosis Date Noted  . Hyperlipidemia 02/02/2007    Priority: Medium  . Essential hypertension 02/02/2007    Priority: Medium  . Osteoporosis 02/02/2007    Priority: Medium  . Morbid obesity (HCC) 03/17/2016    Priority: Low  . Anxiety state 09/13/2014    Priority: Low  . Arthritis of sacroiliac joint (HCC) 05/01/2009    Priority: Low    Medications- reviewed and updated Current Outpatient Prescriptions  Medication Sig Dispense Refill  . ALPRAZolam (XANAX) 0.25 MG tablet TAKE 1 TABLET EVERY 6 HOURS AS NEEDED 20 tablet 0  . amLODipine-olmesartan (AZOR) 5-40 MG tablet TAKE 1 TABLET BY MOUTH DAILY. 30 tablet 5  . aspirin 81 MG tablet Take 81 mg by mouth daily.    . bisoprolol-hydrochlorothiazide (ZIAC) 5-6.25 MG tablet TAKE 1 TABLET BY MOUTH EVERY DAY 30 tablet 1  . calcium-vitamin D (OSCAL WITH D 500-200) 500-200 MG-UNIT per tablet Take 1 tablet by mouth daily.      Marland Kitchen. etodolac (LODINE) 400 MG tablet TAKE 1 TABLET DAILY 90 tablet 2  . fish oil-omega-3 fatty acids 1000 MG capsule Take 2 g by mouth daily.    . Multiple Vitamin (MULTIVITAMIN) tablet Take 1 tablet by mouth daily.      . Vitamin D, Ergocalciferol, (DRISDOL) 50000 UNITS CAPS capsule TAKE 1 CAPSULE EVERY WEEK 52 capsule 1   No current facility-administered medications for this visit.     Objective: BP 128/80   Pulse 79   Temp 98.3 F (36.8 C) (Oral)   Ht 5\' 2"  (1.575 m)   Wt 233 lb 9.6 oz (106 kg)   SpO2 94%   BMI 42.73 kg/m  Gen: NAD, resting comfortably CV: RRR no murmurs rubs or gallops Lungs: CTAB no crackles, wheeze, rhonchi Abdomen: morbid obesity Ext: no edema Skin: warm, dry  Assessment/Plan:  Essential hypertension S: controlled poorly on  initial check but better on repeat on amlodipine-olmesartan 5-40mg  and bisoprolol hct 5-6.25mg .   Home #s range anywhere from 120s to 150s and 70s to 90s.   Has had mild edema in the past- could be from amlodipine but also advised low salt diet.  .  ASCVD 10 year risk calculation if age 81-79: above age.  BP Readings from Last 3 Encounters:  12/12/16 128/80  11/14/16 138/84  09/16/16 (!) 144/92  A/P: We discussed blood pressure goal of <150/90 though <140 more ideal. Continue current medications as blood pressure looks better on repeat. Bring home cuff to next visit- wonder if home cuff measures higher than  Our readings   Osteoporosis S:2017 with -2.6 in LFN 03/28/2009 DEXA with no t score less than -2.  Compliant with vitamin D and calcium 500mg  daily A/P: agrees to trial fosamax though previously strongly declined. Continue calcium and vitamin D. Should get extra 700 mg calcium through diet and handout given.    Morbid obesity (HCC) S: patient has lost 8 lbs with effort- congratulated Wt Readings from Last 3 Encounters:  12/12/16 233 lb 9.6 oz (106 kg)  11/14/16 241 lb 12.8 oz (109.7 kg)  09/16/16 236 lb 12.8 oz (107.4 kg)  A/P: Encouraged need for healthy eating, regular exercise, weight loss.   3 months  Meds ordered this  encounter  Medications  . alendronate (FOSAMAX) 70 MG tablet    Sig: Take 1 tablet (70 mg total) by mouth every 7 (seven) days. Take with a full glass of water on an empty stomach.    Dispense:  4 tablet    Refill:  11    Return precautions advised.  [name], MD

## 2016-12-12 NOTE — Patient Instructions (Addendum)
We discussed blood pressure goal of <150/90 though <140 more ideal. Continue current medications as blood pressure looks better on repeat. Bring home cuff to next visit- wonder if home cuff measures higher than  Our readings  Great job losing 8 lbs! Keep up the great effort  At a minimum, recommend 800 IU of vitamin D and 1200mg  of Calcium per day. You can get this with a calcium-vitamin D supplement with additional 700mg  calcium in diet (or at least get close)  Once you have the above in place, I would start taking fosamax 70mg  once a week.  Administer first thing in the morning and >30 minutes before the first food, beverage (except plain water), or other medication of the day. Do not take with mineral water or with other beverages. Stay upright (not to lie down) for at least 30 minutes after taking medicine and until after first food of the day (to reduce irritation). Must be taken with 6 to 8 oz of plain water. The tablet should be swallowed whole; do not chew or suck.

## 2017-01-14 ENCOUNTER — Ambulatory Visit (INDEPENDENT_AMBULATORY_CARE_PROVIDER_SITE_OTHER): Payer: Medicare Other | Admitting: Family Medicine

## 2017-01-14 ENCOUNTER — Encounter: Payer: Self-pay | Admitting: Family Medicine

## 2017-01-14 VITALS — BP 138/92 | HR 66 | Temp 98.6°F | Ht 62.0 in | Wt 232.8 lb

## 2017-01-14 DIAGNOSIS — R5383 Other fatigue: Secondary | ICD-10-CM

## 2017-01-14 DIAGNOSIS — M810 Age-related osteoporosis without current pathological fracture: Secondary | ICD-10-CM

## 2017-01-14 DIAGNOSIS — I10 Essential (primary) hypertension: Secondary | ICD-10-CM

## 2017-01-14 DIAGNOSIS — R0602 Shortness of breath: Secondary | ICD-10-CM | POA: Diagnosis not present

## 2017-01-14 DIAGNOSIS — D72829 Elevated white blood cell count, unspecified: Secondary | ICD-10-CM

## 2017-01-14 LAB — CBC WITH DIFFERENTIAL/PLATELET
BASOS ABS: 0.1 10*3/uL (ref 0.0–0.1)
Basophils Relative: 0.7 % (ref 0.0–3.0)
Eosinophils Absolute: 0.1 10*3/uL (ref 0.0–0.7)
Eosinophils Relative: 1.2 % (ref 0.0–5.0)
HCT: 44.6 % (ref 36.0–46.0)
Hemoglobin: 14.3 g/dL (ref 12.0–15.0)
LYMPHS ABS: 1.6 10*3/uL (ref 0.7–4.0)
Lymphocytes Relative: 17.7 % (ref 12.0–46.0)
MCHC: 32 g/dL (ref 30.0–36.0)
MCV: 83.5 fl (ref 78.0–100.0)
MONO ABS: 1.1 10*3/uL — AB (ref 0.1–1.0)
Monocytes Relative: 11.9 % (ref 3.0–12.0)
NEUTROS ABS: 6.1 10*3/uL (ref 1.4–7.7)
NEUTROS PCT: 68.5 % (ref 43.0–77.0)
PLATELETS: 249 10*3/uL (ref 150.0–400.0)
RBC: 5.34 Mil/uL — AB (ref 3.87–5.11)
RDW: 16.5 % — ABNORMAL HIGH (ref 11.5–15.5)
WBC: 9 10*3/uL (ref 4.0–10.5)

## 2017-01-14 LAB — TSH: TSH: 1.86 u[IU]/mL (ref 0.35–4.50)

## 2017-01-14 LAB — COMPREHENSIVE METABOLIC PANEL
ALK PHOS: 48 U/L (ref 39–117)
ALT: 13 U/L (ref 0–35)
AST: 22 U/L (ref 0–37)
Albumin: 4 g/dL (ref 3.5–5.2)
BILIRUBIN TOTAL: 1.7 mg/dL — AB (ref 0.2–1.2)
BUN: 13 mg/dL (ref 6–23)
CALCIUM: 9.8 mg/dL (ref 8.4–10.5)
CO2: 31 mEq/L (ref 19–32)
CREATININE: 0.9 mg/dL (ref 0.40–1.20)
Chloride: 100 mEq/L (ref 96–112)
GFR: 63.85 mL/min (ref >60.00)
Glucose, Bld: 115 mg/dL — ABNORMAL HIGH (ref 70–99)
Potassium: 4.1 mEq/L (ref 3.5–5.1)
Sodium: 140 mEq/L (ref 135–145)
TOTAL PROTEIN: 6.6 g/dL (ref 6.0–8.3)

## 2017-01-14 LAB — VITAMIN D 25 HYDROXY (VIT D DEFICIENCY, FRACTURES): VITD: 54.81 ng/mL (ref 30.00–100.00)

## 2017-01-14 LAB — BRAIN NATRIURETIC PEPTIDE: PRO B NATRI PEPTIDE: 389 pg/mL — AB (ref 0.0–100.0)

## 2017-01-14 MED ORDER — VITAMIN D (ERGOCALCIFEROL) 1.25 MG (50000 UNIT) PO CAPS: ORAL_CAPSULE | ORAL | 0 refills | Status: DC

## 2017-01-14 NOTE — Assessment & Plan Note (Signed)
S: compliant with calcium, vitamin D. Didn't start fosamax yet due to fatigue A/P: will check vitamin D- once feeling better will encourage fosamax trial On high dose vitamin D- will check levels

## 2017-01-14 NOTE — Assessment & Plan Note (Signed)
S: controlled poorly this AM on azor and ziac. Home #s sometimes controlled but last night 145/92. Intentionally losing weight.  ASCVD 10 year risk calculation if age 81-79: above age BP Readings from Last 3 Encounters:  01/14/17 (!) 138/92  12/12/16 128/80  11/14/16 138/84  A/P: We discussed blood pressure goal of <140/90 preferred but would tolerate up to 150/90 [facility]. Continue current meds for now- she is losing weight and wants to continue efforts plus last 2 BPs controlled

## 2017-01-14 NOTE — Patient Instructions (Addendum)
Please stop by lab before you go  Stop lodine to see if this helps  [facility]  If you have new or worsening symptoms let me know  Follow up in 1 month

## 2017-01-14 NOTE — Progress Notes (Signed)
Subjective:  Jenny [name] is a 83 y.o. year old very pleasant female patient who presents for/with See problem oriented charting ROS- feels fatigued and mildly winded with exertion. No chest pain. Edema new within 6 months.    Past Medical History-  Patient Active Problem List   Diagnosis Date Noted  . Hyperlipidemia 02/02/2007    Priority: Medium  . Essential hypertension 02/02/2007    Priority: Medium  . Osteoporosis 02/02/2007    Priority: Medium  . Morbid obesity (HCC) 03/17/2016    Priority: Low  . Anxiety state 09/13/2014    Priority: Low  . Arthritis of sacroiliac joint (HCC) 05/01/2009    Priority: Low    Medications- reviewed and updated Current Outpatient Prescriptions  Medication Sig Dispense Refill  . ALPRAZolam (XANAX) 0.25 MG tablet TAKE 1 TABLET EVERY 6 HOURS AS NEEDED 20 tablet 0  . amLODipine-olmesartan (AZOR) 5-40 MG tablet TAKE 1 TABLET BY MOUTH DAILY. 30 tablet 5  . aspirin 81 MG tablet Take 81 mg by mouth daily.    . bisoprolol-hydrochlorothiazide (ZIAC) 5-6.25 MG tablet TAKE 1 TABLET BY MOUTH EVERY DAY 30 tablet 1  . calcium-vitamin D (OSCAL WITH D 500-200) 500-200 MG-UNIT per tablet Take 1 tablet by mouth daily.      Marland Kitchen etodolac (LODINE) 400 MG tablet TAKE 1 TABLET DAILY 90 tablet 2  . fish oil-omega-3 fatty acids 1000 MG capsule Take 2 g by mouth daily.    . Multiple Vitamin (MULTIVITAMIN) tablet Take 1 tablet by mouth daily.      . Vitamin D, Ergocalciferol, (DRISDOL) 50000 UNITS CAPS capsule TAKE 1 CAPSULE EVERY WEEK 52 capsule 1  . alendronate (FOSAMAX) 70 MG tablet Take 1 tablet (70 mg total) by mouth every 7 (seven) days. Take with a full glass of water on an empty stomach. (Patient not taking: Reported on 01/14/2017) 4 tablet 11    Objective: BP (!) 138/92   Pulse 66   Temp 98.6 F (37 C) (Oral)   Ht 5\' 2"  (1.575 m)   Wt 232 lb 12.8 oz (105.6 kg)   SpO2 94%   BMI 42.58 kg/m  Gen: NAD, resting comfortably CV: RRR no murmurs rubs or  gallops Lungs: CTAB no crackles, wheeze, rhonchi Abdomen: soft/nontender/nondistended/normal bowel sounds. No rebound or guarding.  Ext: 1+ edema Skin: warm, dry  Assessment/Plan:  Other fatigue Leukocytosis S: 2 months of feeling slightly more fatigued than normal. Doesn't feel like being active. Balance slightly off. No anhedonia. Denies down, depressed, hopeless feeling. Feels mildly short of breath bringing groceries in- new for her. Gradually worsening over time. Not exercising. No chest pain. New edema this year but stable at 1+  September visit noted leukopenia mild.   Feels tired after lodine.  A/P: fatigue and mild SOB of unclear cause with only exam finding of 1+ edema which is new this year - stop lodine since feles more fatigued after this - will check TSh, cbc, cmp, vitamin D and BMP  -considering echo and CXR- particularly if BNP elevated or symptoms do not improve - trial 5-10 minutes exercise daily at [facility] center  Essential hypertension S: controlled poorly this AM on azor and ziac. Home #s sometimes controlled but last night 145/92. Intentionally losing weight.  ASCVD 10 year risk calculation if age 59-79: above age BP Readings from Last 3 Encounters:  01/14/17 (!) 138/92  12/12/16 128/80  11/14/16 138/84  A/P: We discussed blood pressure goal of <140/90 preferred but would tolerate up to 150/90  per JNC8. Continue current meds for now- she is losing weight and wants to continue efforts plus last 2 BPs controlled  Osteoporosis S: compliant with calcium, vitamin D. Didn't start fosamax yet due to fatigue A/P: will check vitamin D- once feeling better will encourage fosamax trial On high dose vitamin D- will check levels  1 month  Orders Placed This Encounter  Procedures  . Comprehensive metabolic panel    [place]  . TSH    [place]  . Brain Natriuretic Peptide  . VITAMIN D 25 Hydroxy (Vit-D Deficiency, Fractures)    [place]  . CBC with  Differential/Platelet    Meds ordered this encounter  Medications  . Vitamin D, Ergocalciferol, (DRISDOL) 50000 units CAPS capsule    Sig: TAKE 1 CAPSULE EVERY WEEK    Dispense:  52 capsule    Refill:  0    Return precautions advised.  Tana ConchStephen Calianne Larue, MD

## 2017-01-15 ENCOUNTER — Other Ambulatory Visit: Payer: Self-pay

## 2017-01-15 MED ORDER — VITAMIN D (ERGOCALCIFEROL) 1.25 MG (50000 UNIT) PO CAPS: ORAL_CAPSULE | ORAL | 0 refills | Status: AC

## 2017-02-08 ENCOUNTER — Other Ambulatory Visit: Payer: Self-pay | Admitting: Family Medicine

## 2017-02-24 ENCOUNTER — Ambulatory Visit (INDEPENDENT_AMBULATORY_CARE_PROVIDER_SITE_OTHER): Payer: Medicare Other | Admitting: Family Medicine

## 2017-02-24 ENCOUNTER — Encounter: Payer: Self-pay | Admitting: Family Medicine

## 2017-02-24 VITALS — BP 144/84 | HR 71 | Temp 98.5°F | Ht 62.0 in | Wt 225.4 lb

## 2017-02-24 DIAGNOSIS — R053 Chronic cough: Secondary | ICD-10-CM

## 2017-02-24 DIAGNOSIS — R05 Cough: Secondary | ICD-10-CM

## 2017-02-24 DIAGNOSIS — M810 Age-related osteoporosis without current pathological fracture: Secondary | ICD-10-CM | POA: Diagnosis not present

## 2017-02-24 DIAGNOSIS — R17 Unspecified jaundice: Secondary | ICD-10-CM | POA: Diagnosis not present

## 2017-02-24 DIAGNOSIS — I10 Essential (primary) hypertension: Secondary | ICD-10-CM

## 2017-02-24 LAB — CBC WITH DIFFERENTIAL/PLATELET
BASOS ABS: 0.1 10*3/uL (ref 0.0–0.1)
Basophils Relative: 1.2 % (ref 0.0–3.0)
EOS PCT: 1.2 % (ref 0.0–5.0)
Eosinophils Absolute: 0.1 10*3/uL (ref 0.0–0.7)
HCT: 45.5 % (ref 36.0–46.0)
HEMOGLOBIN: 14.2 g/dL (ref 12.0–15.0)
LYMPHS ABS: 1.4 10*3/uL (ref 0.7–4.0)
Lymphocytes Relative: 16.3 % (ref 12.0–46.0)
MCHC: 31.3 g/dL (ref 30.0–36.0)
MCV: 83.8 fl (ref 78.0–100.0)
MONO ABS: 1 10*3/uL (ref 0.1–1.0)
MONOS PCT: 11.7 % (ref 3.0–12.0)
Neutro Abs: 6.1 10*3/uL (ref 1.4–7.7)
Neutrophils Relative %: 69.6 % (ref 43.0–77.0)
PLATELETS: 262 10*3/uL (ref 150.0–400.0)
RBC: 5.42 Mil/uL — AB (ref 3.87–5.11)
RDW: 16.9 % — ABNORMAL HIGH (ref 11.5–15.5)
WBC: 8.8 10*3/uL (ref 4.0–10.5)

## 2017-02-24 LAB — COMPREHENSIVE METABOLIC PANEL
ALT: 13 U/L (ref 0–35)
AST: 22 U/L (ref 0–37)
Albumin: 3.9 g/dL (ref 3.5–5.2)
Alkaline Phosphatase: 59 U/L (ref 39–117)
BILIRUBIN TOTAL: 1.7 mg/dL — AB (ref 0.2–1.2)
BUN: 13 mg/dL (ref 6–23)
CALCIUM: 9.6 mg/dL (ref 8.4–10.5)
CHLORIDE: 99 meq/L (ref 96–112)
CO2: 31 meq/L (ref 19–32)
Creatinine, Ser: 0.78 mg/dL (ref 0.40–1.20)
GFR: 75.29 mL/min (ref >60.00)
GLUCOSE: 98 mg/dL (ref 70–99)
POTASSIUM: 4.2 meq/L (ref 3.5–5.1)
Sodium: 138 mEq/L (ref 135–145)
Total Protein: 6.9 g/dL (ref 6.0–8.3)

## 2017-02-24 LAB — GAMMA GT: GGT: 28 U/L (ref 7–51)

## 2017-02-24 MED ORDER — ALPRAZOLAM 0.25 MG PO TABS: ORAL_TABLET | ORAL | 0 refills | Status: DC

## 2017-02-24 MED ORDER — FLUTICASONE PROPIONATE 50 MCG/ACT NA SUSP: 2.0000 | Freq: Every day | NASAL | 3 refills | Status: DC

## 2017-02-24 NOTE — Progress Notes (Signed)
Subjective:  Jenny [name] is a 83 y.o. year old very pleasant female patient who presents for/with See problem oriented charting ROS- fatigue better. Edema slightly better. No chest pain. Cough without shortness of breath   Past Medical History-  Patient Active Problem List   Diagnosis Date Noted  . Hyperlipidemia 02/02/2007    Priority: Medium  . Essential hypertension 02/02/2007    Priority: Medium  . Osteoporosis 02/02/2007    Priority: Medium  . Morbid obesity (HCC) 03/17/2016    Priority: Low  . Anxiety state 09/13/2014    Priority: Low  . Arthritis of sacroiliac joint (HCC) 05/01/2009    Priority: Low    Medications- reviewed and updated Current Outpatient Prescriptions  Medication Sig Dispense Refill  . ALPRAZolam (XANAX) 0.25 MG tablet TAKE 1 TABLET EVERY 6 HOURS AS NEEDED 20 tablet 0  . amLODipine-olmesartan (AZOR) 5-40 MG tablet TAKE 1 TABLET BY MOUTH DAILY. 30 tablet 5  . aspirin 81 MG tablet Take 81 mg by mouth daily.    . bisoprolol-hydrochlorothiazide (ZIAC) 5-6.25 MG tablet TAKE 1 TABLET BY MOUTH EVERY DAY 90 tablet 1  . calcium-vitamin D (OSCAL WITH D 500-200) 500-200 MG-UNIT per tablet Take 1 tablet by mouth daily.      . fish oil-omega-3 fatty acids 1000 MG capsule Take 2 g by mouth daily.    . Multiple Vitamin (MULTIVITAMIN) tablet Take 1 tablet by mouth daily.      . Vitamin D, Ergocalciferol, (DRISDOL) 50000 units CAPS capsule TAKE 1 CAPSULE EVERY OTHER WEEK 52 capsule 0  . fluticasone (FLONASE) 50 MCG/ACT nasal spray Place 2 sprays into both nostrils daily. 16 g 3   No current facility-administered medications for this visit.     Objective: BP (!) 144/84   Pulse 71   Temp 98.5 F (36.9 C) (Oral)   Ht 5\' 2"  (1.575 m)   Wt 225 lb 6.4 oz (102.2 kg)   SpO2 95%   BMI 41.23 kg/m  Gen: NAD, resting comfortably CV: RRR no murmurs rubs or gallops Lungs: CTAB no crackles, wheeze, rhonchi Abdomen: soft/nontender/nondistended/normal bowel sounds. No  rebound or guarding. No hepatosplenomegaly. Morbid obesity.  Ext: 1+ pitting edema Skin: warm, dry, no rash  Assessment/Plan:  Chronic cough S: prior fatigue has resolved largely- doing 5-10 minutes exercise at home each day and has improved energy- also stopping lodine helped. Leukocytosis resolved on last labs.  Now states has had dry cough in morning and at night for at least 5-6 months. No issues during the day A/P: will get CXR to evluate pneumonia or mass though doubt- could be allergy related- trial flonase  Elevated bilirubin - Plan: Comprehensive metabolic panel Isolated elevation last labs- update today  Essential hypertension S: controlled on azor and ziac. Per JNC 8  BP Readings from Last 3 Encounters:  02/24/17 (!) 144/84  01/14/17 (!) 138/92  12/12/16 128/80  A/P: We discussed blood pressure goal of <150/90 but would prefer <140/90. Continue current meds:  Losing weight- so will continue to monitor- states eating much better.   Osteoporosis Didn't start fosamax- encouraged her to start. Glad she is up doing home exercises.   3 months  Orders Placed This Encounter  Procedures  . DG Chest 2 View    Standing Status:   Future    Standing Expiration Date:   04/26/2018    Order Specific Question:   Reason for Exam (SYMPTOM  OR DIAGNOSIS REQUIRED)    Answer:   cough 5-6 months. has  been losing weight but states intentional    Order Specific Question:   Preferred imaging location?    Answer:   [facility]  . Comprehensive metabolic panel    [place]    Meds ordered this encounter  Medications  . fluticasone (FLONASE) 50 MCG/ACT nasal spray    Sig: Place 2 sprays into both nostrils daily.    Dispense:  16 g    Refill:  3  . ALPRAZolam (XANAX) 0.25 MG tablet    Sig: TAKE 1 TABLET EVERY 6 HOURS AS NEEDED    Dispense:  20 tablet    Refill:  0    Return precautions advised.  [name], MD

## 2017-02-24 NOTE — Assessment & Plan Note (Signed)
S: controlled on azor and ziac. Per JNC 8  BP Readings from Last 3 Encounters:  02/24/17 (!) 144/84  01/14/17 (!) 138/92  12/12/16 128/80  A/P: We discussed blood pressure goal of <150/90 but would prefer <140/90. Continue current meds:  Losing weight- so will continue to monitor- states eating much better.

## 2017-02-24 NOTE — Patient Instructions (Addendum)
Please stop by lab before you go  Start fosamax. Checking on bilirubin- may need more workup depending on results  Please go to [facility] - located [address] [facility] - in the [facility] - Hours: 8:30-5:30 PM M-F. Do not need appointment.   Trial flonase for chronic cough as long as x-ray negative

## 2017-02-24 NOTE — Assessment & Plan Note (Signed)
Didn't start fosamax- encouraged her to start. Glad she is up doing home exercises.

## 2017-02-25 ENCOUNTER — Telehealth: Payer: Self-pay | Admitting: Family Medicine

## 2017-02-25 NOTE — Telephone Encounter (Signed)
Okay for transfer 

## 2017-02-25 NOTE — Telephone Encounter (Signed)
Ok with me if ok with Dr. [place]. She is very sweet- I will miss her.

## 2017-02-25 NOTE — Telephone Encounter (Signed)
Pt would like to switch to dr jordan. Can I  sch?  °

## 2017-02-27 NOTE — Telephone Encounter (Signed)
It is Ok with me. Thanks, BJ 

## 2017-03-02 NOTE — Telephone Encounter (Signed)
Pt has been sch for nov °

## 2017-03-03 ENCOUNTER — Ambulatory Visit (INDEPENDENT_AMBULATORY_CARE_PROVIDER_SITE_OTHER)
Admission: RE | Admit: 2017-03-03 | Discharge: 2017-03-03 | Disposition: A | Discharge status: Home / Self Care | Payer: Medicare Other | Source: Ambulatory Visit | Attending: Family Medicine | Admitting: Family Medicine

## 2017-03-03 DIAGNOSIS — R05 Cough: Secondary | ICD-10-CM | POA: Diagnosis not present

## 2017-03-03 DIAGNOSIS — R053 Chronic cough: Secondary | ICD-10-CM

## 2017-03-06 ENCOUNTER — Encounter: Payer: Self-pay | Admitting: Family Medicine

## 2017-03-06 ENCOUNTER — Ambulatory Visit (INDEPENDENT_AMBULATORY_CARE_PROVIDER_SITE_OTHER): Payer: Medicare Other | Admitting: Family Medicine

## 2017-03-06 VITALS — BP 140/78 | HR 78 | Temp 98.5°F | Ht 62.0 in | Wt 220.8 lb

## 2017-03-06 DIAGNOSIS — J90 Pleural effusion, not elsewhere classified: Secondary | ICD-10-CM

## 2017-03-06 MED ORDER — AMOXICILLIN-POT CLAVULANATE 875-125 MG PO TABS: 1.0000 | ORAL_TABLET | Freq: Two times a day (BID) | ORAL | 0 refills | Status: AC

## 2017-03-06 MED ORDER — AZITHROMYCIN 250 MG PO TABS: ORAL_TABLET | ORAL | 0 refills | Status: DC

## 2017-03-06 NOTE — Progress Notes (Addendum)
Subjective:  Jenny [name] is a 83 y.o. year old very pleasant female patient who presents for/with See problem oriented charting ROS- continued mild cough. No chest pain or shortness of breath. No worsening edema. Weight loss but states she has been exercising regularly and trying to eat very healthy  Past Medical History-  Patient Active Problem List   Diagnosis Date Noted  . Hyperlipidemia 02/02/2007    Priority: Medium  . Essential hypertension 02/02/2007    Priority: Medium  . Osteoporosis 02/02/2007    Priority: Medium  . Morbid obesity (HCC) 03/17/2016    Priority: Low  . Anxiety state 09/13/2014    Priority: Low  . Arthritis of sacroiliac joint (HCC) 05/01/2009    Priority: Low   Medications- reviewed and updated Current Outpatient Prescriptions  Medication Sig Dispense Refill  . ALPRAZolam (XANAX) 0.25 MG tablet TAKE 1 TABLET EVERY 6 HOURS AS NEEDED 20 tablet 0  . amLODipine-olmesartan (AZOR) 5-40 MG tablet TAKE 1 TABLET BY MOUTH DAILY. 30 tablet 5  . aspirin 81 MG tablet Take 81 mg by mouth daily.    . bisoprolol-hydrochlorothiazide (ZIAC) 5-6.25 MG tablet TAKE 1 TABLET BY MOUTH EVERY DAY 90 tablet 1  . calcium-vitamin D (OSCAL WITH D 500-200) 500-200 MG-UNIT per tablet Take 1 tablet by mouth daily.      . fish oil-omega-3 fatty acids 1000 MG capsule Take 2 g by mouth daily.    . fluticasone (FLONASE) 50 MCG/ACT nasal spray Place 2 sprays into both nostrils daily. 16 g 3  . Multiple Vitamin (MULTIVITAMIN) tablet Take 1 tablet by mouth daily.      . Vitamin D, Ergocalciferol, (DRISDOL) 50000 units CAPS capsule TAKE 1 CAPSULE EVERY OTHER WEEK 52 capsule 0   Objective: BP 140/78 (BP Location: Left Arm, Patient Position: Sitting, Cuff Size: Large)   Pulse 78   Temp 98.5 F (36.9 C) (Oral)   Ht 5\' 2"  (1.575 m)   Wt 220 lb 12.8 oz (100.2 kg)   SpO2 95%   BMI 40.38 kg/m  Gen: NAD, resting comfortably CV: RRR no murmurs rubs or gallops Lungs: CTAB no crackles, wheeze,  rhonchi Abdomen: soft/nontender/nondistended/normal bowel sounds. No rebound or guarding.  Ext: 1+ edema Skin: warm, dry  Dg Chest 2 View  Result Date: 03/03/2017 CLINICAL DATA:  Dry cough . EXAM: CHEST  2 VIEW COMPARISON:  No prior . FINDINGS: Mediastinum and hilar structures normal. Cardiomegaly with normal pulmonary vascularity. Right lower lobe atelectasis and consolidation with right-sided pleural effusion. Small left pleural effusion. Degenerative changes thoracic spine. IMPRESSION: 1. Right lower lobe atelectasis and consolidation with moderate right-sided pleural effusion. Small left pleural effusion. 2. Cardiomegaly . Electronically Signed   By: Maisie Fus  Register   On: 03/03/2017 14:15    Assessment/Plan:  Pleural effusion on right - Plan: DG Chest 2 View S: chronic cough for at least 4 months. States slightly better in last week with deep breathing exercises at [facility] center. She denies chest pain or shortness of breath with this.  She had an x-ray 3 days ago which showed a moderate right sided pleural effusion. There was also right lower lobe consolidation noted. Small left pleural effusion. Cardiomegaly noted-reported normal pulmonary vascularity. A/P: We discussed several options today for treatment and follow-up. We ultimately decided on treating with Augmentin and azithromycin for potential pneumonia with associated effusion. Repeat chest x-ray in 3-4 weeks. If she has not shown substantial improvement, we discussed pulmonary referral for consideration of thoracentesis or advanced imaging first.  Other options included CT scan at this time, lateral decubitus film, immediate pulmonary referral, interventional radiology referral for thoracentesis. Once again she stated her preference was antibiotic treatment first in case this was infectious. She should follow-up with [place] sooner for new or worsening symptoms  I spoke with her about the possibility of something like malignancy. She has  been losing weight but she is adamant that she is trying very hard on this with healthy eating and regular exercise. Does have cardiomegaly but doubt CHF as cause of effusion with continued weight loss. Her blood pressure is slightly better though hopeful systolic will get under 140.  She will be transferring care to Dr. Swaziland in November but wants to continue to work with me until that time to help work through the current issue.  Orders Placed This Encounter  Procedures  . DG Chest 2 View    Standing Status:   Future    Standing Expiration Date:   05/06/2018    Order Specific Question:   Reason for Exam (SYMPTOM  OR DIAGNOSIS REQUIRED)    Answer:   follow up pleural effusion and consolidation. planning pulmonary consultation if not cleared- treated with augmentin and azithromycin    Order Specific Question:   Preferred imaging location?    Answer:   [facility]    Meds ordered this encounter  Medications  . amoxicillin-clavulanate (AUGMENTIN) 875-125 MG tablet    Sig: Take 1 tablet by mouth 2 (two) times daily.    Dispense:  14 tablet    Refill:  0  . azithromycin (ZITHROMAX) 250 MG tablet    Sig: Take 2 tabs on day 1, then 1 tab daily until finished    Dispense:  6 tablet    Refill:  0    Return precautions advised.  [name], MD

## 2017-03-06 NOTE — Patient Instructions (Signed)
Take both antibiotics  In about 3 or 4 weeks please go get an x-ray  Please go to [facility] - located [address] across the [address] from [place] - in the basement - Hours: 8:30-5:30 PM M-F. Do not need appointment.

## 2017-03-19 ENCOUNTER — Ambulatory Visit: Payer: Medicare Other | Admitting: Family Medicine

## 2017-03-20 ENCOUNTER — Telehealth: Payer: Self-pay | Admitting: Family Medicine

## 2017-03-20 NOTE — Telephone Encounter (Signed)
Please let patient know that we will make a recommendation after the x-ray

## 2017-03-20 NOTE — Telephone Encounter (Signed)
FYI- Patient is having second chest xray next Friday, Sept 7th.  Wants to make sure that Dr. Durene CalHunter knows she is waiting on his recommendation on whether or not she should go to Pulmonary.  Patient states she wants to do whatever Dr. Durene CalHunter sees best.

## 2017-03-24 NOTE — Telephone Encounter (Signed)
Spoke with patient who verbalized understanding. She states she is feeling better.

## 2017-03-27 ENCOUNTER — Ambulatory Visit (INDEPENDENT_AMBULATORY_CARE_PROVIDER_SITE_OTHER)
Admission: RE | Admit: 2017-03-27 | Discharge: 2017-03-27 | Disposition: A | Discharge status: Home / Self Care | Payer: Medicare Other | Source: Ambulatory Visit | Attending: Family Medicine | Admitting: Family Medicine

## 2017-03-27 DIAGNOSIS — J90 Pleural effusion, not elsewhere classified: Secondary | ICD-10-CM | POA: Diagnosis not present

## 2017-04-01 ENCOUNTER — Other Ambulatory Visit: Payer: Self-pay

## 2017-04-01 DIAGNOSIS — J918 Pleural effusion in other conditions classified elsewhere: Principal | ICD-10-CM

## 2017-04-01 DIAGNOSIS — J189 Pneumonia, unspecified organism: Secondary | ICD-10-CM

## 2017-04-05 ENCOUNTER — Other Ambulatory Visit: Payer: Self-pay | Admitting: Family Medicine

## 2017-04-07 ENCOUNTER — Encounter: Payer: Self-pay | Admitting: Internal Medicine

## 2017-04-07 ENCOUNTER — Other Ambulatory Visit (INDEPENDENT_AMBULATORY_CARE_PROVIDER_SITE_OTHER): Payer: Medicare Other

## 2017-04-07 ENCOUNTER — Ambulatory Visit (INDEPENDENT_AMBULATORY_CARE_PROVIDER_SITE_OTHER): Payer: Medicare Other | Admitting: Internal Medicine

## 2017-04-07 VITALS — BP 170/90 | HR 72 | Ht 62.0 in | Wt 223.4 lb

## 2017-04-07 DIAGNOSIS — R0609 Other forms of dyspnea: Secondary | ICD-10-CM

## 2017-04-07 DIAGNOSIS — R05 Cough: Secondary | ICD-10-CM

## 2017-04-07 DIAGNOSIS — J90 Pleural effusion, not elsewhere classified: Secondary | ICD-10-CM

## 2017-04-07 DIAGNOSIS — R058 Other specified cough: Secondary | ICD-10-CM

## 2017-04-07 LAB — CBC WITH DIFFERENTIAL/PLATELET
BASOS PCT: 0.8 % (ref 0.0–3.0)
Basophils Absolute: 0.1 10*3/uL (ref 0.0–0.1)
EOS ABS: 0.1 10*3/uL (ref 0.0–0.7)
EOS PCT: 1.5 % (ref 0.0–5.0)
HCT: 43.5 % (ref 36.0–46.0)
HEMOGLOBIN: 14 g/dL (ref 12.0–15.0)
LYMPHS ABS: 1.5 10*3/uL (ref 0.7–4.0)
Lymphocytes Relative: 15.1 % (ref 12.0–46.0)
MCHC: 32.2 g/dL (ref 30.0–36.0)
MCV: 82.4 fl (ref 78.0–100.0)
MONO ABS: 1.2 10*3/uL — AB (ref 0.1–1.0)
Monocytes Relative: 11.4 % (ref 3.0–12.0)
NEUTROS PCT: 71.2 % (ref 43.0–77.0)
Neutro Abs: 7.2 10*3/uL (ref 1.4–7.7)
Platelets: 282 10*3/uL (ref 150.0–400.0)
RBC: 5.28 Mil/uL — ABNORMAL HIGH (ref 3.87–5.11)
RDW: 17.1 % — AB (ref 11.5–15.5)
WBC: 10.1 10*3/uL (ref 4.0–10.5)

## 2017-04-07 LAB — BRAIN NATRIURETIC PEPTIDE: PRO B NATRI PEPTIDE: 511 pg/mL — AB (ref 0.0–100.0)

## 2017-04-07 LAB — SEDIMENTATION RATE: SED RATE: 56 mm/h — AB (ref 0–30)

## 2017-04-07 MED ORDER — FUROSEMIDE 20 MG PO TABS: ORAL_TABLET | ORAL | 11 refills | Status: DC

## 2017-04-07 NOTE — Patient Instructions (Addendum)
Lasix 20 mg  Take 2 daily until swelling is better then one daily  As a maintenance  Please see patient coordinator before you leave today  to schedule 2 d echo   Please schedule a follow up office visit in 2 weeks, sooner if needed with CXR on return

## 2017-04-07 NOTE — Progress Notes (Signed)
Subjective:     Patient ID: Jenny [name], female   DOB: 01/19/1934,    MRN: [id number]  HPI  70 yowf never smoker with tendency to seasonal stuffy/cough  spring > fall rx with otc x around 2008 but more persistent since May 2018 > dry cough assoc with sob/ leg swelling eval with cxr 03/03/17 with bilateral R> L effusion and no change on 03/27/17 p zpak so referred to pulmonary clinic 04/07/2017 by Dr   [name]   04/07/2017 1st [facility] office visit/ [facility]   Chief Complaint  Patient presents with  . Pulmonary Consult    Referred by Dr. [name]. Pt states she tends to cough in the spring and the fall.  She states cough is non prod. Cough can be triggered by talking for a while. She also c/o SOB, and is unsure exactly when this started. She gets SOB with grocery shopping and bringing in the groceries. She has had some swelling in her feet and legs for the past 4 months.   Cough is dry with  onset indolent May 2018 persistent daily / is worse in evenings after supper esp with  talking on phone but does fine flat hs and no noct cough  Chronic leg swelling L >R worse since may 2018  Doe= MMRC2 = can't walk a nl pace on a flat grade s sob but does fine slow and flat    No obvious day to day or daytime variability or assoc excess/ purulent sputum or mucus plugs or hemoptysis or cp or chest tightness, subjective wheeze or overt sinus or hb symptoms. No unusual exp hx or h/o childhood pna/ asthma or knowledge of premature birth.  Sleeping ok flat without nocturnal  or early am exacerbation  of respiratory  c/o's or need for noct saba. Also denies any obvious fluctuation of symptoms with weather or environmental changes or other aggravating or alleviating factors except as outlined above   Current Allergies, Complete Past Medical History, Past Surgical History, Family History, and Social History were reviewed in [employer] record.  ROS  The following are not active  complaints unless bolded sore throat, dysphagia, dental problems, itching, sneezing,  nasal congestion or disharge of excess mucus or purulent secretions, ear ache,   fever, chills, sweats, unintended wt loss or wt gain, classically pleuritic or exertional cp,  orthopnea pnd or leg swelling, presyncope, palpitations, abdominal pain, anorexia, nausea, vomiting, diarrhea  or change in bowel habits or bladder habits, change in stools or change in urine, dysuria, hematuria,  rash, arthralgias, visual complaints, headache, numbness, weakness or ataxia or problems with walking or coordination,  change in mood/affect or memory.        Current Meds  Medication Sig  . ALPRAZolam (XANAX) 0.25 MG tablet TAKE 1 TABLET EVERY 6 HOURS AS NEEDED  . amLODipine-olmesartan (AZOR) 5-40 MG tablet TAKE 1 TABLET BY MOUTH DAILY.  Marland Kitchen aspirin 81 MG tablet Take 81 mg by mouth daily.  . bisoprolol-hydrochlorothiazide (ZIAC) 5-6.25 MG tablet TAKE 1 TABLET BY MOUTH EVERY DAY  . calcium-vitamin D (OSCAL WITH D 500-200) 500-200 MG-UNIT per tablet Take 1 tablet by mouth daily.    . fish oil-omega-3 fatty acids 1000 MG capsule Take 2 g by mouth daily.  . fluticasone (FLONASE) 50 MCG/ACT nasal spray Place 2 sprays into both nostrils daily. (Patient taking differently: Place 2 sprays into both nostrils daily as needed. )  . Multiple Vitamin (MULTIVITAMIN) tablet Take 1 tablet by mouth  daily.    . Vitamin D, Ergocalciferol, (DRISDOL) 50000 units CAPS capsule TAKE 1 CAPSULE EVERY OTHER WEEK           Review of Systems     Objective:   Physical Exam    very pleasant amb obese wf nad     Wt Readings from Last 3 Encounters:  04/07/17 223 lb 6.4 oz (101.3 kg)  03/06/17 220 lb 12.8 oz (100.2 kg)  02/24/17 225 lb 6.4 oz (102.2 kg)    Vital signs reviewed  - Note on arrival 02 sats  96% on RA     HEENT: nl dentition, turbinates bilaterally, and oropharynx. Nl external ear canals without cough reflex   NECK :  without  JVD/Nodes/TM/ nl carotid upstrokes bilaterally   LUNGS: no acc muscle use,  Nl contour chest which is clear to A and P bilaterally without cough on insp or exp maneuvers   CV:  RRR  no s3 or murmur or increase in P2, and 1-2+  bilateral lower ext L > R pitting  edema   ABD:  soft and nontender with nl inspiratory excursion in the supine position. No bruits or organomegaly appreciated, bowel sounds nl  MS:  Nl gait/ ext warm without deformities, calf tenderness, cyanosis or clubbing No obvious joint restrictions   SKIN: warm and dry  With venous stasis dermatitis both lower ext  NEURO:  alert, approp, nl sensorium with  no motor or cerebellar deficits apparent.     I personally reviewed images and agree with radiology impression as follows:  CXR:   03/27/17  Unchanged appearance of right-sided opacity at the base of the lung, likely a combination of pleural effusion and atelectasis/ consolidation. Cardiomegaly   Labs ordered/ reviewed:      Chemistry      Component Value Date/Time   NA 138 02/24/2017 1437   K 4.2 02/24/2017 1437   CL 99 02/24/2017 1437   CO2 31 02/24/2017 1437   BUN 13 02/24/2017 1437   CREATININE 0.78 02/24/2017 1437      Component Value Date/Time   CALCIUM 9.6 02/24/2017 1437   ALKPHOS 59 02/24/2017 1437   AST 22 02/24/2017 1437   ALT 13 02/24/2017 1437   BILITOT 1.7 (H) 02/24/2017 1437        Lab Results  Component Value Date   WBC 10.1 04/07/2017   HGB 14.0 04/07/2017   HCT 43.5 04/07/2017   MCV 82.4 04/07/2017   PLT 282.0 04/07/2017     Lab Results  Component Value Date   DDIMER 1.94 (H) 04/07/2017      Lab Results  Component Value Date   TSH 1.86 01/14/2017     Lab Results  Component Value Date   PROBNP 511.0 (H) 04/07/2017       Lab Results  Component Value Date   ESRSEDRATE 56 (H) 04/07/2017       Labs ordered 04/07/2017  Allergy profile      Assessment:

## 2017-04-08 DIAGNOSIS — R05 Cough: Secondary | ICD-10-CM | POA: Insufficient documentation

## 2017-04-08 DIAGNOSIS — R058 Other specified cough: Secondary | ICD-10-CM | POA: Insufficient documentation

## 2017-04-08 LAB — RESPIRATORY ALLERGY PROFILE REGION II ~~LOC~~
Allergen, A. alternata, m6: <0.1 kU/L
Allergen, Cedar tree, t12: <0.1 kU/L
Allergen, Comm Silver Birch, t9: <0.1 kU/L
Allergen, D pternoyssinus,d7: <0.1 kU/L
Allergen, P. notatum, m1: <0.1 kU/L
Box Elder IgE: <0.1 kU/L
CLADOSPORIUM HERBARUM (M2) IGE: <0.1 kU/L
CLASS: 0
CLASS: 0
CLASS: 0
CLASS: 0
CLASS: 0
CLASS: 0
CLASS: 0
CLASS: 0
CLASS: 0
CLASS: 0
CLASS: 0
CLASS: 0
CLASS: 0
CLASS: 0
CLASS: 0
CLASS: 0
COMMON RAGWEED (SHORT) (W1) IGE: <0.1 kU/L
Class: 0
Class: 0
Class: 0
Class: 0
Class: 0
Class: 0
Class: 0
Class: 0
D. farinae: <0.1 kU/L
Dog Dander: <0.1 kU/L
Elm IgE: <0.1 kU/L
IgE (Immunoglobulin E), Serum: 105 kU/L (ref <=114)
Johnson Grass: <0.1 kU/L
Pecan/Hickory Tree IgE: <0.1 kU/L
Timothy Grass: <0.1 kU/L

## 2017-04-08 LAB — INTERPRETATION:

## 2017-04-08 LAB — D-DIMER, QUANTITATIVE (NOT AT ARMC): D DIMER QUANT: 1.94 ug{FEU}/mL — AB (ref <0.50)

## 2017-04-08 NOTE — Assessment & Plan Note (Signed)
Allergy profile 04/07/2017 >  Eos 0.1 /  IgE    Upper airway cough syndrome (previously labeled PNDS) , is  so named because it's frequently impossible to sort out how much is  CR/sinusitis with freq throat clearing (which can be related to primary GERD)   vs  causing  secondary (" extra esophageal")  GERD from wide swings in gastric pressure that occur with throat clearing, often  promoting self use of mint and menthol lozenges that reduce the lower esophageal sphincter tone and exacerbate the problem further in a cyclical fashion.   These are the same pts (now being labeled as having "irritable larynx syndrome" by some cough centers) who not infrequently have a history of having failed to tolerate ace inhibitors,  dry powder inhalers or biphosphonates or report having atypical/extraesophageal reflux symptoms that don't respond to standard doses of PPI  and are easily confused as having aecopd or asthma flares by even experienced allergists/ pulmonologists (myself included).   She probably has seasonal rhinitis / pnds independent of the effusion already on flonase  - note  Intranasal steroids and intranasal antihistamines are effective for symptoms associated with non-allergic rhinitis, whereas second generation antihistamines such as cetirizine, loratadine and fexofenadine have been found to be ineffective for this condition, so need to sort this out and consider dymista or 1st gen H1 blockers per guidelines  For next flare = chlorpheniramine

## 2017-04-08 NOTE — Assessment & Plan Note (Signed)
C/w effects of effusion which also has caused some "relaxation atx " in R base but hard to exclude PE in this setting so rec CTa next stedp

## 2017-04-08 NOTE — Assessment & Plan Note (Addendum)
Onset of symptoms May 2018  - CTa  >>>  - 2 d Echo   >>>   Her bnp is not as high relative to ESR  as I suspected it would be and her d dimer is well above the nl range even factoring in age and chronic illness so we are obligated I think to proceed with CTa this week to exclude underlying ca/parapneumonic effusion/  pe but I think this picture fits best with chronic diastolic dysfunction / vol overload/ hydrostatic process though the ddx for pleural effusion is over 100 entities so can't be sure at this point it's just a fluid issue:  rec  Lasix 20 mg x 2 until swelling better then 1 daily and return in 2 weeks for bmet/ cxr and echo in interim CTa now  Discussed in detail all the  indications, usual  risks and alternatives  relative to the benefits with patient who agrees to proceed with w/u and rx as outlined    Total time devoted to counseling  > 50 % of initial 60 min office visit:  review case with pt/ discussion of options/alternatives/ personally creating written customized instructions  in presence of pt  then going over those specific  Instructions directly with the pt including how to use all of the meds but in particular covering each new medication in detail and the difference between the maintenance= "automatic" meds and the prns using an action plan format for the latter (If this problem/symptom => do that organization reading Left to right).  Please see AVS from this visit for a full list of these instructions which I personally wrote for this pt and  are unique to this visit.

## 2017-04-09 ENCOUNTER — Telehealth: Payer: Self-pay | Admitting: Internal Medicine

## 2017-04-09 NOTE — Progress Notes (Signed)
LMTCB

## 2017-04-09 NOTE — Telephone Encounter (Signed)
Notes recorded by Nyoka Cowden, MD on 04/08/2017 at 7:59 PM EDT Call patient : Studies are unremarkable, no change in recs  Component 2d ago       Called pt and advised pt results. She verbalized understanding. Nothing further is needed.

## 2017-04-14 ENCOUNTER — Ambulatory Visit (HOSPITAL_COMMUNITY): Payer: Medicare Other | Attending: Cardiology

## 2017-04-14 ENCOUNTER — Other Ambulatory Visit: Payer: Self-pay

## 2017-04-14 DIAGNOSIS — E785 Hyperlipidemia, unspecified: Secondary | ICD-10-CM | POA: Insufficient documentation

## 2017-04-14 DIAGNOSIS — R0609 Other forms of dyspnea: Secondary | ICD-10-CM | POA: Diagnosis not present

## 2017-04-14 DIAGNOSIS — I10 Essential (primary) hypertension: Secondary | ICD-10-CM | POA: Diagnosis not present

## 2017-04-14 DIAGNOSIS — R600 Localized edema: Secondary | ICD-10-CM | POA: Insufficient documentation

## 2017-04-14 DIAGNOSIS — I361 Nonrheumatic tricuspid (valve) insufficiency: Secondary | ICD-10-CM | POA: Diagnosis not present

## 2017-04-14 NOTE — Progress Notes (Signed)
Spoke with pt and notified of results per Dr. Wert. Pt verbalized understanding and denied any questions. 

## 2017-04-15 ENCOUNTER — Other Ambulatory Visit: Payer: Self-pay | Admitting: Internal Medicine

## 2017-04-15 ENCOUNTER — Other Ambulatory Visit (INDEPENDENT_AMBULATORY_CARE_PROVIDER_SITE_OTHER): Payer: Medicare Other

## 2017-04-15 DIAGNOSIS — R0609 Other forms of dyspnea: Principal | ICD-10-CM

## 2017-04-15 DIAGNOSIS — J90 Pleural effusion, not elsewhere classified: Secondary | ICD-10-CM

## 2017-04-15 LAB — BASIC METABOLIC PANEL
BUN: 13 mg/dL (ref 6–23)
CALCIUM: 10.1 mg/dL (ref 8.4–10.5)
CO2: 31 meq/L (ref 19–32)
Chloride: 97 mEq/L (ref 96–112)
Creatinine, Ser: 0.79 mg/dL (ref 0.40–1.20)
GFR: 74.16 mL/min (ref >60.00)
GLUCOSE: 119 mg/dL — AB (ref 70–99)
Potassium: 3.4 mEq/L — ABNORMAL LOW (ref 3.5–5.1)
Sodium: 138 mEq/L (ref 135–145)

## 2017-04-15 NOTE — Progress Notes (Signed)
Spoke with pt and notified of results per Dr. Wert. Pt verbalized understanding and denied any questions. 

## 2017-04-17 ENCOUNTER — Inpatient Hospital Stay: Admission: RE | Admit: 2017-04-17 | Payer: Medicare Other | Source: Ambulatory Visit

## 2017-04-18 DIAGNOSIS — Z23 Encounter for immunization: Secondary | ICD-10-CM | POA: Diagnosis not present

## 2017-04-21 ENCOUNTER — Ambulatory Visit (INDEPENDENT_AMBULATORY_CARE_PROVIDER_SITE_OTHER)
Admission: RE | Admit: 2017-04-21 | Discharge: 2017-04-21 | Disposition: A | Discharge status: Home / Self Care | Payer: Medicare Other | Source: Ambulatory Visit | Attending: Internal Medicine | Admitting: Internal Medicine

## 2017-04-21 DIAGNOSIS — R0602 Shortness of breath: Secondary | ICD-10-CM | POA: Diagnosis not present

## 2017-04-21 DIAGNOSIS — J90 Pleural effusion, not elsewhere classified: Secondary | ICD-10-CM

## 2017-04-21 DIAGNOSIS — R0609 Other forms of dyspnea: Secondary | ICD-10-CM | POA: Diagnosis not present

## 2017-04-21 DIAGNOSIS — I700 Atherosclerosis of aorta: Secondary | ICD-10-CM | POA: Diagnosis not present

## 2017-04-21 MED ORDER — IOPAMIDOL (ISOVUE-370) INJECTION 76%
80.0000 mL | Freq: Once | INTRAVENOUS | Status: AC | PRN
  Administered 2017-04-21: 80 mL via INTRAVENOUS

## 2017-04-22 ENCOUNTER — Other Ambulatory Visit (INDEPENDENT_AMBULATORY_CARE_PROVIDER_SITE_OTHER): Payer: Medicare Other

## 2017-04-22 ENCOUNTER — Ambulatory Visit (INDEPENDENT_AMBULATORY_CARE_PROVIDER_SITE_OTHER): Payer: Medicare Other | Admitting: Internal Medicine

## 2017-04-22 ENCOUNTER — Encounter: Payer: Self-pay | Admitting: Internal Medicine

## 2017-04-22 VITALS — BP 122/76 | HR 76 | Ht 62.0 in | Wt 202.0 lb

## 2017-04-22 DIAGNOSIS — R918 Other nonspecific abnormal finding of lung field: Secondary | ICD-10-CM | POA: Diagnosis not present

## 2017-04-22 DIAGNOSIS — J90 Pleural effusion, not elsewhere classified: Secondary | ICD-10-CM | POA: Diagnosis not present

## 2017-04-22 DIAGNOSIS — R0609 Other forms of dyspnea: Secondary | ICD-10-CM

## 2017-04-22 LAB — BASIC METABOLIC PANEL
BUN: 17 mg/dL (ref 6–23)
CALCIUM: 10 mg/dL (ref 8.4–10.5)
CHLORIDE: 100 meq/L (ref 96–112)
CO2: 27 meq/L (ref 19–32)
CREATININE: 0.85 mg/dL (ref 0.40–1.20)
GFR: 68.15 mL/min (ref >60.00)
GLUCOSE: 103 mg/dL — AB (ref 70–99)
Potassium: 3.9 mEq/L (ref 3.5–5.1)
Sodium: 137 mEq/L (ref 135–145)

## 2017-04-22 NOTE — Progress Notes (Signed)
Subjective:     Patient ID: Jenny [name], female   DOB: [birth year],    MRN: [id number]    Brief patient profile:  10 yowf never smoker with tendency to seasonal stuffy/cough  spring > fall rx with otc x around 2008 but more persistent since May 2018 > dry cough assoc with sob/ leg swelling eval with cxr 03/03/17 with bilateral R> L effusion and no change on 03/27/17 p zpak so referred to pulmonary clinic 04/07/2017 by Dr   [name]   History of Present Illness  04/07/2017 1st [facility] office visit/ Stevan Eberwein   Chief Complaint  Patient presents with  . Pulmonary Consult    Referred by Dr. [name]. Pt states she tends to cough in the spring and the fall.  She states cough is non prod. Cough can be triggered by talking for a while. She also c/o SOB, and is unsure exactly when this started. She gets SOB with grocery shopping and bringing in the groceries. She has had some swelling in her feet and legs for the past 4 months.   Cough is dry with  onset indolent May 2018 persistent daily / is worse in evenings after supper esp with  talking on phone but does fine flat hs and no noct cough  Chronic leg swelling L >R worse since may 2018  Doe= MMRC2 = can't walk a nl pace on a flat grade s sob but does fine slow and flat  rec Lasix 20 mg  Take 2 daily until swelling is better then one daily  As a maintenance Please see patient coordinator before you leave today  to schedule 2 d echo      04/22/2017  f/u ov/[facility] re:  Chf/ improved p diuresis  Chief Complaint  Patient presents with  . Follow-up    very little SOB    Doe now = MMRC1 = can walk nl pace, flat grade, can't hurry or go uphills or steps s sob    No obvious day to day or daytime variability or assoc excess/ purulent sputum or mucus plugs or hemoptysis or cp or chest tightness, subjective wheeze or overt sinus or hb symptoms. No unusual exp hx or h/o childhood pna/ asthma or knowledge of premature birth.  Sleeping ok flat without  nocturnal  or early am exacerbation  of respiratory  c/o's or need for noct saba. Also denies any obvious fluctuation of symptoms with weather or environmental changes or other aggravating or alleviating factors except as outlined above   Current Allergies, Complete Past Medical History, Past Surgical History, Family History, and Social History were reviewed in [employer] record.  ROS  The following are not active complaints unless bolded Hoarseness, sore throat, dysphagia, dental problems, itching, sneezing,  nasal congestion or discharge of excess mucus or purulent secretions, ear ache,   fever, chills, sweats, unintended wt loss or wt gain, classically pleuritic or exertional cp,  orthopnea pnd or leg swelling decreased , presyncope, palpitations, abdominal pain, anorexia, nausea, vomiting, diarrhea  or change in bowel habits or change in bladder habits, change in stools or change in urine, dysuria, hematuria,  rash, arthralgias, visual complaints, headache, numbness, weakness or ataxia or problems with walking or coordination,  change in mood/affect or memory.        Current Meds  Medication Sig  . ALPRAZolam (XANAX) 0.25 MG tablet TAKE 1 TABLET EVERY 6 HOURS AS NEEDED  . amLODipine-olmesartan (AZOR) 5-40 MG tablet TAKE 1 TABLET BY  MOUTH DAILY.  Marland Kitchen aspirin 81 MG tablet Take 81 mg by mouth daily.  . bisoprolol-hydrochlorothiazide (ZIAC) 5-6.25 MG tablet TAKE 1 TABLET BY MOUTH EVERY DAY  . calcium-vitamin D (OSCAL WITH D 500-200) 500-200 MG-UNIT per tablet Take 1 tablet by mouth daily.    . fish oil-omega-3 fatty acids 1000 MG capsule Take 2 g by mouth daily.  . fluticasone (FLONASE) 50 MCG/ACT nasal spray Place 2 sprays into both nostrils daily. (Patient taking differently: Place 2 sprays into both nostrils daily as needed. )  . furosemide (LASIX) 20 MG tablet 2 each am until leg swelling then one daily  . Multiple Vitamin (MULTIVITAMIN) tablet Take 1 tablet by mouth  daily.    . Vitamin D, Ergocalciferol, (DRISDOL) 50000 units CAPS capsule TAKE 1 CAPSULE EVERY OTHER WEEK                     Objective:   Physical Exam    very pleasant amb obese wf nad  - all smiles!       04/22/2017       202   04/07/17 223 lb 6.4 oz (101.3 kg)  03/06/17 220 lb 12.8 oz (100.2 kg)  02/24/17 225 lb 6.4 oz (102.2 kg)    Vital signs reviewed  - Note on arrival 02 sats  96% on RA     HEENT: nl dentition, turbinates bilaterally, and oropharynx. Nl external ear canals without cough reflex   NECK :  without JVD/Nodes/TM/ nl carotid upstrokes bilaterally   LUNGS: no acc muscle use,  Nl contour chest which is clear to A and P bilaterally without cough on insp or exp maneuvers   CV:  RRR  no s3 or murmur or increase in P2, and  trace  bilateral lower ext L > R pitting  edema   ABD:  soft and nontender with nl inspiratory excursion in the supine position. No bruits or organomegaly appreciated, bowel sounds nl  MS:  Nl gait/ ext warm without deformities, calf tenderness, cyanosis or clubbing No obvious joint restrictions   SKIN: warm and dry  With venous stasis dermatitis both lower ext  NEURO:  alert, approp, nl sensorium with  no motor or cerebellar deficits apparent.      I personally reviewed images and agree with radiology impression as follows:   Chest CTa  04/21/17 Cardiomegaly, trace right-sided pleural effusion and reflux of contrast into the hepatic veins, nonspecific though could be indicative of early right-sided heart failure. Clinical correlation is advised. Borderline enlarged caliber the main pulmonary artery, nonspecific though could be seen in the setting of pulmonary arterial hypertension. Further evaluation cardiac echo could be performed as clinically indicated. 4. Aortic Atherosclerosis (ICD10-I70.0). 5. Solitary borderline enlarged mediastinal lymph node which while nonspecific, in the absence of known malignancy, is favored  to be reactive etiology in the setting of a right-sided pleural effusion. 6. Punctate pulmonary nodules, largest of which within the right lower lobe measures 5 mm in diameter. No follow-up needed if patient is low-risk            Chemistry      Component Value Date/Time   NA 137 04/22/2017 1356   K 3.9 04/22/2017 1356   CL 100 04/22/2017 1356   CO2 27 04/22/2017 1356   BUN 17 04/22/2017 1356   CREATININE 0.85 04/22/2017 1356      Component Value Date/Time   CALCIUM 10.0 04/22/2017 1356   ALKPHOS 59 02/24/2017 1437   AST  22 02/24/2017 1437   ALT 13 02/24/2017 1437   BILITOT 1.7 (H) 02/24/2017 1437        Assessment:

## 2017-04-22 NOTE — Patient Instructions (Addendum)
Please remember to go to the  Lab  department downstairs in the basement  for your tests - we will call you with the results when they are available.       If you are satisfied with your treatment plan,  let your doctor know and he/she can either refill your medications or you can return here when your prescription runs out.     If in any way you are not 100% satisfied,  please tell us.  If 100% better, tell your friends!  Pulmonary follow up is as needed

## 2017-04-23 NOTE — Progress Notes (Signed)
Spoke with pt and notified of results per Dr. Wert. Pt verbalized understanding and denied any questions. 

## 2017-04-23 NOTE — Assessment & Plan Note (Signed)
Ct Chest 04/21/17  Solitary borderline enlarged mediastinal lymph node which while nonspecific, in the absence of known malignancy, is favored to be reactive etiology in the setting of a right-sided pleural effusion. 6. Punctate pulmonary nodules, largest of which within the right lower lobe measures 5 mm in diameter. No follow-up needed if patient is low-risk     CT results reviewed with pt >>> Too small for PET or bx, not suspicious enough for excisional bx > really only option for now is follow the Fleischner society guidelines as rec by radiology> she is never smoker,  very low risk  Discussed in detail all the  indications, usual  risks and alternatives  relative to the benefits with patient who agrees to proceed with conservative f/u as outlined = no dedicated f/u needed

## 2017-04-23 NOTE — Assessment & Plan Note (Signed)
Body mass index is 36.95 kg/m.  -  trending down with diuresis  Lab Results  Component Value Date   TSH 1.86 01/14/2017     Contributing to gerd risk/ doe/reviewed the need and the process to achieve and maintain neg calorie balance > defer f/u primary care including intermittently monitoring thyroid status

## 2017-04-23 NOTE — Assessment & Plan Note (Signed)
Resolved p diuresis/ no pulmonary f/u needed

## 2017-04-23 NOTE — Assessment & Plan Note (Signed)
Onset of symptoms May 2018   - 2 d Echo 04/14/2017 >>> - Left ventricle: The cavity size was normal. Systolic function was normal. The estimated ejection fraction was in the range of 55% to 60%. Wall motion was normal; there were no regional wall motion abnormalities. There was no evidence of elevated ventricular filling pressure by Doppler parameters. - Ventricular septum: The contour showed diastolic flattening. - Aortic valve: There was trivial regurgitation. - Left atrium: The atrium was moderately dilated. - Right ventricle: The cavity size was moderately dilated. Wall thickness was normal. - Right atrium: The atrium was moderately dilated. - Tricuspid valve: There was severe regurgitation. - Pulmonary arteries: Systolic pressure was mildly increased. PA peak pressure: 31 mm Hg (S).  Chest CTa  04/21/17 Cardiomegaly, trace right-sided pleural effusion and reflux of contrast into the hepatic veins, nonspecific though could be indicative of early right-sided heart failure. Clinical correlation is advised. Borderline enlarged caliber the main pulmonary artery, nonspecific though could be seen in the setting of pulmonary arterial hypertension. Further evaluation cardiac echo could be performed as clinically indicated. - 04/22/2017 improved on lasix >  Follow up per Primary Care planned   She has lost 20 lb on relatively low dose lasix and feels great now despite persistent mild changes on CT.  She has mild PH but likely related to L heart pressures and no need for w/u for PAH at this time She is establishing with new PCP soon and defer to her whether to have cards see in future but for now rx is the same:  Lasix 20 mg each am, take 2 if legs swell/ note K ok s supplement   I had an extended discussion with the patient reviewing all relevant studies completed to date and  lasting 15 to 20 minutes of a 25 minute visit    Each maintenance medication was reviewed in detail  including most importantly the difference between maintenance and prns and under what circumstances the prns are to be triggered using an action plan format that is not reflected in the computer generated alphabetically organized AVS.    Please see AVS for specific instructions unique to this visit that I personally wrote and verbalized to the the pt in detail and then reviewed with pt  by my nurse highlighting any  changes in therapy recommended at today's visit to their plan of care.

## 2017-05-26 ENCOUNTER — Encounter: Payer: Self-pay | Admitting: Family Medicine

## 2017-05-26 ENCOUNTER — Ambulatory Visit: Payer: Medicare Other | Admitting: Family Medicine

## 2017-05-26 ENCOUNTER — Ambulatory Visit (INDEPENDENT_AMBULATORY_CARE_PROVIDER_SITE_OTHER): Payer: Medicare Other | Admitting: Family Medicine

## 2017-05-26 VITALS — BP 120/70 | HR 67 | Temp 98.5°F | Resp 16 | Ht 62.0 in | Wt 210.5 lb

## 2017-05-26 DIAGNOSIS — I872 Venous insufficiency (chronic) (peripheral): Secondary | ICD-10-CM | POA: Diagnosis not present

## 2017-05-26 DIAGNOSIS — J309 Allergic rhinitis, unspecified: Secondary | ICD-10-CM

## 2017-05-26 DIAGNOSIS — F411 Generalized anxiety disorder: Secondary | ICD-10-CM

## 2017-05-26 DIAGNOSIS — I10 Essential (primary) hypertension: Secondary | ICD-10-CM

## 2017-05-26 DIAGNOSIS — I499 Cardiac arrhythmia, unspecified: Secondary | ICD-10-CM

## 2017-05-26 MED ORDER — ALPRAZOLAM 0.25 MG PO TABS: ORAL_TABLET | ORAL | 1 refills | Status: DC

## 2017-05-26 MED ORDER — FLUTICASONE PROPIONATE 50 MCG/ACT NA SUSP: 2.0000 | Freq: Every day | NASAL | 6 refills | Status: DC

## 2017-05-26 MED ORDER — BISOPROLOL-HYDROCHLOROTHIAZIDE 5-6.25 MG PO TABS: 1.0000 | ORAL_TABLET | Freq: Every day | ORAL | 2 refills | Status: AC

## 2017-05-26 MED ORDER — AMLODIPINE-OLMESARTAN 5-40 MG PO TABS: 1.0000 | ORAL_TABLET | Freq: Every day | ORAL | 2 refills | Status: DC

## 2017-05-26 NOTE — Progress Notes (Signed)
HPI:   Jenny [name] is a 83 y.o. female, who is here today to establish care.  Former PCP: Dr [name] Last preventive routine visit: 05/2016  Chronic medical problems: HTN, HLD, allergies, anxiety, venous stasis dermatitis, and vit D deficiency. He is currently following with pulmonologist, Dr. Sherene Sires, due to pleural effusion.She was referred initially because persistent cough.  Chest CTA 04/2017.  1. No evidence of pulmonary embolism. 2. Cardiomegaly, trace right-sided pleural effusion and reflux of contrast into the hepatic veins, nonspecific though could be indicative of early right-sided heart failure. Clinical correlation is advised. 3. Borderline enlarged caliber the main pulmonary artery, nonspecific though could be seen in the setting of pulmonary arterial hypertension. Further evaluation cardiac echo could be performed as clinically indicated. 4. Aortic Atherosclerosis (ICD10-I70.0). 5. Solitary borderline enlarged mediastinal lymph node which while nonspecific, in the absence of known malignancy, is favored to be reactive etiology in the setting of a right-sided pleural effusion. 6. Punctate pulmonary nodules, largest of which within the right lower lobe measures 5 mm in diameter. No follow-up needed if patient is low-risk (and has no known or suspected primary neoplasm). Non-contrast chest CT can be considered in 12 months if patient is high-risk.  7. Moderate to severe rotatory scoliotic curvature of the thoracolumbar spine.   She is reporting resolution of cough after she started using Flonase nasal spray. Exertional dyspnea also resolved after starting Furosemide, currently she is on 20 mg. States that she is fatigue, which she attributes to the amount of chores and daily activities, including taking care of her 27 yo foster sister. She does not live with her.  Lower extremity edema is worse at the end of the day and improve with extremity elevation. Erythema  and occasional pruritus on pretibial areas bilateral, she denies any superficial excoriation or vesicular lesions.  Problem has improved with Furosemide. She denies orthopnea or PND.   Hypertension:   Chronic problem. Currently on Bisoprolol-HCTZ 5-6.25 mg daily and Azor 5-40 mg daily.  She taking medications as instructed, no side effects reported.  She has not noted unusual headache, visual changes, exertional chest pain, dyspnea, or focal weakness.   Lab Results  Component Value Date   CREATININE 0.85 04/22/2017   BUN 17 04/22/2017   NA 137 04/22/2017   K 3.9 04/22/2017   CL 100 04/22/2017   CO2 27 04/22/2017     She has Hx of anxiety and currently she is on Xanax 0.25 mg. She has been on this medication for many years and prn. She denies depressed mood or suicidal thoughts. She is tolerating medication well, denies side effects.   Review of Systems  Constitutional: Positive for fatigue. Negative for activity change, appetite change, fever and unexpected weight change.  HENT: Negative for mouth sores, nosebleeds, sore throat and trouble swallowing.   Eyes: Negative for redness and visual disturbance.  Respiratory: Negative for cough, shortness of breath and wheezing.   Cardiovascular: Positive for leg swelling. Negative for chest pain and palpitations.  Gastrointestinal: Negative for abdominal pain, nausea and vomiting.       Negative for changes in bowel habits.  Endocrine: Negative for cold intolerance and heat intolerance.  Genitourinary: Negative for decreased urine volume, dysuria and hematuria.  Musculoskeletal: Negative for gait problem and myalgias.  Skin: Negative for rash and wound.  Allergic/Immunologic: Positive for environmental allergies.  Neurological: Negative for seizures, syncope, weakness, numbness and headaches.  Psychiatric/Behavioral: Positive for sleep disturbance. Negative for confusion. The  patient is nervous/anxious.       Current  Outpatient Medications on File Prior to Visit  Medication Sig Dispense Refill  . aspirin 81 MG tablet Take 81 mg by mouth daily.    . calcium-vitamin D (OSCAL WITH D 500-200) 500-200 MG-UNIT per tablet Take 1 tablet by mouth daily.      . fish oil-omega-3 fatty acids 1000 MG capsule Take 2 g by mouth daily.    . furosemide (LASIX) 20 MG tablet 2 each am until leg swelling then one daily 60 tablet 11  . Multiple Vitamin (MULTIVITAMIN) tablet Take 1 tablet by mouth daily.      . Vitamin D, Ergocalciferol, (DRISDOL) 50000 units CAPS capsule TAKE 1 CAPSULE EVERY OTHER WEEK 52 capsule 0   No current facility-administered medications on file prior to visit.      Past Medical History:  Diagnosis Date  . Arthritis   . Hyperlipidemia   . Hypertension   . Osteoporosis    No Known Allergies  Family History  Problem Relation Age of Onset  . Prostate cancer Father   . Hypertension Father   . Parkinsonism Brother     Social History   Socioeconomic History  . Marital status: Widowed    Spouse name: None  . Number of children: None  . Years of education: None  . Highest education level: None  Social Needs  . Financial resource strain: None  . Food insecurity - worry: None  . Food insecurity - inability: None  . Transportation needs - medical: None  . Transportation needs - non-medical: None  Occupational History  . None  Tobacco Use  . Smoking status: Never Smoker  . Smokeless tobacco: Never Used  Substance and Sexual Activity  . Alcohol use: No  . Drug use: No  . Sexual activity: Not Currently  Other Topics Concern  . None  Social History Narrative   Widowed 2012. 2 children (lost 3rd son unknown cause at [id number]). 9 grandchildren. 2 greatgrandchildren.    Lives alone. Does everything for herself except yardwork. 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       Hobbies: day trips [facility], time with family, enjoys meal gatherings    Vitals:   05/26/17 1335 05/26/17 1436  BP: 120/70   Pulse: 90 67  Resp: 16   Temp: 98.5 F (36.9 C)   SpO2: 97%     Body mass index is 38.5 kg/m.   Physical Exam  Nursing note and vitals reviewed. Constitutional: She is oriented to person, place, and time. She appears well-developed. No distress.  HENT:  Head: Normocephalic and atraumatic.  Mouth/Throat: Oropharynx is clear and moist and mucous membranes are normal. She has dentures.  Eyes: Conjunctivae are normal. Pupils are equal, round, and reactive to light.  Cardiovascular: Normal rate. An irregular rhythm present.  No murmur heard. Pulses:      Dorsalis pedis pulses are 2+ on the right side, and 2+ on the left side.  Respiratory: Effort normal and breath sounds normal. No respiratory distress.  GI: Soft. She exhibits no mass. There is no hepatomegaly. There is no tenderness.  Musculoskeletal: She exhibits edema (2+ pitting LE edema,bilateral.). She exhibits no tenderness.  Lymphadenopathy:    She has no cervical adenopathy.  Neurological: She is alert and oriented to person, place, and time. She has normal strength.  Otherwise stable  gait with no assistance.  Skin: Skin is warm. No abrasion and no rash noted. Rash is not vesicular. There is erythema (Pretibial area, mild,bilateral.).  Psychiatric: She has a normal mood and affect.  Well groomed, good eye contact.      ASSESSMENT AND PLAN:   Corrie DandyMary was seen today for follow-up.  Diagnoses and all orders for this visit:  Venous stasis dermatitis of both lower extremities  Educated about Dx,prognosis,and treatment options. Compression stockings recommended. Good skin care. LE elevation a few times in the afternoon may also help.  Essential hypertension  Adequately controlled. No changes in current management. DASH-low salt diet to continue. Eye exam recommended annually. F/U in 6 months, before if  needed.  -     amLODipine-olmesartan (AZOR) 5-40 MG tablet; Take 1 tablet daily by mouth. -     bisoprolol-hydrochlorothiazide (ZIAC) 5-6.25 MG tablet; Take 1 tablet daily by mouth.  Irregular heart rate  Noted on examination today. Asymptomatic. EKG 05/2012 with sinus arrhythmia and bradycardia. Finding discussed with Ms Jena GaussMaxwell, instructed about warning sings.   Allergic rhinitis, unspecified seasonality, unspecified trigger  Well controlled with current management,so no changes. Nasal irrigations with saline as needed may also help.  -     fluticasone (FLONASE) 50 MCG/ACT nasal spray; Place 2 sprays daily into both nostrils.  Anxiety state  Stable. Side effects of benzo meds discussed. No changes in current management. F/U in 3-4 months.  -     ALPRAZolam (XANAX) 0.25 MG tablet; TAKE 1 TABLET EVERY 6 HOURS AS NEEDED     Mikayela Deats G. [place], MD  [facility]. [place] office.

## 2017-05-26 NOTE — Patient Instructions (Signed)
A few things to remember from today's visit:   Anxiety state - Plan: ALPRAZolam (XANAX) 0.25 MG tablet  Essential hypertension  Venous stasis dermatitis of both lower extremities   Varicose Veins Varicose veins are veins that have become enlarged and twisted. They are usually seen in the legs but can occur in other parts of the body as well. What are the causes? This condition is the result of valves in the veins not working properly. Valves in the veins help to return blood from the leg to the heart. If these valves are damaged, blood flows backward and backs up into the veins in the leg near the skin. This causes the veins to become larger. What increases the risk? People who are on their feet a lot, who are pregnant, or who are overweight are more likely to develop varicose veins. What are the signs or symptoms?  Bulging, twisted-appearing, bluish veins, most commonly found on the legs.  Leg pain or a feeling of heaviness. These symptoms may be worse at the end of the day.  Leg swelling.  Changes in skin color. How is this diagnosed? A health care provider can usually diagnose varicose veins by examining your legs. Your health care provider may also recommend an ultrasound of your leg veins. How is this treated? Most varicose veins can be treated at home.However, other treatments are available for people who have persistent symptoms or want to improve the cosmetic appearance of the varicose veins. These treatment options include:  Sclerotherapy. A solution is injected into the vein to close it off.  Laser treatment. A laser is used to heat the vein to close it off.  Radiofrequency vein ablation. An electrical current produced by radio waves is used to close off the vein.  Phlebectomy. The vein is surgically removed through small incisions made over the varicose vein.  Vein ligation and stripping. The vein is surgically removed through incisions made over the varicose vein  after the vein has been tied (ligated).  Follow these instructions at home:  Do not stand or sit in one position for long periods of time. Do not sit with your legs crossed. Rest with your legs raised during the day.  Wear compression stockings as directed by your health care provider. These stockings help to prevent blood clots and reduce swelling in your legs.  Do not wear other tight, encircling garments around your legs, pelvis, or waist.  Walk as much as possible to increase blood flow.  Raise the foot of your bed at night with 2-inch blocks.  If you get a cut in the skin over the vein and the vein bleeds, lie down with your leg raised and press on it with a clean cloth until the bleeding stops. Then place a bandage (dressing) on the cut. See your health care provider if it continues to bleed. Contact a health care provider if:  The skin around your ankle starts to break down.  You have pain, redness, tenderness, or hard swelling in your leg over a vein.  You are uncomfortable because of leg pain. This information is not intended to replace advice given to you by your health care provider. Make sure you discuss any questions you have with your health care provider. Document Released: 04/16/2005 Document Revised: 12/13/2015 Document Reviewed: 01/08/2016 Elsevier Interactive Patient Education  2017 Elsevier Inc.  Vein disease is a condition that can affect the veins in the legs. It can cause leg pain, varicose veins, swollen legs, or open  sores. Varicose veins are swollen and twisted veins. Things that may help: leg exercises (ankle flexion, walking),compression stocking, OTC horse chestnut seed extract 300 mg twice daily, for itchy skin cortisone and moisturizers.  Compression stockings- [facility] [place]  Please be sure medication list is accurate. If a new problem present, please set up appointment sooner than planned today.

## 2017-06-04 ENCOUNTER — Encounter: Payer: Self-pay | Admitting: Family Medicine

## 2017-07-01 ENCOUNTER — Other Ambulatory Visit: Payer: Self-pay

## 2017-07-01 DIAGNOSIS — J309 Allergic rhinitis, unspecified: Secondary | ICD-10-CM

## 2017-07-01 MED ORDER — FLUTICASONE PROPIONATE 50 MCG/ACT NA SUSP: 2.0000 | Freq: Every day | NASAL | 1 refills | Status: DC

## 2017-09-21 NOTE — Progress Notes (Signed)
HPI:   Jenny [name] is a 84 y.o. female, who is here today for 4 months follow up.   She was last seen on 05/26/2017.  Hypertension: Currently she is on bisoprolol-HCTZ  5-6.25 mg daily and Azor 5-40 mg daily.  She is not checking BP at home. Following low-salt diet.  Lab Results  Component Value Date   CREATININE 0.85 04/22/2017   BUN 17 04/22/2017   NA 137 04/22/2017   K 3.9 04/22/2017   CL 100 04/22/2017   CO2 27 04/22/2017   Denies severe/frequent headache, visual changes, chest pain, dyspnea, palpitation, focal weakness, or worsening edema. She has been feeling "off for balance", intermittently.  She thinks Furosemide is causing this problem. She denies recent falls.  Furosemide was started to help with lower extremity edema and pleural effusion. Chest CT in 04/2017 showed trace right-sided pleural effusion.  She denies having cough, dyspnea, orthopnea, or PND. Lower extremity edema is stable.  Anxiety: Currently she is on Xanax 0.25 mg daily as needed. She has been on Xanax for many years.  Today she is complaining of lack of motivation and fatigue. She denies suicidal thoughts.      Review of Systems  Constitutional: Positive for fatigue. Negative for activity change, appetite change and fever.  HENT: Negative for mouth sores, nosebleeds and trouble swallowing.   Eyes: Negative for redness and visual disturbance.  Respiratory: Negative for cough, shortness of breath and wheezing.   Cardiovascular: Positive for leg swelling. Negative for chest pain and palpitations.  Gastrointestinal: Negative for abdominal pain, nausea and vomiting.       Negative for changes in bowel habits.  Endocrine: Negative for cold intolerance and heat intolerance.  Genitourinary: Negative for decreased urine volume, dysuria and hematuria.  Skin: Negative for pallor and wound.  Neurological: Positive for light-headedness. Negative for syncope, weakness and headaches.    Psychiatric/Behavioral: Negative for confusion and sleep disturbance. The patient is nervous/anxious.      Current Outpatient Medications on File Prior to Visit  Medication Sig Dispense Refill  . amLODipine-olmesartan (AZOR) 5-40 MG tablet Take 1 tablet daily by mouth. 90 tablet 2  . aspirin 81 MG tablet Take 81 mg by mouth daily.    . bisoprolol-hydrochlorothiazide (ZIAC) 5-6.25 MG tablet Take 1 tablet daily by mouth. 90 tablet 2  . calcium-vitamin D (OSCAL WITH D 500-200) 500-200 MG-UNIT per tablet Take 1 tablet by mouth daily.      . fish oil-omega-3 fatty acids 1000 MG capsule Take 2 g by mouth daily.    . fluticasone (FLONASE) 50 MCG/ACT nasal spray Place 2 sprays into both nostrils daily. (Patient not taking: Reported on 09/22/2017) 48 g 1  . furosemide (LASIX) 20 MG tablet 2 each am until leg swelling then one daily 60 tablet 11  . Multiple Vitamin (MULTIVITAMIN) tablet Take 1 tablet by mouth daily.      . Vitamin D, Ergocalciferol, (DRISDOL) 50000 units CAPS capsule TAKE 1 CAPSULE EVERY OTHER WEEK 52 capsule 0   No current facility-administered medications on file prior to visit.      Past Medical History:  Diagnosis Date  . Anxiety   . Arthritis   . Hyperlipidemia   . Hypertension   . Osteoporosis    No Known Allergies  Social History   Socioeconomic History  . Marital status: Widowed    Spouse name: None  . Number of children: None  . Years of education: None  . Highest education  level: None  Social Needs  . Financial resource strain: None  . Food insecurity - worry: None  . Food insecurity - inability: None  . Transportation needs - medical: None  . Transportation needs - non-medical: None  Occupational History  . None  Tobacco Use  . Smoking status: Never Smoker  . Smokeless tobacco: Never Used  Substance and Sexual Activity  . Alcohol use: No  . Drug use: No  . Sexual activity: Not Currently  Other Topics Concern  . None  Social History Narrative    Widowed 2012. 2 children (lost 3rd son unknown cause at 21). 9 grandchildren. 2 greatgrandchildren.    Lives alone. Does everything for herself except yardwork. 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       Hobbies: day trips with [facility], time with family, enjoys meal gatherings    Vitals:   09/22/17 1424  BP: 126/84  Pulse: 60  Resp: 16  Temp: 98.1 F (36.7 C)  SpO2: 98%   Body mass index is 39.55 kg/m.   Physical Exam  Nursing note and vitals reviewed. Constitutional: She is oriented to person, place, and time. She appears well-developed. No distress.  HENT:  Head: Normocephalic and atraumatic.  Mouth/Throat: Oropharynx is clear and moist and mucous membranes are normal. She has dentures.  Eyes: Conjunctivae are normal. Pupils are equal, round, and reactive to light.  Cardiovascular: Normal rate. An irregular rhythm present.  No murmur heard. Respiratory: Effort normal and breath sounds normal. No respiratory distress.  GI: Soft. She exhibits no mass. There is no tenderness.  Musculoskeletal: She exhibits edema (2+ pitting LE edema,,bilateral.). She exhibits no tenderness.  Lymphadenopathy:    She has no cervical adenopathy.  Neurological: She is alert and oriented to person, place, and time. She has normal strength. Coordination normal.  Stable gait without assistance.  Skin: Skin is warm. No erythema.  A few scattered crusty lesions on the pretibial aspect of lower extremities.  No erythema, local heat, induration, or tenderness upon palpation.  Psychiatric: Her mood appears anxious.  Fairly groomed, good eye contact.    ASSESSMENT AND PLAN:   Jenny [name] was seen today for 4 months follow-up.  Orders Placed This Encounter  Procedures  . Basic metabolic panel   Lab Results  Component Value Date   CREATININE 0.84 09/22/2017   BUN 16 09/22/2017   NA 140  09/22/2017   K 3.7 09/22/2017   CL 98 09/22/2017   CO2 33 (H) 09/22/2017    Intermittent lightheadedness  Possible causes discussed. Adequate hydration. Furosemide frequency decreased. Fall prevention and instructions about worrisome symptoms discussed. F/U in 2 months,before if needed.  Anxiety disorder She will continue Xanax daily as needed. Celexa 10 mg added. Side effects discussed. Instructed about warning signs. F/U in 2 months,befire if needed.  Venous stasis dermatitis of both lower extremities Lasix decreased from daily to every other day. LE elevation. Adequate skin care discussed.   Essential hypertension Adequately controlled. No changes in current management for now.  DASH-low salt diet recommended.     Pleural effusion, bilateral Today she is reporting symptoms improvement. Echo in 03/2017 LVEF 55-60%, severe tricuspid valve regurgitation, and mild pulmonary hypertension. Chest CT in 04/2017 showed trace of right pleural effusion. Because she is complaining of lightheadedness, which she feels is caused by Furosemide, we will decrease Furosemide from 20 mg  daily to 20 mg every other day. She was instructed about warning signs.        -Ms. Jenny [name] was advised to return sooner than planned today if new concerns arise.       Jenny G. [place], MD  [facility] [place] office.

## 2017-09-22 ENCOUNTER — Ambulatory Visit (INDEPENDENT_AMBULATORY_CARE_PROVIDER_SITE_OTHER): Payer: Medicare Other | Admitting: Family Medicine

## 2017-09-22 ENCOUNTER — Encounter: Payer: Self-pay | Admitting: Family Medicine

## 2017-09-22 ENCOUNTER — Ambulatory Visit (INDEPENDENT_AMBULATORY_CARE_PROVIDER_SITE_OTHER): Payer: Medicare Other

## 2017-09-22 VITALS — BP 126/84 | Ht 62.0 in | Wt 216.0 lb

## 2017-09-22 VITALS — BP 126/84 | HR 60 | Temp 98.1°F | Resp 16 | Ht 62.0 in | Wt 216.2 lb

## 2017-09-22 DIAGNOSIS — F419 Anxiety disorder, unspecified: Secondary | ICD-10-CM | POA: Diagnosis not present

## 2017-09-22 DIAGNOSIS — I872 Venous insufficiency (chronic) (peripheral): Secondary | ICD-10-CM

## 2017-09-22 DIAGNOSIS — R42 Dizziness and giddiness: Secondary | ICD-10-CM | POA: Diagnosis not present

## 2017-09-22 DIAGNOSIS — I10 Essential (primary) hypertension: Secondary | ICD-10-CM | POA: Diagnosis not present

## 2017-09-22 DIAGNOSIS — J90 Pleural effusion, not elsewhere classified: Secondary | ICD-10-CM

## 2017-09-22 DIAGNOSIS — Z0000 Encounter for general adult medical examination without abnormal findings: Secondary | ICD-10-CM

## 2017-09-22 LAB — BASIC METABOLIC PANEL
BUN: 16 mg/dL (ref 6–23)
CALCIUM: 10.3 mg/dL (ref 8.4–10.5)
CO2: 33 mEq/L — ABNORMAL HIGH (ref 19–32)
Chloride: 98 mEq/L (ref 96–112)
Creatinine, Ser: 0.84 mg/dL (ref 0.40–1.20)
GFR: 69.02 mL/min (ref >60.00)
GLUCOSE: 91 mg/dL (ref 70–99)
Potassium: 3.7 mEq/L (ref 3.5–5.1)
Sodium: 140 mEq/L (ref 135–145)

## 2017-09-22 MED ORDER — CITALOPRAM HYDROBROMIDE 10 MG PO TABS: 10.0000 mg | ORAL_TABLET | Freq: Every day | ORAL | 1 refills | Status: DC

## 2017-09-22 MED ORDER — ALPRAZOLAM 0.25 MG PO TABS: 0.2500 mg | ORAL_TABLET | Freq: Every day | ORAL | 1 refills | Status: AC | PRN

## 2017-09-22 NOTE — Assessment & Plan Note (Signed)
She will continue Xanax daily as needed. Celexa 10 mg added. Side effects discussed. Instructed about warning signs. F/U in 2 months,befire if needed.

## 2017-09-22 NOTE — Patient Instructions (Addendum)
A few things to remember from today's visit:   Anxiety disorder, unspecified type - Plan: citalopram (CELEXA) 10 MG tablet  Intermittent lightheadedness - Plan: Basic metabolic panel  Anxiety state - Plan: ALPRAZolam (XANAX) 0.25 MG tablet  Today we started Celexa, this type of medications can increase suicidal risk. This is more prevalent among children,adolecents, and young adults with major depression or other psychiatric disorders. It can also make depression worse. Most common side effects are gastrointestinal, self limited after a few weeks: diarrhea, nausea, constipation  Or diarrhea among some.  In general it is well tolerated. We will follow closely.   Lasix every other day.      Please be sure medication list is accurate. If a new problem present, please set up appointment sooner than planned today.

## 2017-09-22 NOTE — Assessment & Plan Note (Signed)
Lasix decreased from daily to every other day. LE elevation. Adequate skin care discussed.

## 2017-09-22 NOTE — Patient Instructions (Addendum)
Jenny [name] , Thank you for taking time to come for your Medicare Wellness Visit. I appreciate your ongoing commitment to your health goals. Please review the following plan we discussed and let me know if I can assist you in the future.  Will make an apt to have your eyes examined  Shingrix is a vaccine for the prevention of Shingles in Adults 50 and older.  If you are on Medicare, you can request a prescription from your doctor to be filled at a pharmacy.  Please check with your benefits regarding applicable copays or out of pocket expenses.  The Shingrix is given in 2 vaccines approx 8 weeks apart. You must receive the 2nd dose prior to 6 months from receipt of the first.  (Has been exposed to chicken pox)      [employer]; [phone] [facility]; [phone] Get resource to get information on any and all community programs for [place]; "Aging Gracefully In Place" program; can request or apply  [employer]: [phone] Community Health Response Program -340-370-9643 Public Health Dept; Need to be a skilled visit but can assist with bathing as well; [phone]  Adult center for Enrichment;  Call Senior Line; [phone]  Adult day services include Adult Day Care, [place], Volunteer In [place], Education and Support Program  Help with Rx at [place]  Monday - Friday 8am to 10pm EST Sat- Sunday 9am to 7pm Patient help line [phone] Email support online at [employer]gl  Dept of Social Services; Call [phone] and ask for SW on call  Options for Medicaid include the Community Alternatives program; [place]org (personal care services) or PACE program, which is a medical and social program combined  Braulio Conte manages the community Alternatives program at the [employer]; 469-507-2257 (this is a program with a waiting list but provides SNF care at home;   [address] [address] required; Call Claiborne Billings and she will send out packet of information    Resource to find disability equipment (used) online; https://bradley.com/    These are the goals we discussed: Goals    . Patient Stated     Will go back to the [facility] and play bingo !!!    . Weight (lb) < 200 lb (90.7 kg)     Smaller portions;  Fat free or low fat dairy products Fish high in omega-3 acids ( salmon, tuna, trout) Fruits, such as apples, bananas, oranges, pears, prunes Legumes, such as kidney beans, lentils, checkpeas, black-eyed peas and lima beans Vegetables; broccoli, cabbage, carrots Whole grains;   Plant fats are better; decrease "white" foods as pasta, rice, bread and desserts, sugar; Avoid red meat (limiting) palm and coconut oils; sugary foods and beverages  Two nutrients that raise blood chol levels are saturated fats and trans fat; in hydrogenated oils and fats, as stick margarine, baked goods (cookes, cakes, pies, crackers; frosting; and coffee creamers;   Some Fats lower cholesterol: Monounsaturated and polyunsaturated   Review strategies for weight loss  Avocados Corn, sunflower, and soybean oils Nuts and seeds, such as walnuts Olive, canola, peanut, safflower, and sesame oils Peanut butter Salmon and trout Tofu       . Weight < 200 lb (90.719 kg)       This is a list of the screening recommended for you and due dates:  Health Maintenance  Topic Date Due  . Tetanus Vaccine  05/24/2022  . Flu Shot  Completed  . DEXA scan (bone  density measurement)  Completed  . Pneumonia vaccines  Completed    Health Maintenance, Female Adopting a healthy lifestyle and getting preventive care can go a long way to promote health and wellness. Talk with your health care provider about what schedule of regular examinations is right for you. This is a good chance for you to check in with your provider about disease prevention and staying healthy. In between checkups,  there are plenty of things you can do on your own. Experts have done a lot of research about which lifestyle changes and preventive measures are most likely to keep you healthy. Ask your health care provider for more information. Weight and diet Eat a healthy diet  Be sure to include plenty of vegetables, fruits, low-fat dairy products, and lean protein.  Do not eat a lot of foods high in solid fats, added sugars, or salt.  Get regular exercise. This is one of the most important things you can do for your health. ? Most adults should exercise for at least 150 minutes each week. The exercise should increase your heart rate and make you sweat (moderate-intensity exercise). ? Most adults should also do strengthening exercises at least twice a week. This is in addition to the moderate-intensity exercise.  Maintain a healthy weight  Body mass index (BMI) is a measurement that can be used to identify possible weight problems. It estimates body fat based on height and weight. Your health care provider can help determine your BMI and help you achieve or maintain a healthy weight.  For females 44 years of age and older: ? A BMI below 18.5 is considered underweight. ? A BMI of 18.5 to 24.9 is normal. ? A BMI of 25 to 29.9 is considered overweight. ? A BMI of 30 and above is considered obese.  Watch levels of cholesterol and blood lipids  You should start having your blood tested for lipids and cholesterol at 82 years of age, then have this test every 5 years.  You may need to have your cholesterol levels checked more often if: ? Your lipid or cholesterol levels are high. ? You are older than 82 years of age. ? You are at high risk for heart disease.  Cancer screening Lung Cancer  Lung cancer screening is recommended for adults 76-21 years old who are at high risk for lung cancer because of a history of smoking.  A yearly low-dose CT scan of the lungs is recommended for people  who: ? Currently smoke. ? Have quit within the past 15 years. ? Have at least a 30-pack-year history of smoking. A pack year is smoking an average of one pack of cigarettes a day for 1 year.  Yearly screening should continue until it has been 15 years since you quit.  Yearly screening should stop if you develop a health problem that would prevent you from having lung cancer treatment.  Breast Cancer  Practice breast self-awareness. This means understanding how your breasts normally appear and feel.  It also means doing regular breast self-exams. Let your health care provider know about any changes, no matter how small.  If you are in your 20s or 30s, you should have a clinical breast exam (CBE) by a health care provider every 1-3 years as part of a regular health exam.  If you are 74 or older, have a CBE every year. Also consider having a breast X-ray (mammogram) every year.  If you have a family history of breast cancer, talk to  your health care provider about genetic screening.  If you are at high risk for breast cancer, talk to your health care provider about having an MRI and a mammogram every year.  Breast cancer gene (BRCA) assessment is recommended for women who have family members with BRCA-related cancers. BRCA-related cancers include: ? Breast. ? Ovarian. ? Tubal. ? Peritoneal cancers.  Results of the assessment will determine the need for genetic counseling and BRCA1 and BRCA2 testing.  Cervical Cancer Your health care provider may recommend that you be screened regularly for cancer of the pelvic organs (ovaries, uterus, and vagina). This screening involves a pelvic examination, including checking for microscopic changes to the surface of your cervix (Pap test). You may be encouraged to have this screening done every 3 years, beginning at age 37.  For women ages 45-65, health care providers may recommend pelvic exams and Pap testing every 3 years, or they may recommend  the Pap and pelvic exam, combined with testing for human papilloma virus (HPV), every 5 years. Some types of HPV increase your risk of cervical cancer. Testing for HPV may also be done on women of any age with unclear Pap test results.  Other health care providers may not recommend any screening for nonpregnant women who are considered low risk for pelvic cancer and who do not have symptoms. Ask your health care provider if a screening pelvic exam is right for you.  If you have had past treatment for cervical cancer or a condition that could lead to cancer, you need Pap tests and screening for cancer for at least 20 years after your treatment. If Pap tests have been discontinued, your risk factors (such as having a new sexual partner) need to be reassessed to determine if screening should resume. Some women have medical problems that increase the chance of getting cervical cancer. In these cases, your health care provider may recommend more frequent screening and Pap tests.  Colorectal Cancer  This type of cancer can be detected and often prevented.  Routine colorectal cancer screening usually begins at 82 years of age and continues through 82 years of age.  Your health care provider may recommend screening at an earlier age if you have risk factors for colon cancer.  Your health care provider may also recommend using home test kits to check for hidden blood in the stool.  A small camera at the end of a tube can be used to examine your colon directly (sigmoidoscopy or colonoscopy). This is done to check for the earliest forms of colorectal cancer.  Routine screening usually begins at age 34.  Direct examination of the colon should be repeated every 5-10 years through 82 years of age. However, you may need to be screened more often if early forms of precancerous polyps or small growths are found.  Skin Cancer  Check your skin from head to toe regularly.  Tell your health care provider about  any new moles or changes in moles, especially if there is a change in a mole's shape or color.  Also tell your health care provider if you have a mole that is larger than the size of a pencil eraser.  Always use sunscreen. Apply sunscreen liberally and repeatedly throughout the day.  Protect yourself by wearing long sleeves, pants, a wide-brimmed hat, and sunglasses whenever you are outside.  Heart disease, diabetes, and high blood pressure  High blood pressure causes heart disease and increases the risk of stroke. High blood pressure is more likely  to develop in: ? People who have blood pressure in the high end of the normal range (130-139/85-89 mm Hg). ? People who are overweight or obese. ? People who are African American.  If you are 63-55 years of age, have your blood pressure checked every 3-5 years. If you are 36 years of age or older, have your blood pressure checked every year. You should have your blood pressure measured twice-once when you are at a hospital or clinic, and once when you are not at a hospital or clinic. Record the average of the two measurements. To check your blood pressure when you are not at a hospital or clinic, you can use: ? An automated blood pressure machine at a pharmacy. ? A home blood pressure monitor.  If you are between 7 years and 59 years old, ask your health care provider if you should take aspirin to prevent strokes.  Have regular diabetes screenings. This involves taking a blood sample to check your fasting blood sugar level. ? If you are at a normal weight and have a low risk for diabetes, have this test once every three years after 82 years of age. ? If you are overweight and have a high risk for diabetes, consider being tested at a younger age or more often. Preventing infection Hepatitis B  If you have a higher risk for hepatitis B, you should be screened for this virus. You are considered at high risk for hepatitis B if: ? You were born in  a country where hepatitis B is common. Ask your health care provider which countries are considered high risk. ? Your parents were born in a high-risk country, and you have not been immunized against hepatitis B (hepatitis B vaccine). ? You have HIV or AIDS. ? You use needles to inject street drugs. ? You live with someone who has hepatitis B. ? You have had sex with someone who has hepatitis B. ? You get hemodialysis treatment. ? You take certain medicines for conditions, including cancer, organ transplantation, and autoimmune conditions.  Hepatitis C  Blood testing is recommended for: ? Everyone born from [birth year] through 1963. ? Anyone with known risk factors for hepatitis C.  Sexually transmitted infections (STIs)  You should be screened for sexually transmitted infections (STIs) including gonorrhea and chlamydia if: ? You are sexually active and are younger than 82 years of age. ? You are older than 82 years of age and your health care provider tells you that you are at risk for this type of infection. ? Your sexual activity has changed since you were last screened and you are at an increased risk for chlamydia or gonorrhea. Ask your health care provider if you are at risk.  If you do not have HIV, but are at risk, it may be recommended that you take a prescription medicine daily to prevent HIV infection. This is called pre-exposure prophylaxis (PrEP). You are considered at risk if: ? You are sexually active and do not regularly use condoms or know the HIV status of your partner(s). ? You take drugs by injection. ? You are sexually active with a partner who has HIV.  Talk with your health care provider about whether you are at high risk of being infected with HIV. If you choose to begin PrEP, you should first be tested for HIV. You should then be tested every 3 months for as long as you are taking PrEP. Pregnancy  If you are premenopausal and you may become pregnant,  ask your health  care provider about preconception counseling.  If you may become pregnant, take 400 to 800 micrograms (mcg) of folic acid every day.  If you want to prevent pregnancy, talk to your health care provider about birth control (contraception). Osteoporosis and menopause  Osteoporosis is a disease in which the bones lose minerals and strength with aging. This can result in serious bone fractures. Your risk for osteoporosis can be identified using a bone density scan.  If you are 15 years of age or older, or if you are at risk for osteoporosis and fractures, ask your health care provider if you should be screened.  Ask your health care provider whether you should take a calcium or vitamin D supplement to lower your risk for osteoporosis.  Menopause may have certain physical symptoms and risks.  Hormone replacement therapy may reduce some of these symptoms and risks. Talk to your health care provider about whether hormone replacement therapy is right for you. Follow these instructions at home:  Schedule regular health, dental, and eye exams.  Stay current with your immunizations.  Do not use any tobacco products including cigarettes, chewing tobacco, or electronic cigarettes.  If you are pregnant, do not drink alcohol.  If you are breastfeeding, limit how much and how often you drink alcohol.  Limit alcohol intake to no more than 1 drink per day for nonpregnant women. One drink equals 12 ounces of beer, 5 ounces of wine, or 1 ounces of hard liquor.  Do not use street drugs.  Do not share needles.  Ask your health care provider for help if you need support or information about quitting drugs.  Tell your health care provider if you often feel depressed.  Tell your health care provider if you have ever been abused or do not feel safe at home. This information is not intended to replace advice given to you by your health care provider. Make sure you discuss any questions you have with  your health care provider. Document Released: 01/20/2011 Document Revised: 12/13/2015 Document Reviewed: 04/10/2015 Elsevier Interactive Patient Education  2018 [place] in the Home Falls can cause injuries and can affect people from all age groups. There are many simple things that you can do to make your home safe and to help prevent falls. What can I do on the outside of my home?  Regularly repair the edges of walkways and driveways and fix any cracks.  Remove high doorway thresholds.  Trim any shrubbery on the main path into your home.  Use bright outdoor lighting.  Clear walkways of debris and clutter, including tools and rocks.  Regularly check that handrails are securely fastened and in good repair. Both sides of any steps should have handrails.  Install guardrails along the edges of any raised decks or porches.  Have leaves, snow, and ice cleared regularly.  Use sand or salt on walkways during winter months.  In the garage, clean up any spills right away, including grease or oil spills. What can I do in the bathroom?  Use night lights.  Install grab bars by the toilet and in the tub and shower. Do not use towel bars as grab bars.  Use non-skid mats or decals on the floor of the tub or shower.  If you need to sit down while you are in the shower, use a plastic, non-slip stool.  Keep the floor dry. Immediately clean up any water that spills on the floor.  Remove  soap buildup in the tub or shower on a regular basis.  Attach bath mats securely with double-sided non-slip rug tape.  Remove throw rugs and other tripping hazards from the floor. What can I do in the bedroom?  Use night lights.  Make sure that a bedside light is easy to reach.  Do not use oversized bedding that drapes onto the floor.  Have a firm chair that has side arms to use for getting dressed.  Remove throw rugs and other tripping hazards from the floor. What can I do  in the kitchen?  Clean up any spills right away.  Avoid walking on wet floors.  Place frequently used items in easy-to-reach places.  If you need to reach for something above you, use a sturdy step stool that has a grab bar.  Keep electrical cables out of the way.  Do not use floor polish or wax that makes floors slippery. If you have to use wax, make sure that it is non-skid floor wax.  Remove throw rugs and other tripping hazards from the floor. What can I do in the stairways?  Do not leave any items on the stairs.  Make sure that there are handrails on both sides of the stairs. Fix handrails that are broken or loose. Make sure that handrails are as long as the stairways.  Check any carpeting to make sure that it is firmly attached to the stairs. Fix any carpet that is loose or worn.  Avoid having throw rugs at the top or bottom of stairways, or secure the rugs with carpet tape to prevent them from moving.  Make sure that you have a light switch at the top of the stairs and the bottom of the stairs. If you do not have them, have them installed. What are some other fall prevention tips?  Wear closed-toe shoes that fit well and support your feet. Wear shoes that have rubber soles or low heels.  When you use a stepladder, make sure that it is completely opened and that the sides are firmly locked. Have someone hold the ladder while you are using it. Do not climb a closed stepladder.  Add color or contrast paint or tape to grab bars and handrails in your home. Place contrasting color strips on the first and last steps.  Use mobility aids as needed, such as canes, walkers, scooters, and crutches.  Turn on lights if it is dark. Replace any light bulbs that burn out.  Set up furniture so that there are clear paths. Keep the furniture in the same spot.  Fix any uneven floor surfaces.  Choose a carpet design that does not hide the edge of steps of a stairway.  Be aware of any and  all pets.  Review your medicines with your healthcare provider. Some medicines can cause dizziness or changes in blood pressure, which increase your risk of falling. Talk with your health care provider about other ways that you can decrease your risk of falls. This may include working with a physical therapist or trainer to improve your strength, balance, and endurance. This information is not intended to replace advice given to you by your health care provider. Make sure you discuss any questions you have with your health care provider. Document Released: 06/27/2002 Document Revised: 12/04/2015 Document Reviewed: 08/11/2014 Elsevier Interactive Patient Education  [employer].

## 2017-09-22 NOTE — Assessment & Plan Note (Signed)
Adequately controlled. No changes in current management for now.  DASH-low salt diet recommended.

## 2017-09-22 NOTE — Assessment & Plan Note (Signed)
Today she is reporting symptoms improvement. Echo in 03/2017 LVEF 55-60%, severe tricuspid valve regurgitation, and mild pulmonary hypertension. Chest CT in 04/2017 showed trace of right pleural effusion. Because she is complaining of lightheadedness, which she feels is caused by Furosemide, we will decrease Furosemide from 20 mg daily to 20 mg every other day. She was instructed about warning signs.

## 2017-09-22 NOTE — Progress Notes (Signed)
Subjective:   Jenny [name] is a 84 y.o. female who presents for Medicare Annual (Subsequent) preventive examination.  Reports health as fair One dtr lives in [place] One son; wife is a Publishing rights manager Lost one son 38; Spouse passed 2012 Lives alone;  Was care giving older cousin Long term plan is to move in with son if she fails independent living   Diet Chol/hdl ratio 5; hdl 34; trig 161 in 2017 Breakfast was oatmeal and toast, egg Lunch; lean cusine or salad Supper varies  Neighbor brings her lunch 2 days a week  BMI 39.5   No pain Sleeps well at hs  Cough was keeping her awake   Exercise Was going to the [place] [facility] some  Has stopped this due to not feeling well  Feels winded at times after running errands    There are no preventive care reminders to display for this patient.  Mammogram 03/2009- waived by the patient Dexa 03/2016 -2.6 (declined fosamax) Was going to ask dtr in law about medicine    Educate regarding shingrix - declines for now        Objective:     Vitals: BP 126/84   Ht 5\' 2"  (1.575 m)   Wt 216 lb (98 kg)   BMI 39.51 kg/m   Body mass index is 39.51 kg/m.  Advanced Directives 09/22/2017 06/10/2016  Does Patient Have a Medical Advance Directive? Yes Yes     Tobacco Social History   Tobacco Use  Smoking Status Never Smoker  Smokeless Tobacco Never Used     Counseling given: Yes   Clinical Intake:    Past Medical History:  Diagnosis Date  . Anxiety   . Arthritis   . Hyperlipidemia   . Hypertension   . Osteoporosis    Past Surgical History:  Procedure Laterality Date  . ABDOMINAL HYSTERECTOMY     still has ovaries. Thinks they took cervix. No pap smears  . APPENDECTOMY    . CHOLECYSTECTOMY     Family History  Problem Relation Age of Onset  . Prostate cancer Father   . Hypertension Father   . Parkinsonism Brother    Social History   Socioeconomic History  . Marital status: Widowed    Spouse name:  Not on file  . Number of children: Not on file  . Years of education: Not on file  . Highest education level: Not on file  Social Needs  . Financial resource strain: Not on file  . Food insecurity - worry: Not on file  . Food insecurity - inability: Not on file  . Transportation needs - medical: Not on file  . Transportation needs - non-medical: Not on file  Occupational History  . Not on file  Tobacco Use  . Smoking status: Never Smoker  . Smokeless tobacco: Never Used  Substance and Sexual Activity  . Alcohol use: No  . Drug use: No  . Sexual activity: Not Currently  Other Topics Concern  . Not on file  Social History Narrative   Widowed 2012. 2 children (lost 3rd son unknown cause at 17). 9 grandchildren. 2 greatgrandchildren.    Lives alone. Does everything for herself except yardwork. 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       Hobbies: day trips with [facility] center, time with family, enjoys meal gatherings    Outpatient Encounter Medications as of 09/22/2017  Medication Sig  . ALPRAZolam (XANAX) 0.25 MG tablet Take 1 tablet (0.25 mg total) by mouth daily as needed for anxiety.  Marland Kitchen. amLODipine-olmesartan (AZOR) 5-40 MG tablet Take 1 tablet daily by mouth.  Marland Kitchen. aspirin 81 MG tablet Take 81 mg by mouth daily.  . bisoprolol-hydrochlorothiazide (ZIAC) 5-6.25 MG tablet Take 1 tablet daily by mouth.  . calcium-vitamin D (OSCAL WITH D 500-200) 500-200 MG-UNIT per tablet Take 1 tablet by mouth daily.    . citalopram (CELEXA) 10 MG tablet Take 1 tablet (10 mg total) by mouth daily.  . fish oil-omega-3 fatty acids 1000 MG capsule Take 2 g by mouth daily.  . furosemide (LASIX) 20 MG tablet 2 each am until leg swelling then one daily  . Multiple Vitamin (MULTIVITAMIN) tablet Take 1 tablet by mouth daily.    . Vitamin D, Ergocalciferol, (DRISDOL) 50000 units CAPS capsule TAKE 1 CAPSULE EVERY OTHER WEEK  .  fluticasone (FLONASE) 50 MCG/ACT nasal spray Place 2 sprays into both nostrils daily. (Patient not taking: Reported on 09/22/2017)  . [DISCONTINUED] ALPRAZolam (XANAX) 0.25 MG tablet TAKE 1 TABLET EVERY 6 HOURS AS NEEDED   No facility-administered encounter medications on file as of 09/22/2017.     Activities of Daily Living In your present state of health, do you have any difficulty performing the following activities: 09/22/2017  Hearing? N  Vision? N  Difficulty concentrating or making decisions? N  Comment no issues   Walking or climbing stairs? N  Dressing or bathing? N  Doing errands, shopping? N  Preparing Food and eating ? N  Using the Toilet? N  In the past six months, have you accidently leaked urine? N  Do you have problems with loss of bowel control? N  Managing your Medications? N  Managing your Finances? N  Housekeeping or managing your Housekeeping? N  Some recent data might be hidden    Patient Care Team: SwazilandJordan, Betty G, MD as PCP - General (Family Medicine)    Assessment:   This is a routine wellness examination for [facility].  Exercise Activities and Dietary recommendations    Goals    . Patient Stated     [facility] !!!    . Weight (lb) < 200 lb (90.7 kg)     Smaller portions;  Fat free or low fat dairy products Fish high in omega-3 acids ( salmon, tuna, trout) Fruits, such as apples, bananas, oranges, pears, prunes Legumes, such as kidney beans, lentils, checkpeas, black-eyed peas and lima beans Vegetables; broccoli, cabbage, carrots Whole grains;   Plant fats are better; decrease "white" foods as pasta, rice, bread and desserts, sugar; Avoid red meat (limiting) palm and coconut oils; sugary foods and beverages  Two nutrients that raise blood chol levels are saturated fats and trans fat; in hydrogenated oils and fats, as stick margarine, baked goods (cookes, cakes, pies, crackers; frosting; and coffee creamers;   Some  Fats lower cholesterol: Monounsaturated and polyunsaturated   Review strategies for weight loss  Avocados Corn, sunflower, and soybean oils Nuts and seeds, such as walnuts Olive, canola, peanut, safflower, and sesame oils Peanut butter Salmon and trout Tofu       . Weight < 200 lb (90.719 kg)       Fall Risk Fall Risk  09/22/2017 06/10/2016 09/18/2015 09/13/2014 07/29/2013  Falls in the past year? No No No No No     Depression Screen [facility] Scores 09/22/2017 09/22/2017 06/10/2016 09/18/2015  PHQ - 2 Score 0 6 0 0  PHQ- 9 Score - 9 - -    Completed by Dr. Swaziland and evaluated and treated  Cognitive Function MMSE - Mini Mental State Exam 09/22/2017 06/10/2016  Not completed: (No Data) (No Data)     Ad8 score reviewed for issues:  Issues making decisions:  Less interest in hobbies / activities:  Repeats questions, stories (family complaining):  Trouble using ordinary gadgets (microwave, computer, phone):  Forgets the month or year:   Mismanaging finances:   Remembering appts:  Daily problems with thinking and/or memory: Ad8 score is=0        Immunization History  Administered Date(s) Administered  . Influenza Split 04/14/2012  . Influenza Whole 04/05/2009, 04/21/2011  . Influenza, High Dose Seasonal PF 03/17/2016  . Influenza,inj,Quad PF,6+ Mos 03/16/2015  . Influenza-Unspecified 04/20/2014, 04/18/2017  . Pneumococcal Conjugate-13 03/16/2015  . Pneumococcal Polysaccharide-23 07/22/2001, 05/24/2012  . Td 07/21/2001  . Tdap 05/24/2012      Screening Tests Health Maintenance  Topic Date Due  . TETANUS/TDAP  05/24/2022  . INFLUENZA VACCINE  Completed  . DEXA SCAN  Completed  . PNA vac Low Risk Adult  Completed         Plan:      PCP Notes   Health Maintenance No preventive health due  States she wants to get back to the [employer] Given resources for resources for older adults in [place]  Abnormal Screens  No c/o; legs continue to be edematous  but states they are better. Get up and go is good; still goes to the grocery store  Started on anti-depressant per Dr. Swaziland  Referrals  None today  Patient concerns; Getting back her stamina   Nurse Concerns; As noted   Next PCP apt Was seen today    I have personally reviewed and noted the following in the patient's chart:   . Medical and social history . Use of alcohol, tobacco or illicit drugs  . Current medications and supplements . Functional ability and status . Nutritional status . Physical activity . Advanced directives . List of other physicians . Hospitalizations, surgeries, and ER visits in previous 12 months . Vitals . Screenings to include cognitive, depression, and falls . Referrals and appointments  In addition, I have reviewed and discussed with patient certain preventive protocols, quality metrics, and best practice recommendations. A written personalized care plan for preventive services as well as general preventive health recommendations were provided to patient.     Montine Circle, RN  09/22/2017

## 2017-10-04 NOTE — Progress Notes (Signed)
I have reviewed documentation from this visit and I agree with recommendations given.  Betty G. Jordan, MD  [facility]. [place] office.   

## 2017-10-20 ENCOUNTER — Telehealth: Payer: Self-pay | Admitting: *Deleted

## 2017-10-20 NOTE — Telephone Encounter (Signed)
Pharmacy requesting 90 day supply of Citalopram HBR 10 mg tab, one tab by mouth every day.

## 2017-10-21 ENCOUNTER — Other Ambulatory Visit: Payer: Self-pay | Admitting: Family Medicine

## 2017-10-21 DIAGNOSIS — F419 Anxiety disorder, unspecified: Secondary | ICD-10-CM

## 2017-10-21 MED ORDER — CITALOPRAM HYDROBROMIDE 10 MG PO TABS: 10.0000 mg | ORAL_TABLET | Freq: Every day | ORAL | 0 refills | Status: DC

## 2017-10-21 NOTE — Telephone Encounter (Signed)
Rx for Celexa 10 mg sent #90/0. Thanks, BJ

## 2017-11-22 NOTE — Progress Notes (Deleted)
HPI:   Jenny [name] is a 84 y.o. female, who is here today to follow on recent OV.   She was seen on 09/22/17,when Lexapro 10 mg was added because worsening anxiety. She is also on Xanax 0.25 mg daily as needed.  ***  Also Furosemide was decreased for daily to every other day. Hx of varicose vein disease with stasis dermatitis.  ***   Overall *** is feeling better, no new symptoms reported.       Review of Systems    Current Outpatient Medications on File Prior to Visit  Medication Sig Dispense Refill  . ALPRAZolam (XANAX) 0.25 MG tablet Take 1 tablet (0.25 mg total) by mouth daily as needed for anxiety. 20 tablet 1  . amLODipine-olmesartan (AZOR) 5-40 MG tablet Take 1 tablet daily by mouth. 90 tablet 2  . aspirin 81 MG tablet Take 81 mg by mouth daily.    . bisoprolol-hydrochlorothiazide (ZIAC) 5-6.25 MG tablet Take 1 tablet daily by mouth. 90 tablet 2  . calcium-vitamin D (OSCAL WITH D 500-200) 500-200 MG-UNIT per tablet Take 1 tablet by mouth daily.      . citalopram (CELEXA) 10 MG tablet Take 1 tablet (10 mg total) by mouth daily. 90 tablet 0  . fish oil-omega-3 fatty acids 1000 MG capsule Take 2 g by mouth daily.    . fluticasone (FLONASE) 50 MCG/ACT nasal spray Place 2 sprays into both nostrils daily. (Patient not taking: Reported on 09/22/2017) 48 g 1  . furosemide (LASIX) 20 MG tablet 2 each am until leg swelling then one daily 60 tablet 11  . Multiple Vitamin (MULTIVITAMIN) tablet Take 1 tablet by mouth daily.      . Vitamin D, Ergocalciferol, (DRISDOL) 50000 units CAPS capsule TAKE 1 CAPSULE EVERY OTHER WEEK 52 capsule 0   No current facility-administered medications on file prior to visit.      Past Medical History:  Diagnosis Date  . Anxiety   . Arthritis   . Hyperlipidemia   . Hypertension   . Osteoporosis    No Known Allergies  Social History   Socioeconomic History  . Marital status: Widowed    Spouse name: Not on file  . Number of  children: Not on file  . Years of education: Not on file  . Highest education level: Not on file  Occupational History  . Not on file  Social Needs  . Financial resource strain: Not on file  . Food insecurity:    Worry: Not on file    Inability: Not on file  . Transportation needs:    Medical: Not on file    Non-medical: Not on file  Tobacco Use  . Smoking status: Never Smoker  . Smokeless tobacco: Never Used  Substance and Sexual Activity  . Alcohol use: No  . Drug use: No  . Sexual activity: Not Currently  Lifestyle  . Physical activity:    Days per week: Not on file    Minutes per session: Not on file  . Stress: Not on file  Relationships  . Social connections:    Talks on phone: Not on file    Gets together: Not on file    Attends religious service: Not on file    Active member of club or organization: Not on file    Attends meetings of clubs or organizations: Not on file    Relationship status: Not on file  Other Topics Concern  . Not on file  Social History Narrative   Widowed 2012. 2 children (lost 3rd son unknown cause at 10). 9 grandchildren. 2 greatgrandchildren.    Lives alone. Does everything for herself except yardwork. 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       Hobbies: day trips with [name] center, time with family, enjoys meal gatherings    There were no vitals filed for this visit. There is no height or weight on file to calculate BMI.      Physical Exam  ASSESSMENT AND PLAN:  There are no diagnoses linked to this encounter.   No orders of the defined types were placed in this encounter.   No problem-specific Assessment & Plan notes found for this encounter.      Jenny G. Swaziland, MD  [facility]. [place] office.

## 2017-11-23 ENCOUNTER — Ambulatory Visit: Payer: Medicare Other | Admitting: Family Medicine

## 2017-12-01 ENCOUNTER — Encounter: Payer: Self-pay | Admitting: Family Medicine

## 2017-12-01 ENCOUNTER — Ambulatory Visit (INDEPENDENT_AMBULATORY_CARE_PROVIDER_SITE_OTHER): Payer: Medicare Other | Admitting: Family Medicine

## 2017-12-01 VITALS — BP 130/83 | HR 77 | Temp 98.3°F | Resp 12 | Ht 62.0 in | Wt 245.0 lb

## 2017-12-01 DIAGNOSIS — I872 Venous insufficiency (chronic) (peripheral): Secondary | ICD-10-CM | POA: Diagnosis not present

## 2017-12-01 DIAGNOSIS — R233 Spontaneous ecchymoses: Secondary | ICD-10-CM | POA: Diagnosis not present

## 2017-12-01 DIAGNOSIS — G8929 Other chronic pain: Secondary | ICD-10-CM | POA: Diagnosis not present

## 2017-12-01 DIAGNOSIS — F419 Anxiety disorder, unspecified: Secondary | ICD-10-CM | POA: Diagnosis not present

## 2017-12-01 DIAGNOSIS — M545 Low back pain, unspecified: Secondary | ICD-10-CM

## 2017-12-01 DIAGNOSIS — R2681 Unsteadiness on feet: Secondary | ICD-10-CM | POA: Diagnosis not present

## 2017-12-01 MED ORDER — ESCITALOPRAM OXALATE 20 MG PO TABS: 20.0000 mg | ORAL_TABLET | Freq: Every day | ORAL | 0 refills | Status: DC

## 2017-12-01 MED ORDER — CITALOPRAM HYDROBROMIDE 20 MG PO TABS: 20.0000 mg | ORAL_TABLET | Freq: Every day | ORAL | 1 refills | Status: DC

## 2017-12-01 MED ORDER — ESCITALOPRAM OXALATE 20 MG PO TABS: 20.0000 mg | ORAL_TABLET | Freq: Every day | ORAL | 1 refills | Status: DC

## 2017-12-01 NOTE — Assessment & Plan Note (Signed)
I think she will benefit from psychotherapy, information given so she can call and arrange appointment. Celexa increased from 10 mg to 20 mg and continue Xanax 0.25 mg daily as needed. Instructed about warning signs. We discussed some side effects of these medications. Follow-up in 6 weeks, before if needed.

## 2017-12-01 NOTE — Assessment & Plan Note (Signed)
Educated about diagnosis. Aleve may aggravate problem. Try to avoid trauma as much as possible. Follow-up as needed.

## 2017-12-01 NOTE — Patient Instructions (Addendum)
A few things to remember from today's visit:   Unstable gait - Plan: Ambulatory referral to Home Health  Venous stasis dermatitis of both lower extremities  Anxiety disorder, unspecified type - Plan: citalopram (CELEXA) 20 MG tablet  Chronic bilateral low back pain without sciatica Celexa increased from 10 mg to 20 mg.   Vein disease is a condition that can affect the veins in the legs. It can cause leg pain, varicose veins, swollen legs, or open sores. Varicose veins are swollen and twisted veins. Things that may help: leg exercises (ankle flexion, walking),compression stocking, OTC horse chestnut seed extract 300 mg twice daily, for itchy skin cortisone and moisturizers.  Compression stockings- Elastic Therapy in    Please be sure medication list is accurate. If a new problem present, please set up appointment sooner than planned today.

## 2017-12-01 NOTE — Assessment & Plan Note (Signed)
Continue Furosemide daily, we discussed some side effects. We discussed prognosis as well as treatment options. Compression stockings are the idea if she can put it on. OTC horse chestnut seed extract 300 mg bid may also help. Lower extremity elevation and skin care.

## 2017-12-01 NOTE — Progress Notes (Signed)
HPI:   Jenny [name] is a 84 y.o. female, who is here today with her niece to follow on recent OV.   She was seen on 09/22/17,when Celexa 10 mg was added because worsening anxiety.Lexapro has not helped much, no side effects.  She is also on Xanax 0.25 mg daily as needed. Her niece thinks Celexa has not worked because she is taking such a low dose, states that she is also on same medication and she takes Celexa 40 mg daily.  Reporting some stress related to sister's recent fall.  She is frustrated because she does not have the energy to take care of her sister and keep up with her house.  She does not feel motivated to go out. She denies suicidal thoughts.   Also Furosemide was decreased for daily to every other day. She is taking Furosemide daily because LE edema has been worse.   Hx of varicose vein disease with stasis dermatitis.  Lower extremity edema mildly worse since the weather has changed. It is usually worse at the end of the day. She has not had leg pain or changes in dermatitis. She is not wearing compression stockings.   She is complaining of lower back pain, no radiated. Intermittently for years, stable. Achy pain. 8/10. When she has pain she cannot function. She takes Aleve daily as needed. Denies saddle anesthesia or changes in bowel/urine continence.  Poor balance,no falls. She does not have a cane or walker.  Her niece thinks he may benefit from sports medicine evaluation.  States that she has history of sciatic pain and sees Dr. Katrinka Blazing, she would like Jenny [name] to see him as well.   She is also complaining of easy bruising, upper extremities mainly with minor trauma. No pain, no nose or gum bleeding.   Review of Systems  Constitutional: Positive for fatigue. Negative for activity change, appetite change and fever.  HENT: Negative for mouth sores, nosebleeds and trouble swallowing.   Respiratory: Negative for cough, shortness of breath and  wheezing.   Cardiovascular: Positive for leg swelling. Negative for chest pain and palpitations.  Gastrointestinal: Negative for abdominal pain, nausea and vomiting.       Negative for changes in bowel habits.  Genitourinary: Negative for decreased urine volume, dysuria and hematuria.  Musculoskeletal: Positive for arthralgias, back pain and gait problem.  Skin: Negative for rash and wound.  Allergic/Immunologic: Positive for environmental allergies.  Neurological: Negative for syncope, weakness and headaches.  Hematological: Bruises/bleeds easily.  Psychiatric/Behavioral: Negative for confusion and suicidal ideas. The patient is nervous/anxious.       Current Outpatient Medications on File Prior to Visit  Medication Sig Dispense Refill  . ALPRAZolam (XANAX) 0.25 MG tablet Take 1 tablet (0.25 mg total) by mouth daily as needed for anxiety. 20 tablet 1  . amLODipine-olmesartan (AZOR) 5-40 MG tablet Take 1 tablet daily by mouth. 90 tablet 2  . aspirin 81 MG tablet Take 81 mg by mouth daily.    . bisoprolol-hydrochlorothiazide (ZIAC) 5-6.25 MG tablet Take 1 tablet daily by mouth. 90 tablet 2  . calcium-vitamin D (OSCAL WITH D 500-200) 500-200 MG-UNIT per tablet Take 1 tablet by mouth daily.      . fish oil-omega-3 fatty acids 1000 MG capsule Take 2 g by mouth daily.    . fluticasone (FLONASE) 50 MCG/ACT nasal spray Place 2 sprays into both nostrils daily. 48 g 1  . furosemide (LASIX) 20 MG tablet 2 each am until leg  swelling then one daily 60 tablet 11  . Multiple Vitamin (MULTIVITAMIN) tablet Take 1 tablet by mouth daily.      . Vitamin D, Ergocalciferol, (DRISDOL) 50000 units CAPS capsule TAKE 1 CAPSULE EVERY OTHER WEEK 52 capsule 0   No current facility-administered medications on file prior to visit.      Past Medical History:  Diagnosis Date  . Anxiety   . Arthritis   . Hyperlipidemia   . Hypertension   . Osteoporosis    No Known Allergies  Social History   Socioeconomic  History  . Marital status: Widowed    Spouse name: Not on file  . Number of children: Not on file  . Years of education: Not on file  . Highest education level: Not on file  Occupational History  . Not on file  Social Needs  . Financial resource strain: Not on file  . Food insecurity:    Worry: Not on file    Inability: Not on file  . Transportation needs:    Medical: Not on file    Non-medical: Not on file  Tobacco Use  . Smoking status: Never Smoker  . Smokeless tobacco: Never Used  Substance and Sexual Activity  . Alcohol use: No  . Drug use: No  . Sexual activity: Not Currently  Lifestyle  . Physical activity:    Days per week: Not on file    Minutes per session: Not on file  . Stress: Not on file  Relationships  . Social connections:    Talks on phone: Not on file    Gets together: Not on file    Attends religious service: Not on file    Active member of club or organization: Not on file    Attends meetings of clubs or organizations: Not on file    Relationship status: Not on file  Other Topics Concern  . Not on file  Social History Narrative   Widowed 2012. 2 children (lost 3rd son unknown cause at 25). 9 grandchildren. 2 greatgrandchildren.    Lives alone. Does everything for herself except yardwork. 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       Hobbies: day trips with [facility] center, time with family, enjoys meal gatherings    Vitals:   12/01/17 1353  BP: 130/83  Pulse: 77  Resp: 12  Temp: 98.3 F (36.8 C)  SpO2: 97%   Body mass index is 44.81 kg/m.  Physical Exam  Nursing note and vitals reviewed. Constitutional: She is oriented to person, place, and time. She appears well-developed. No distress.  HENT:  Head: Normocephalic and atraumatic.  Mouth/Throat: Oropharynx is clear and moist and mucous membranes are normal.  Eyes: Pupils are equal, round, and reactive  to light. Conjunctivae are normal.  Cardiovascular: Normal rate. An irregular rhythm present.  Occasional extrasystoles are present.  No murmur heard. Pulses:      Dorsalis pedis pulses are 2+ on the right side, and 2+ on the left side.  Respiratory: Effort normal and breath sounds normal. No respiratory distress.  GI: Soft. She exhibits no mass. There is no tenderness.  Musculoskeletal: She exhibits edema (1+ pitting LE edema,bilateral.).  Lymphadenopathy:    She has no cervical adenopathy.  Neurological: She is alert and oriented to person, place, and time. She has normal strength. Gait abnormal.  Skin: Skin is warm. Rash noted. Rash is maculopapular.  No erythema.  LE with skin changes related to vein disease,pretibial erythema,no local heat. + papular scaly lesion pretibial ,bilateral.  Psychiatric: Her mood appears anxious. She exhibits a depressed mood.  Well groomed, good eye contact.    ASSESSMENT AND PLAN:  Jenny. Keyandra was seen today for follow-up.  Orders Placed This Encounter  Procedures  . Ambulatory referral to Home Health    Chronic bilateral low back pain without sciatica  She is reporting problem as stable and she is not interested in ortho/sport medicine referral. Home PT will be arranged. Some side effects of NSAID's, she can take Aleve 220 daily as needed and Tylenol.   Senile ecchymosis Educated about diagnosis. Aleve may aggravate problem. Try to avoid trauma as much as possible. Follow-up as needed.  Unstable gait She does not think she needs assistance. PT/OT evaluation will be arranged through home health. Fall precautions discussed.   Venous stasis dermatitis of both lower extremities Continue Furosemide daily, we discussed some side effects. We discussed prognosis as well as treatment options. Compression stockings are the idea if she can put it on. OTC horse chestnut seed extract 300 mg bid may also help. Lower extremity elevation and skin  care.  Anxiety disorder I think she will benefit from psychotherapy, information given so she can call and arrange appointment. Celexa increased from 10 mg to 20 mg and continue Xanax 0.25 mg daily as needed. Instructed about warning signs. We discussed some side effects of these medications. Follow-up in 6 weeks, before if needed.      Betty G. Sw[place]iland, MD  [facility]. [place] office.

## 2017-12-01 NOTE — Assessment & Plan Note (Signed)
She does not think she needs assistance. PT/OT evaluation will be arranged through home health. Fall precautions discussed.

## 2017-12-07 ENCOUNTER — Telehealth: Payer: Self-pay | Admitting: Family Medicine

## 2017-12-07 DIAGNOSIS — M533 Sacrococcygeal disorders, not elsewhere classified: Secondary | ICD-10-CM | POA: Diagnosis not present

## 2017-12-07 DIAGNOSIS — I10 Essential (primary) hypertension: Secondary | ICD-10-CM | POA: Diagnosis not present

## 2017-12-07 DIAGNOSIS — Z6841 Body Mass Index (BMI) 40.0 and over, adult: Secondary | ICD-10-CM | POA: Diagnosis not present

## 2017-12-07 DIAGNOSIS — M810 Age-related osteoporosis without current pathological fracture: Secondary | ICD-10-CM | POA: Diagnosis not present

## 2017-12-07 DIAGNOSIS — Z7982 Long term (current) use of aspirin: Secondary | ICD-10-CM | POA: Diagnosis not present

## 2017-12-07 DIAGNOSIS — Z9181 History of falling: Secondary | ICD-10-CM | POA: Diagnosis not present

## 2017-12-07 DIAGNOSIS — Z7951 Long term (current) use of inhaled steroids: Secondary | ICD-10-CM | POA: Diagnosis not present

## 2017-12-07 DIAGNOSIS — F419 Anxiety disorder, unspecified: Secondary | ICD-10-CM | POA: Diagnosis not present

## 2017-12-07 NOTE — Telephone Encounter (Signed)
Message sent to Dr. Jordan for review and approval. 

## 2017-12-07 NOTE — Telephone Encounter (Unsigned)
Copied from CRM [id number]. Topic: Quick Communication - See Telephone Encounter >> Dec 07, 2017  3:47 PM Floria Raveling A wrote: CRM for notification. See Telephone encounter for: 12/07/17. Tatiana with [facility] [id number] Needing verbals for PT  2 week 5 effective today

## 2017-12-08 NOTE — Telephone Encounter (Signed)
He is okay to give verbal authorization for PT as requested.  BJ

## 2017-12-08 NOTE — Telephone Encounter (Signed)
Spoke with Kyla Balzarine, gave verbal orders for PT per Dr. Swaziland.

## 2017-12-11 DIAGNOSIS — M533 Sacrococcygeal disorders, not elsewhere classified: Secondary | ICD-10-CM | POA: Diagnosis not present

## 2017-12-11 DIAGNOSIS — M810 Age-related osteoporosis without current pathological fracture: Secondary | ICD-10-CM | POA: Diagnosis not present

## 2017-12-11 DIAGNOSIS — F419 Anxiety disorder, unspecified: Secondary | ICD-10-CM | POA: Diagnosis not present

## 2017-12-11 DIAGNOSIS — Z6841 Body Mass Index (BMI) 40.0 and over, adult: Secondary | ICD-10-CM | POA: Diagnosis not present

## 2017-12-11 DIAGNOSIS — I10 Essential (primary) hypertension: Secondary | ICD-10-CM | POA: Diagnosis not present

## 2017-12-16 DIAGNOSIS — M533 Sacrococcygeal disorders, not elsewhere classified: Secondary | ICD-10-CM | POA: Diagnosis not present

## 2017-12-16 DIAGNOSIS — F419 Anxiety disorder, unspecified: Secondary | ICD-10-CM | POA: Diagnosis not present

## 2017-12-16 DIAGNOSIS — Z6841 Body Mass Index (BMI) 40.0 and over, adult: Secondary | ICD-10-CM | POA: Diagnosis not present

## 2017-12-16 DIAGNOSIS — M810 Age-related osteoporosis without current pathological fracture: Secondary | ICD-10-CM | POA: Diagnosis not present

## 2017-12-16 DIAGNOSIS — I10 Essential (primary) hypertension: Secondary | ICD-10-CM | POA: Diagnosis not present

## 2017-12-18 DIAGNOSIS — F419 Anxiety disorder, unspecified: Secondary | ICD-10-CM | POA: Diagnosis not present

## 2017-12-18 DIAGNOSIS — M533 Sacrococcygeal disorders, not elsewhere classified: Secondary | ICD-10-CM | POA: Diagnosis not present

## 2017-12-18 DIAGNOSIS — Z6841 Body Mass Index (BMI) 40.0 and over, adult: Secondary | ICD-10-CM | POA: Diagnosis not present

## 2017-12-18 DIAGNOSIS — I10 Essential (primary) hypertension: Secondary | ICD-10-CM | POA: Diagnosis not present

## 2017-12-18 DIAGNOSIS — M810 Age-related osteoporosis without current pathological fracture: Secondary | ICD-10-CM | POA: Diagnosis not present

## 2017-12-21 DIAGNOSIS — M533 Sacrococcygeal disorders, not elsewhere classified: Secondary | ICD-10-CM | POA: Diagnosis not present

## 2017-12-21 DIAGNOSIS — I10 Essential (primary) hypertension: Secondary | ICD-10-CM | POA: Diagnosis not present

## 2017-12-21 DIAGNOSIS — M810 Age-related osteoporosis without current pathological fracture: Secondary | ICD-10-CM | POA: Diagnosis not present

## 2017-12-21 DIAGNOSIS — F419 Anxiety disorder, unspecified: Secondary | ICD-10-CM | POA: Diagnosis not present

## 2017-12-21 DIAGNOSIS — Z6841 Body Mass Index (BMI) 40.0 and over, adult: Secondary | ICD-10-CM | POA: Diagnosis not present

## 2017-12-23 DIAGNOSIS — Z6841 Body Mass Index (BMI) 40.0 and over, adult: Secondary | ICD-10-CM | POA: Diagnosis not present

## 2017-12-23 DIAGNOSIS — M810 Age-related osteoporosis without current pathological fracture: Secondary | ICD-10-CM | POA: Diagnosis not present

## 2017-12-23 DIAGNOSIS — M533 Sacrococcygeal disorders, not elsewhere classified: Secondary | ICD-10-CM | POA: Diagnosis not present

## 2017-12-23 DIAGNOSIS — F419 Anxiety disorder, unspecified: Secondary | ICD-10-CM | POA: Diagnosis not present

## 2017-12-23 DIAGNOSIS — I10 Essential (primary) hypertension: Secondary | ICD-10-CM | POA: Diagnosis not present

## 2017-12-28 ENCOUNTER — Telehealth: Payer: Self-pay | Admitting: *Deleted

## 2017-12-28 ENCOUNTER — Other Ambulatory Visit: Payer: Self-pay | Admitting: Family Medicine

## 2017-12-28 DIAGNOSIS — F419 Anxiety disorder, unspecified: Secondary | ICD-10-CM

## 2017-12-28 MED ORDER — CITALOPRAM HYDROBROMIDE 20 MG PO TABS: 20.0000 mg | ORAL_TABLET | Freq: Every day | ORAL | 1 refills | Status: AC

## 2017-12-28 NOTE — Telephone Encounter (Signed)
Prescription for Celexa 20 mg, 761-month supply sent to her pharmacy. Thanks, BJ

## 2017-12-28 NOTE — Telephone Encounter (Signed)
Refill request for Celexa HBR 20 Mg tablet, take 1 tablet by mouth every day, #90, last refilled 12/01/17

## 2017-12-29 DIAGNOSIS — F419 Anxiety disorder, unspecified: Secondary | ICD-10-CM | POA: Diagnosis not present

## 2017-12-29 DIAGNOSIS — M810 Age-related osteoporosis without current pathological fracture: Secondary | ICD-10-CM | POA: Diagnosis not present

## 2017-12-29 DIAGNOSIS — Z6841 Body Mass Index (BMI) 40.0 and over, adult: Secondary | ICD-10-CM | POA: Diagnosis not present

## 2017-12-29 DIAGNOSIS — I10 Essential (primary) hypertension: Secondary | ICD-10-CM | POA: Diagnosis not present

## 2017-12-29 DIAGNOSIS — M533 Sacrococcygeal disorders, not elsewhere classified: Secondary | ICD-10-CM | POA: Diagnosis not present

## 2017-12-29 NOTE — Telephone Encounter (Signed)
Patient informed that Rx was sent to pharmacy. 

## 2018-01-01 DIAGNOSIS — I10 Essential (primary) hypertension: Secondary | ICD-10-CM | POA: Diagnosis not present

## 2018-01-01 DIAGNOSIS — F419 Anxiety disorder, unspecified: Secondary | ICD-10-CM | POA: Diagnosis not present

## 2018-01-01 DIAGNOSIS — M810 Age-related osteoporosis without current pathological fracture: Secondary | ICD-10-CM | POA: Diagnosis not present

## 2018-01-01 DIAGNOSIS — Z6841 Body Mass Index (BMI) 40.0 and over, adult: Secondary | ICD-10-CM | POA: Diagnosis not present

## 2018-01-01 DIAGNOSIS — M533 Sacrococcygeal disorders, not elsewhere classified: Secondary | ICD-10-CM | POA: Diagnosis not present

## 2018-01-05 DIAGNOSIS — I10 Essential (primary) hypertension: Secondary | ICD-10-CM | POA: Diagnosis not present

## 2018-01-05 DIAGNOSIS — F419 Anxiety disorder, unspecified: Secondary | ICD-10-CM | POA: Diagnosis not present

## 2018-01-05 DIAGNOSIS — Z6841 Body Mass Index (BMI) 40.0 and over, adult: Secondary | ICD-10-CM | POA: Diagnosis not present

## 2018-01-05 DIAGNOSIS — M533 Sacrococcygeal disorders, not elsewhere classified: Secondary | ICD-10-CM | POA: Diagnosis not present

## 2018-01-05 DIAGNOSIS — M810 Age-related osteoporosis without current pathological fracture: Secondary | ICD-10-CM | POA: Diagnosis not present

## 2018-01-06 ENCOUNTER — Telehealth: Payer: Self-pay | Admitting: Family Medicine

## 2018-01-06 NOTE — Telephone Encounter (Signed)
It is okay to give verbal orders to extend PT as requested. Thanks, BJ

## 2018-01-06 NOTE — Telephone Encounter (Signed)
Copied from CRM [id number]. Topic: Quick Communication - See Telephone Encounter >> Jan 06, 2018 10:20 AM Jolayne Hainesaylor, Brittany L wrote: CRM for notification. See Telephone encounter for: 01/06/18.  Kendra physical therapist called from [facility] needs verbals to extended physical therapy for twice a week for two weeks and once a week for two weeks Call back @ [phone]

## 2018-01-06 NOTE — Telephone Encounter (Signed)
Message sent to Dr. Jordan for review and approval. 

## 2018-01-06 NOTE — Telephone Encounter (Signed)
Left message for Enrique SackKendra to give me a call back for verbal orders for PT.

## 2018-01-08 DIAGNOSIS — F419 Anxiety disorder, unspecified: Secondary | ICD-10-CM | POA: Diagnosis not present

## 2018-01-08 DIAGNOSIS — M533 Sacrococcygeal disorders, not elsewhere classified: Secondary | ICD-10-CM | POA: Diagnosis not present

## 2018-01-08 DIAGNOSIS — I10 Essential (primary) hypertension: Secondary | ICD-10-CM | POA: Diagnosis not present

## 2018-01-08 DIAGNOSIS — Z6841 Body Mass Index (BMI) 40.0 and over, adult: Secondary | ICD-10-CM | POA: Diagnosis not present

## 2018-01-08 DIAGNOSIS — M810 Age-related osteoporosis without current pathological fracture: Secondary | ICD-10-CM | POA: Diagnosis not present

## 2018-01-08 NOTE — Telephone Encounter (Signed)
Spoke with Kendra anEnrique Sackd gave verbal orders for PT per Dr. SwazilandJordan.

## 2018-01-11 NOTE — Telephone Encounter (Signed)
Copied from CRM [phone]. Topic: Quick Communication - See Telephone Encounter >> Jan 08, 2018  2:59 PM Elliot GaultBell, Tiffany M wrote: Jethro Bolusaller name: Dois DavenportSandra  [facility]ation [facility] pt[facility]  Call back number: [phone]   Reason for call:  Requesting verbal orders for nurse eval to assess wound on patient leg, nurse would like to make a home visit tomorrow 01/09/18, please advise

## 2018-01-12 ENCOUNTER — Ambulatory Visit: Payer: Medicare Other | Admitting: Family Medicine

## 2018-01-12 ENCOUNTER — Telehealth: Payer: Self-pay | Admitting: Family Medicine

## 2018-01-12 DIAGNOSIS — M810 Age-related osteoporosis without current pathological fracture: Secondary | ICD-10-CM | POA: Diagnosis not present

## 2018-01-12 DIAGNOSIS — F419 Anxiety disorder, unspecified: Secondary | ICD-10-CM | POA: Diagnosis not present

## 2018-01-12 DIAGNOSIS — I10 Essential (primary) hypertension: Secondary | ICD-10-CM | POA: Diagnosis not present

## 2018-01-12 DIAGNOSIS — M533 Sacrococcygeal disorders, not elsewhere classified: Secondary | ICD-10-CM | POA: Diagnosis not present

## 2018-01-12 DIAGNOSIS — Z6841 Body Mass Index (BMI) 40.0 and over, adult: Secondary | ICD-10-CM | POA: Diagnosis not present

## 2018-01-12 NOTE — Telephone Encounter (Signed)
Left a message for Sandra to return my call.

## 2018-01-12 NOTE — Telephone Encounter (Signed)
Jenny DavenportSandra returning call, call back [phone]

## 2018-01-12 NOTE — Telephone Encounter (Signed)
Copied from CRM [id number]. Topic: Inquiry >> Jan 12, 2018  2:31 PM Crist InfanteHarrald, Kathy J wrote: Reason for CRM: Chrissie NoaWilliam,  PT with [facility] calling to advise pt has a blister like that popped on right leg and it is now an open wound.  Pt has appt tomorrow, but he would like to have this addressed at appt.  Nursing has not come out to see pt yet. The changed that has happened since last week, is that pt has bumped her left arm from elbow down and now there is significant swelling.  No pain. Left arm has a general achy soreness.

## 2018-01-13 ENCOUNTER — Encounter: Payer: Self-pay | Admitting: Family Medicine

## 2018-01-13 ENCOUNTER — Other Ambulatory Visit: Payer: Self-pay

## 2018-01-13 ENCOUNTER — Ambulatory Visit (INDEPENDENT_AMBULATORY_CARE_PROVIDER_SITE_OTHER): Payer: Medicare Other | Admitting: Family Medicine

## 2018-01-13 VITALS — BP 130/70 | HR 91 | Temp 98.4°F | Resp 12 | Ht 62.0 in | Wt 231.5 lb

## 2018-01-13 DIAGNOSIS — L97911 Non-pressure chronic ulcer of unspecified part of right lower leg limited to breakdown of skin: Secondary | ICD-10-CM

## 2018-01-13 DIAGNOSIS — R600 Localized edema: Secondary | ICD-10-CM | POA: Diagnosis not present

## 2018-01-13 DIAGNOSIS — R2681 Unsteadiness on feet: Secondary | ICD-10-CM

## 2018-01-13 DIAGNOSIS — F419 Anxiety disorder, unspecified: Secondary | ICD-10-CM | POA: Diagnosis not present

## 2018-01-13 DIAGNOSIS — L03114 Cellulitis of left upper limb: Secondary | ICD-10-CM

## 2018-01-13 DIAGNOSIS — I872 Venous insufficiency (chronic) (peripheral): Secondary | ICD-10-CM | POA: Diagnosis not present

## 2018-01-13 DIAGNOSIS — M7989 Other specified soft tissue disorders: Secondary | ICD-10-CM

## 2018-01-13 MED ORDER — ALUM SULFATE-CA ACETATE EX PACK: 1.0000 | PACK | Freq: Three times a day (TID) | CUTANEOUS | 1 refills | Status: DC

## 2018-01-13 MED ORDER — CEPHALEXIN 500 MG PO CAPS: 500.0000 mg | ORAL_CAPSULE | Freq: Two times a day (BID) | ORAL | 0 refills | Status: AC

## 2018-01-13 NOTE — Assessment & Plan Note (Signed)
We discussed adequate skin care. Recommend increasing dose of furosemide from 20 mg daily to 40 mg daily. Lower extremity elevation above heart level. She will monitor for signs of infection.

## 2018-01-13 NOTE — Progress Notes (Signed)
Right LE

## 2018-01-13 NOTE — Patient Instructions (Addendum)
A few things to remember from today's visit:   Unstable gait  Anxiety disorder, unspecified type  Abnormal liver function test  Cellulitis of left upper extremity - Plan: cephALEXin (KEFLEX) 500 MG capsule   Please be sure medication list is accurate. If a new problem present, please set up appointment sooner than planned today.

## 2018-01-13 NOTE — Assessment & Plan Note (Signed)
Improved. No changes in Celexa 20 mg daily. We discussed some side effects. Follow-up in 3 months.

## 2018-01-13 NOTE — Assessment & Plan Note (Signed)
She feels improvement in balance since PT was started, which she will continue. Fall prevention discussed.

## 2018-01-13 NOTE — Progress Notes (Signed)
HPI:   [name] is a 84 y.o. female, who is here today with her niece for 6 weeks follow up.   She was last seen on 12/01/17  Anxiety was not well controlled on Celexa lower dose,so it was increased from 10 mg to 20 mg. She is also taking Xanax 0.25 mg daily as needed.  Initially she had some diarrhea, which has resolved.  She has not noted major difference but her niece noted that she is going out, she has her hair done,and visiting her sister.  She denies suicidal thoughts.    Unstable gait: PT and OT evaluation were recommended. Mobility has improved. She is having PT 2 times per week.    LUE erythema and edema. Left UE erythema noted by PT yesterday. Mild pain on forearm. No limitation of elbow ROM.  No Hx of trauma.  LE edema getting worse. OTC horse chestnut and LE elevation help. Exacerbated by prolonged standing and worse at the end of the day. Had a blister that burst and left a superficial ulcer on RLE.  + Pretibial erythema,bilateral. No fever,chills,or pain.  No orthopnea or PND.  She is on Furosemide 20 mg daily.    Review of Systems  Constitutional: Positive for fatigue. Negative for activity change and appetite change.  HENT: Negative for mouth sores and sore throat.   Respiratory: Negative for shortness of breath and wheezing.   Cardiovascular: Positive for leg swelling. Negative for chest pain and palpitations.  Gastrointestinal: Negative for abdominal pain, diarrhea, nausea and vomiting.  Genitourinary: Negative for decreased urine volume, dysuria and hematuria.  Musculoskeletal: Positive for gait problem and myalgias.  Skin: Positive for rash and wound.  Neurological: Negative for syncope and headaches.  Psychiatric/Behavioral: Negative for confusion, hallucinations, sleep disturbance and suicidal ideas. The patient is nervous/anxious.      Current Outpatient Medications on File Prior to Visit  Medication Sig Dispense  Refill  . ALPRAZolam (XANAX) 0.25 MG tablet Take 1 tablet (0.25 mg total) by mouth daily as needed for anxiety. 20 tablet 1  . amLODipine-olmesartan (AZOR) 5-40 MG tablet Take 1 tablet daily by mouth. 90 tablet 2  . aspirin 81 MG tablet Take 81 mg by mouth daily.    . bisoprolol-hydrochlorothiazide (ZIAC) 5-6.25 MG tablet Take 1 tablet daily by mouth. 90 tablet 2  . calcium-vitamin D (OSCAL WITH D 500-200) 500-200 MG-UNIT per tablet Take 1 tablet by mouth daily.      . citalopram (CELEXA) 20 MG tablet Take 1 tablet (20 mg total) by mouth daily. 90 tablet 1  . fish oil-omega-3 fatty acids 1000 MG capsule Take 2 g by mouth daily.    . fluticasone (FLONASE) 50 MCG/ACT nasal spray Place 2 sprays into both nostrils daily. 48 g 1  . furosemide (LASIX) 20 MG tablet 2 each am until leg swelling then one daily 60 tablet 11  . Multiple Vitamin (MULTIVITAMIN) tablet Take 1 tablet by mouth daily.      . Vitamin D, Ergocalciferol, (DRISDOL) 50000 units CAPS capsule TAKE 1 CAPSULE EVERY OTHER WEEK 52 capsule 0   No current facility-administered medications on file prior to visit.      Past Medical History:  Diagnosis Date  . Anxiety   . Arthritis   . Hyperlipidemia   . Hypertension   . Osteoporosis    No Known Allergies  Social History   Socioeconomic History  . Marital status: Widowed    Spouse name: Not on file  .  Number of children: Not on file  . Years of education: Not on file  . Highest education level: Not on file  Occupational History  . Not on file  Social Needs  . Financial resource strain: Not on file  . Food insecurity:    Worry: Not on file    Inability: Not on file  . Transportation needs:    Medical: Not on file    Non-medical: Not on file  Tobacco Use  . Smoking status: Never Smoker  . Smokeless tobacco: Never Used  Substance and Sexual Activity  . Alcohol use: No  . Drug use: No  . Sexual activity: Not Currently  Lifestyle  . Physical activity:    Days per  week: Not on file    Minutes per session: Not on file  . Stress: Not on file  Relationships  . Social connections:    Talks on phone: Not on file    Gets together: Not on file    Attends religious service: Not on file    Active member of club or organization: Not on file    Attends meetings of clubs or organizations: Not on file    Relationship status: Not on file  Other Topics Concern  . Not on file  Social History Narrative   Widowed 2012. 2 children (lost 3rd son unknown cause at [id number]). 9 grandchildren. 2 greatgrandchildren.    Lives alone. Does everything for herself except yardwork. 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 Worked at [employer] before that.       Hobbies: day trips with [facility], time with family, enjoys meal gatherings    Vitals:   01/13/18 1350  BP: 130/70  Pulse: 91  Resp: 12  Temp: 98.4 F (36.9 C)  SpO2: 97%   Body mass index is 42.34 kg/m.   Physical Exam  Nursing note and vitals reviewed. Constitutional: She is oriented to person, place, and time. She appears well-developed. No distress.  HENT:  Head: Normocephalic.  Mouth/Throat: Oropharynx is clear and moist and mucous membranes are normal.  Eyes: Pupils are equal, round, and reactive to light. Conjunctivae are normal.  Cardiovascular: Normal rate and regular rhythm.  No murmur heard. DP pulses present bilateral.  Respiratory: Effort normal and breath sounds normal. No respiratory distress.  GI: Soft. There is no tenderness.  Musculoskeletal: She exhibits edema (2+ pitting edema LE, bilateral.  Left forearm and dorsum of hand  pitting 2+ edema) and tenderness.       Legs: LUE: Forearm edema,erythema,and mild pain.  Lymphadenopathy:    She has no cervical adenopathy.  Neurological: She is alert and oriented to person, place, and time. She has normal strength.  Unstable gait.  Skin: Skin is warm. Abrasion noted. There is erythema.    Mild erythema anterior aspect of left forearm. Posterior aspect of left elbow erythematous and peeling. No tenderness or local heat.  Psychiatric: Her mood appears anxious. Cognition and memory are normal. She expresses no suicidal ideation. She expresses no suicidal plans.      ASSESSMENT AND PLAN:   Jenny [name] was seen today for 6 weeks follow-up.   Cellulitis of left upper extremity  Some side effects of abx discussed.  Instructed about warning signs.  - cephALEXin (KEFLEX) 500 MG capsule; Take 1 capsule (500 mg total) by mouth 2 (two) times daily for 7 days.  Dispense: 14 capsule; Refill: 0  Edema of upper extremity  LUE edema and erythema could also be caused by DVT. UE venous Duplex to be arranged. We could not arrange appt for today but for tomorrow at 8 Am. LUE elevation.  Ulcer of lower extremity, right, limited to breakdown of skin (HCC)  Instructed to keep ulcer clean with soap and water. Domeboro compresses may help with drying area. LE elevation.  - aluminum sulfate-calcium acetate (DOMEBORO) packet; Apply 1 packet topically 3 (three) times daily.  Dispense: 100 each; Refill: 1   Venous stasis dermatitis of both lower extremities We discussed adequate skin care. Recommend increasing dose of furosemide from 20 mg daily to 40 mg daily. Lower extremity elevation above heart level. She will monitor for signs of infection.   Anxiety disorder Improved. No changes in Celexa 20 mg daily. We discussed some side effects. Follow-up in 3 months.  Unstable gait She feels improvement in balance since PT was started, which she will continue. Fall prevention discussed.        Betty G. Swaziland, MD  [facility]. [place] office.

## 2018-01-14 ENCOUNTER — Ambulatory Visit (HOSPITAL_COMMUNITY)
Admission: RE | Admit: 2018-01-14 | Discharge: 2018-01-14 | Disposition: A | Discharge status: Home / Self Care | Payer: Medicare Other | Source: Ambulatory Visit | Attending: Cardiovascular Disease | Admitting: Cardiovascular Disease

## 2018-01-14 DIAGNOSIS — M7989 Other specified soft tissue disorders: Secondary | ICD-10-CM | POA: Insufficient documentation

## 2018-01-15 ENCOUNTER — Encounter: Payer: Self-pay | Admitting: Family Medicine

## 2018-01-15 DIAGNOSIS — M533 Sacrococcygeal disorders, not elsewhere classified: Secondary | ICD-10-CM | POA: Diagnosis not present

## 2018-01-15 DIAGNOSIS — M810 Age-related osteoporosis without current pathological fracture: Secondary | ICD-10-CM | POA: Diagnosis not present

## 2018-01-15 DIAGNOSIS — F419 Anxiety disorder, unspecified: Secondary | ICD-10-CM | POA: Diagnosis not present

## 2018-01-15 DIAGNOSIS — I10 Essential (primary) hypertension: Secondary | ICD-10-CM | POA: Diagnosis not present

## 2018-01-15 DIAGNOSIS — Z6841 Body Mass Index (BMI) 40.0 and over, adult: Secondary | ICD-10-CM | POA: Diagnosis not present

## 2018-01-15 NOTE — Telephone Encounter (Signed)
Patient seen on 01/13/18, wound care orders [facility] to [facility] on 01/15/18.

## 2018-01-15 NOTE — Telephone Encounter (Signed)
Spoke with Dois DavenportSandra, gave verbal orders to assess wound on lower right leg per Dr. SwazilandJordan.

## 2018-01-15 NOTE — Telephone Encounter (Signed)
[facility] calling back for orders for wound care.   PLEASE CALL HER TODAY

## 2018-01-17 ENCOUNTER — Encounter: Payer: Self-pay | Admitting: Family Medicine

## 2018-01-18 ENCOUNTER — Telehealth: Payer: Self-pay | Admitting: Family Medicine

## 2018-01-18 NOTE — Telephone Encounter (Signed)
Message sent to Dr. Jordan for review and approval. 

## 2018-01-18 NOTE — Telephone Encounter (Signed)
Copied from CRM [id number]. Topic: Quick Communication - See Telephone Encounter >> Jan 18, 2018  3:06 PM Oneal GroutSebastian, Jennifer S wrote: CRM for notification. See Telephone encounter for: 01/18/18. Requesting verbal orders for 1x a week for wound care, 2PRN visits, requesting una boots

## 2018-01-19 ENCOUNTER — Telehealth: Payer: Self-pay | Admitting: Family Medicine

## 2018-01-19 ENCOUNTER — Other Ambulatory Visit: Payer: Self-pay | Admitting: *Deleted

## 2018-01-19 DIAGNOSIS — M533 Sacrococcygeal disorders, not elsewhere classified: Secondary | ICD-10-CM | POA: Diagnosis not present

## 2018-01-19 DIAGNOSIS — F419 Anxiety disorder, unspecified: Secondary | ICD-10-CM | POA: Diagnosis not present

## 2018-01-19 DIAGNOSIS — I10 Essential (primary) hypertension: Secondary | ICD-10-CM | POA: Diagnosis not present

## 2018-01-19 DIAGNOSIS — J309 Allergic rhinitis, unspecified: Secondary | ICD-10-CM

## 2018-01-19 DIAGNOSIS — Z6841 Body Mass Index (BMI) 40.0 and over, adult: Secondary | ICD-10-CM | POA: Diagnosis not present

## 2018-01-19 DIAGNOSIS — M810 Age-related osteoporosis without current pathological fracture: Secondary | ICD-10-CM | POA: Diagnosis not present

## 2018-01-19 MED ORDER — FLUTICASONE PROPIONATE 50 MCG/ACT NA SUSP: 2.0000 | Freq: Every day | NASAL | 1 refills | Status: AC

## 2018-01-19 NOTE — Telephone Encounter (Signed)
Spoke with Tamika, gave verbal orders per Dr. SwazilandJordan.

## 2018-01-19 NOTE — Telephone Encounter (Unsigned)
Copied from CRM [id number]. Topic: Quick Communication - See Telephone Encounter >> Jan 19, 2018  2:31 PM Raquel SarnaHayes, Teresa G wrote: Wetzel [facility]ornstadWilliam [facility] [phone]   Mon - 01-18-18 nurse was able to see pt for 2 wounds, 1 on each leg. Quickly get orders ok by Dr. SwazilandJordan so pt can continue to be seen by nurse.

## 2018-01-19 NOTE — Telephone Encounter (Signed)
It is okay to proceed with wound care plan as requested. Thanks, BJ

## 2018-01-19 NOTE — Telephone Encounter (Signed)
Left message to return call to clinic for orders.

## 2018-01-19 NOTE — Telephone Encounter (Signed)
[facility] and she stated that Mrs. Ruis has 1 wound on each leg, asking for orders for once a week with 2 prn visits to change [employer] boot. Cb: [phone]

## 2018-01-19 NOTE — Telephone Encounter (Signed)
He is okay to give verbal orders for wound care as requested. Thanks, BJ

## 2018-01-19 NOTE — Telephone Encounter (Signed)
ATC Sandra Tapscott at [facility]is DavenportFarg[facility]umber listed below and number is busy.  Will send to nurse as Lorain ChildesFYI and to follow up

## 2018-01-19 NOTE — Telephone Encounter (Signed)
Chrissie NoaWilliam is calling back and states he is not the person that is supposed to be contact for verbal orders.Marland Kitchen. He is unsure how that works.   He states we are supposed to contact Coralie KeensSandra Tapscott CB# [phone]

## 2018-01-20 NOTE — Telephone Encounter (Signed)
Verbal orders given to Tamika on 01/19/18 per Dr. SwazilandJordan.

## 2018-01-21 DIAGNOSIS — I10 Essential (primary) hypertension: Secondary | ICD-10-CM | POA: Diagnosis not present

## 2018-01-21 DIAGNOSIS — F419 Anxiety disorder, unspecified: Secondary | ICD-10-CM | POA: Diagnosis not present

## 2018-01-21 DIAGNOSIS — M533 Sacrococcygeal disorders, not elsewhere classified: Secondary | ICD-10-CM | POA: Diagnosis not present

## 2018-01-21 DIAGNOSIS — Z6841 Body Mass Index (BMI) 40.0 and over, adult: Secondary | ICD-10-CM | POA: Diagnosis not present

## 2018-01-21 DIAGNOSIS — M810 Age-related osteoporosis without current pathological fracture: Secondary | ICD-10-CM | POA: Diagnosis not present

## 2018-01-25 DIAGNOSIS — Z6841 Body Mass Index (BMI) 40.0 and over, adult: Secondary | ICD-10-CM | POA: Diagnosis not present

## 2018-01-25 DIAGNOSIS — M810 Age-related osteoporosis without current pathological fracture: Secondary | ICD-10-CM | POA: Diagnosis not present

## 2018-01-25 DIAGNOSIS — I10 Essential (primary) hypertension: Secondary | ICD-10-CM | POA: Diagnosis not present

## 2018-01-25 DIAGNOSIS — M533 Sacrococcygeal disorders, not elsewhere classified: Secondary | ICD-10-CM | POA: Diagnosis not present

## 2018-01-25 DIAGNOSIS — F419 Anxiety disorder, unspecified: Secondary | ICD-10-CM | POA: Diagnosis not present

## 2018-01-26 DIAGNOSIS — I10 Essential (primary) hypertension: Secondary | ICD-10-CM | POA: Diagnosis not present

## 2018-01-26 DIAGNOSIS — M810 Age-related osteoporosis without current pathological fracture: Secondary | ICD-10-CM | POA: Diagnosis not present

## 2018-01-26 DIAGNOSIS — Z6841 Body Mass Index (BMI) 40.0 and over, adult: Secondary | ICD-10-CM | POA: Diagnosis not present

## 2018-01-26 DIAGNOSIS — F419 Anxiety disorder, unspecified: Secondary | ICD-10-CM | POA: Diagnosis not present

## 2018-01-26 DIAGNOSIS — M533 Sacrococcygeal disorders, not elsewhere classified: Secondary | ICD-10-CM | POA: Diagnosis not present

## 2018-01-27 DIAGNOSIS — Z6841 Body Mass Index (BMI) 40.0 and over, adult: Secondary | ICD-10-CM | POA: Diagnosis not present

## 2018-01-27 DIAGNOSIS — F419 Anxiety disorder, unspecified: Secondary | ICD-10-CM | POA: Diagnosis not present

## 2018-01-27 DIAGNOSIS — M810 Age-related osteoporosis without current pathological fracture: Secondary | ICD-10-CM | POA: Diagnosis not present

## 2018-01-27 DIAGNOSIS — I10 Essential (primary) hypertension: Secondary | ICD-10-CM | POA: Diagnosis not present

## 2018-01-27 DIAGNOSIS — M533 Sacrococcygeal disorders, not elsewhere classified: Secondary | ICD-10-CM | POA: Diagnosis not present

## 2018-01-29 DIAGNOSIS — M810 Age-related osteoporosis without current pathological fracture: Secondary | ICD-10-CM | POA: Diagnosis not present

## 2018-01-29 DIAGNOSIS — Z6841 Body Mass Index (BMI) 40.0 and over, adult: Secondary | ICD-10-CM | POA: Diagnosis not present

## 2018-01-29 DIAGNOSIS — M533 Sacrococcygeal disorders, not elsewhere classified: Secondary | ICD-10-CM | POA: Diagnosis not present

## 2018-01-29 DIAGNOSIS — F419 Anxiety disorder, unspecified: Secondary | ICD-10-CM | POA: Diagnosis not present

## 2018-01-29 DIAGNOSIS — I10 Essential (primary) hypertension: Secondary | ICD-10-CM | POA: Diagnosis not present

## 2018-02-01 ENCOUNTER — Telehealth: Payer: Self-pay | Admitting: Family Medicine

## 2018-02-01 NOTE — Telephone Encounter (Signed)
Copied from CRM [id number]. Topic: General - Other >> Feb 01, 2018  1:53 PM Tamela OddiHarris, Shawna Kiener J wrote: Reason for CRM: Enrique SackKendra, PT, called to get verbal orders for patient.  2xwk for 3 wks.  CB# [phone]

## 2018-02-01 NOTE — Telephone Encounter (Signed)
Message sent to Dr. Jordan for review and approval. 

## 2018-02-01 NOTE — Telephone Encounter (Signed)
It is okay to give verbal authorization for PT orders as requested.  Thanks, BJ 

## 2018-02-02 DIAGNOSIS — M810 Age-related osteoporosis without current pathological fracture: Secondary | ICD-10-CM | POA: Diagnosis not present

## 2018-02-02 DIAGNOSIS — Z6841 Body Mass Index (BMI) 40.0 and over, adult: Secondary | ICD-10-CM | POA: Diagnosis not present

## 2018-02-02 DIAGNOSIS — I10 Essential (primary) hypertension: Secondary | ICD-10-CM | POA: Diagnosis not present

## 2018-02-02 DIAGNOSIS — F419 Anxiety disorder, unspecified: Secondary | ICD-10-CM | POA: Diagnosis not present

## 2018-02-02 DIAGNOSIS — M533 Sacrococcygeal disorders, not elsewhere classified: Secondary | ICD-10-CM | POA: Diagnosis not present

## 2018-02-02 NOTE — Telephone Encounter (Signed)
Spoke with Enrique SackKendra, gave verbal orders for PT as requested.

## 2018-02-03 DIAGNOSIS — M810 Age-related osteoporosis without current pathological fracture: Secondary | ICD-10-CM | POA: Diagnosis not present

## 2018-02-03 DIAGNOSIS — F419 Anxiety disorder, unspecified: Secondary | ICD-10-CM | POA: Diagnosis not present

## 2018-02-03 DIAGNOSIS — I10 Essential (primary) hypertension: Secondary | ICD-10-CM | POA: Diagnosis not present

## 2018-02-03 DIAGNOSIS — M533 Sacrococcygeal disorders, not elsewhere classified: Secondary | ICD-10-CM | POA: Diagnosis not present

## 2018-02-03 DIAGNOSIS — Z6841 Body Mass Index (BMI) 40.0 and over, adult: Secondary | ICD-10-CM | POA: Diagnosis not present

## 2018-02-05 DIAGNOSIS — F419 Anxiety disorder, unspecified: Secondary | ICD-10-CM | POA: Diagnosis not present

## 2018-02-05 DIAGNOSIS — L97221 Non-pressure chronic ulcer of left calf limited to breakdown of skin: Secondary | ICD-10-CM | POA: Diagnosis not present

## 2018-02-05 DIAGNOSIS — Z9181 History of falling: Secondary | ICD-10-CM | POA: Diagnosis not present

## 2018-02-05 DIAGNOSIS — M533 Sacrococcygeal disorders, not elsewhere classified: Secondary | ICD-10-CM | POA: Diagnosis not present

## 2018-02-05 DIAGNOSIS — M810 Age-related osteoporosis without current pathological fracture: Secondary | ICD-10-CM | POA: Diagnosis not present

## 2018-02-05 DIAGNOSIS — I10 Essential (primary) hypertension: Secondary | ICD-10-CM | POA: Diagnosis not present

## 2018-02-05 DIAGNOSIS — Z6841 Body Mass Index (BMI) 40.0 and over, adult: Secondary | ICD-10-CM | POA: Diagnosis not present

## 2018-02-05 DIAGNOSIS — Z7982 Long term (current) use of aspirin: Secondary | ICD-10-CM | POA: Diagnosis not present

## 2018-02-05 DIAGNOSIS — I872 Venous insufficiency (chronic) (peripheral): Secondary | ICD-10-CM | POA: Diagnosis not present

## 2018-02-05 DIAGNOSIS — L97811 Non-pressure chronic ulcer of other part of right lower leg limited to breakdown of skin: Secondary | ICD-10-CM | POA: Diagnosis not present

## 2018-02-09 ENCOUNTER — Telehealth: Payer: Self-pay | Admitting: Family Medicine

## 2018-02-09 ENCOUNTER — Encounter: Payer: Self-pay | Admitting: Internal Medicine

## 2018-02-09 ENCOUNTER — Telehealth: Payer: Self-pay | Admitting: Internal Medicine

## 2018-02-09 DIAGNOSIS — L97221 Non-pressure chronic ulcer of left calf limited to breakdown of skin: Secondary | ICD-10-CM | POA: Diagnosis not present

## 2018-02-09 DIAGNOSIS — M533 Sacrococcygeal disorders, not elsewhere classified: Secondary | ICD-10-CM | POA: Diagnosis not present

## 2018-02-09 DIAGNOSIS — L97811 Non-pressure chronic ulcer of other part of right lower leg limited to breakdown of skin: Secondary | ICD-10-CM | POA: Diagnosis not present

## 2018-02-09 DIAGNOSIS — I872 Venous insufficiency (chronic) (peripheral): Secondary | ICD-10-CM | POA: Diagnosis not present

## 2018-02-09 DIAGNOSIS — I10 Essential (primary) hypertension: Secondary | ICD-10-CM | POA: Diagnosis not present

## 2018-02-09 DIAGNOSIS — M810 Age-related osteoporosis without current pathological fracture: Secondary | ICD-10-CM | POA: Diagnosis not present

## 2018-02-09 NOTE — Telephone Encounter (Signed)
Jenny [name]- physical therapy: patient is having SOB- patient reports she has had SOB since mid- weekend. BP- 140/90  O2 sat- sitting 88-89 standing 84-85. 20- 30 feet of distance.  Call to office- they were conferenced with Jenny [name] for further instructions.

## 2018-02-09 NOTE — Telephone Encounter (Signed)
Jenny ButteWilliam Carrie is returning call. Cb is [phone]. Mr. Lyla SonCarrie will be in a pt's home till around 5pm.

## 2018-02-09 NOTE — Telephone Encounter (Signed)
Spoke with Jenny [name]. He states that pt has noticeable decrease in activity and ability to ambulate. He was working on transitioning her from walker to cane, but today she has significant setback and he is unable to work on this with her. He states her BP is elevated at 140/90 and her O2 sats are 84-85% with ambulation. He states pt denies any wheeze/difficulty breathing or weight gain.  He assisted with scheduling the pt for OV tomorrow with Kandee Keenory. Pt voiced understanding of appt date and time. Jenny [name] advised pt if she has increased ShOB she will need to contact [facility] for [facility]/ED. Nothing  further needed.   Cory/Dr.Jordan - FYI. Thanks!

## 2018-02-09 NOTE — Telephone Encounter (Signed)
LMTCB for Jenny [name]

## 2018-02-09 NOTE — Telephone Encounter (Signed)
Attempted to call [name] at cell [phone]; office [phone]. I did not receive an answer at time of call. I have left a voicemail message for pt to return call. X1

## 2018-02-10 ENCOUNTER — Other Ambulatory Visit (INDEPENDENT_AMBULATORY_CARE_PROVIDER_SITE_OTHER): Payer: Medicare Other

## 2018-02-10 ENCOUNTER — Ambulatory Visit: Payer: Medicare Other | Admitting: Adult Health

## 2018-02-10 ENCOUNTER — Encounter: Payer: Self-pay | Admitting: Primary Care

## 2018-02-10 ENCOUNTER — Ambulatory Visit (INDEPENDENT_AMBULATORY_CARE_PROVIDER_SITE_OTHER): Payer: Medicare Other | Admitting: Primary Care

## 2018-02-10 ENCOUNTER — Ambulatory Visit (INDEPENDENT_AMBULATORY_CARE_PROVIDER_SITE_OTHER)
Admission: RE | Admit: 2018-02-10 | Discharge: 2018-02-10 | Disposition: A | Discharge status: Home / Self Care | Payer: Medicare Other | Source: Ambulatory Visit | Attending: Primary Care | Admitting: Primary Care

## 2018-02-10 VITALS — BP 122/70 | HR 57 | Ht 62.0 in | Wt 214.0 lb

## 2018-02-10 DIAGNOSIS — R918 Other nonspecific abnormal finding of lung field: Secondary | ICD-10-CM

## 2018-02-10 DIAGNOSIS — R058 Other specified cough: Secondary | ICD-10-CM

## 2018-02-10 DIAGNOSIS — R05 Cough: Secondary | ICD-10-CM

## 2018-02-10 DIAGNOSIS — R0609 Other forms of dyspnea: Secondary | ICD-10-CM

## 2018-02-10 DIAGNOSIS — I499 Cardiac arrhythmia, unspecified: Secondary | ICD-10-CM

## 2018-02-10 DIAGNOSIS — R0602 Shortness of breath: Secondary | ICD-10-CM | POA: Diagnosis not present

## 2018-02-10 LAB — CBC WITH DIFFERENTIAL/PLATELET
Basophils Absolute: 0 10*3/uL (ref 0.0–0.1)
Basophils Relative: 0.4 % (ref 0.0–3.0)
Eosinophils Absolute: 0 10*3/uL (ref 0.0–0.7)
Eosinophils Relative: 0.4 % (ref 0.0–5.0)
HCT: 41 % (ref 36.0–46.0)
Hemoglobin: 13.2 g/dL (ref 12.0–15.0)
LYMPHS ABS: 0.6 10*3/uL — AB (ref 0.7–4.0)
Lymphocytes Relative: 6.7 % — ABNORMAL LOW (ref 12.0–46.0)
MCHC: 32.1 g/dL (ref 30.0–36.0)
MCV: 89.1 fl (ref 78.0–100.0)
MONOS PCT: 11.5 % (ref 3.0–12.0)
Monocytes Absolute: 1 10*3/uL (ref 0.1–1.0)
NEUTROS PCT: 81 % — AB (ref 43.0–77.0)
Neutro Abs: 7.3 10*3/uL (ref 1.4–7.7)
Platelets: 304 10*3/uL (ref 150.0–400.0)
RBC: 4.6 Mil/uL (ref 3.87–5.11)
RDW: 15.8 % — AB (ref 11.5–15.5)
WBC: 9 10*3/uL (ref 4.0–10.5)

## 2018-02-10 LAB — BASIC METABOLIC PANEL
BUN: 26 mg/dL — AB (ref 6–23)
CHLORIDE: 97 meq/L (ref 96–112)
CO2: 29 mEq/L (ref 19–32)
CREATININE: 0.93 mg/dL (ref 0.40–1.20)
Calcium: 9.4 mg/dL (ref 8.4–10.5)
GFR: 61.31 mL/min (ref >60.00)
GLUCOSE: 116 mg/dL — AB (ref 70–99)
POTASSIUM: 4 meq/L (ref 3.5–5.1)
Sodium: 135 mEq/L (ref 135–145)

## 2018-02-10 LAB — TROPONIN I: TNIDX: 0.01 ug/l (ref 0.00–0.06)

## 2018-02-10 NOTE — Telephone Encounter (Signed)
Spoke with William-states patient's O2 is dropping since Saturday-88% RA and walking went down to 84% RA. Increased SOB and increased BP readings. PCP scheduled OV at 2:45pm today. Called patient and stated she could not come in today to be seen but would be able to be seen tomorrow at 11:30am for O2 concerns. Pt will have PCP call our office if she needs to be seen sooner. Nothing more needed at this time.

## 2018-02-10 NOTE — Assessment & Plan Note (Signed)
-   Regularly irregular rhythm, HR 57. Arrhythmia is not new, present in 2013. Patient denies cp or dizziness.

## 2018-02-10 NOTE — Assessment & Plan Note (Signed)
-   Never smoked, low risk

## 2018-02-10 NOTE — Assessment & Plan Note (Signed)
stable °

## 2018-02-10 NOTE — Patient Instructions (Signed)
CXR and labs today- will call tomorrow with results DME for home oxygen  FU in 1 week please with Waynetta SandyBeth, NP

## 2018-02-10 NOTE — Assessment & Plan Note (Signed)
-   Worsened over the last 4-5 days - Qualifies for home oxygen, will set up with DME  - Check CXR -  hx pleural effusion, concern for re-accumlation of fluid - Check labs- monitor kidney function, BNP, cbc  - Continue 40mg  lasix daily  - FU in 1 week

## 2018-02-10 NOTE — Progress Notes (Signed)
@Patient  ID: Jenny [name], female    DOB: 1934/04/24, 84 y.o.   MRN: [id number]  Chief Complaint  Patient presents with  . Follow-up    extrememly breathless with exertion for past 4-5 days, increasingly worse,weakness, leg swelling    Referring provider: [place], Betty G, MD  HPI: 84 year old female, never smoker. Hx UACS, DOE, pulm nodules, bilateral pleural effusion, HTN, allergic rhinitis, anxiety, chronic leg swelling, obesity, irregular heart rate. Patient of Dr. Sherene Sires, last seen on 9/18 and 04/22/17 for consult visit regarding bil R>L pleural effusion and dyspnea. She was treated with lasix, patient was taking 2 tabs daily until leg swelling improved and then daily for maintenance. SOB improved with diuresis.   CT Chest 04/21/17  Solitary borderline enlarged mediastinal lymph node which while nonspecific, in the absence of known malignancy, is favored to be reactive etiology in the setting of a right-sided pleural effusion. Punctate pulmonary nodules, largest of which within the right lower lobe measures 5 mm in diameter. No follow-up needed if patient is low-risk     CT results reviewed with pt by Dr. Sherene Sires >>> Too small for PET or bx, not suspicious enough for excisional bx > really only option for now is follow the Fleischner society guidelines as rec by radiology> she is never smoker,  very low risk   02/10/2018 Patient presents today with her niece with worsening sob for 4-5 days. Oxygen desaturation to 84% RA with ambulation. No significant associated symptoms. Patient has chronic leg swelling, slightly worse. Her lasix was recently increased by PCP to 40mg  daily. Niece reports patient weight loss, but patient states that she has been watching her weight. Using rolling walker. HR is irregular, patient states this is not new. Denies fever, cough, chest pain, abd pain.    No Known Allergies  Immunization History  Administered Date(s) Administered  . Influenza Split 04/14/2012  .  Influenza Whole 04/05/2009, 04/21/2011  . Influenza, High Dose Seasonal PF 03/17/2016  . Influenza,inj,Quad PF,6+ Mos 03/16/2015  . Influenza-Unspecified 04/20/2014, 04/18/2017  . Pneumococcal Conjugate-13 03/16/2015  . Pneumococcal Polysaccharide-23 07/22/2001, 05/24/2012  . Td 07/21/2001  . Tdap 05/24/2012    Past Medical History:  Diagnosis Date  . Anxiety   . Arthritis   . Hyperlipidemia   . Hypertension   . Osteoporosis     Tobacco History: Social History   Tobacco Use  Smoking Status Never Smoker  Smokeless Tobacco Never Used   Counseling given: Not Answered   Outpatient Medications Prior to Visit  Medication Sig Dispense Refill  . ALPRAZolam (XANAX) 0.25 MG tablet Take 1 tablet (0.25 mg total) by mouth daily as needed for anxiety. 20 tablet 1  . amLODipine-olmesartan (AZOR) 5-40 MG tablet Take 1 tablet daily by mouth. 90 tablet 2  . aspirin 81 MG tablet Take 81 mg by mouth daily.    . bisoprolol-hydrochlorothiazide (ZIAC) 5-6.25 MG tablet Take 1 tablet daily by mouth. 90 tablet 2  . citalopram (CELEXA) 20 MG tablet Take 1 tablet (20 mg total) by mouth daily. 90 tablet 1  . fish oil-omega-3 fatty acids 1000 MG capsule Take 2 g by mouth daily.    . fluticasone (FLONASE) 50 MCG/ACT nasal spray Place 2 sprays into both nostrils daily. 48 g 1  . furosemide (LASIX) 20 MG tablet 2 each am until leg swelling then one daily 60 tablet 11  . Multiple Vitamin (MULTIVITAMIN) tablet Take 1 tablet by mouth daily.      . Vitamin D,  Ergocalciferol, (DRISDOL) 50000 units CAPS capsule TAKE 1 CAPSULE EVERY OTHER WEEK 52 capsule 0  . aluminum sulfate-calcium acetate (DOMEBORO) packet Apply 1 packet topically 3 (three) times daily. 100 each 1  . calcium-vitamin D (OSCAL WITH D 500-200) 500-200 MG-UNIT per tablet Take 1 tablet by mouth daily.       No facility-administered medications prior to visit.     Review of Systems  Review of Systems  Constitutional: Negative.   HENT:  Negative.   Respiratory: Positive for shortness of breath. Negative for cough and wheezing.   Cardiovascular: Positive for leg swelling. Negative for chest pain.  Gastrointestinal: Negative.     Physical Exam  BP 122/70 (BP Location: Right Arm, Cuff Size: Normal)   Pulse (!) 57   Ht 5\' 2"  (1.575 m)   Wt 214 lb (97.1 kg)   SpO2 90%   BMI 39.14 kg/m  Physical Exam  Constitutional: She is oriented to person, place, and time. She appears well-developed and well-nourished.  HENT:  Head: Normocephalic and atraumatic.  Eyes: Pupils are equal, round, and reactive to light. EOM are normal.  Neck: Normal range of motion. Neck supple.  Cardiovascular: Normal heart sounds and intact distal pulses.  No murmur heard. Regularly irregular, HR 57. +2-3 BLE edema   Pulmonary/Chest: Effort normal. No stridor. No respiratory distress. She has no wheezes.  LS diminished to right side   Neurological: She is alert and oriented to person, place, and time.  Skin: Skin is warm and dry.  BLE with unna boot secondary to small blister wounds  Psychiatric: She has a normal mood and affect. Her behavior is normal. Judgment and thought content normal.     Lab Results:  CBC    Component Value Date/Time   WBC 9.0 02/10/2018 1628   RBC 4.60 02/10/2018 1628   HGB 13.2 02/10/2018 1628   HCT 41.0 02/10/2018 1628   PLT 304.0 02/10/2018 1628   MCV 89.1 02/10/2018 1628   MCHC 32.1 02/10/2018 1628   RDW 15.8 (H) 02/10/2018 1628   LYMPHSABS 0.6 (L) 02/10/2018 1628   MONOABS 1.0 02/10/2018 1628   EOSABS 0.0 02/10/2018 1628   BASOSABS 0.0 02/10/2018 1628    BMET    Component Value Date/Time   NA 135 02/10/2018 1628   K 4.0 02/10/2018 1628   CL 97 02/10/2018 1628   CO2 29 02/10/2018 1628   GLUCOSE 116 (H) 02/10/2018 1628   BUN 26 (H) 02/10/2018 1628   CREATININE 0.93 02/10/2018 1628   CALCIUM 9.4 02/10/2018 1628   GFRNONAA 82.71 05/28/2010 1405   GFRAA 91 01/25/2008 1338    BNP No results found  for: BNP  ProBNP    Component Value Date/Time   PROBNP 511.0 (H) 04/07/2017 1612    Imaging: Dg Chest 2 View  Result Date: 02/10/2018 CLINICAL DATA:  Shortness of breath for several days EXAM: CHEST - 2 VIEW COMPARISON:  04/21/2017 FINDINGS: Cardiac shadow is enlarged. Aortic calcifications are seen. Large right-sided pleural effusion is noted. Left lung is clear. No acute bony abnormality is noted. IMPRESSION: Large right-sided pleural effusion. Electronically Signed   By: Alcide Clever M.D.   On: 02/10/2018 16:55     Assessment & Plan:  84 year old female, hx bilateral pleural effusion and dyspnea in sept 2018. Improved with diuresis. Presents today with worsening sob x 4-5 days. Slight increase in chronic LE edema. From my understanding, PCP increased lasix to 40mg  earlier this week. Patient's niece reporting oxygen de-sating into  the 80s on RA while ambulating. Concern for re-accumulation of pleural effusion. Will check CXR, BNP, basic labs and d-dimer. Low suspicion for PE. Denies fever, chills, cough, wheeze, chest pain. At this time we will continue 40mg  lasix daily, set up home oxygen with DME company and fu in 1 week. Will call niece to review results in full once resulted on Thursday.    DOE (dyspnea on exertion) - Worsened over the last 4-5 days - Qualifies for home oxygen, will set up with DME  - Check CXR -  hx pleural effusion, concern for re-accumlation of fluid - Check labs- monitor kidney function, BNP, cbc  - Continue 40mg  lasix daily  - FU in 1 week   Irregular heart rate - Regularly irregular rhythm, HR 57. Arrhythmia is not new, present in 2013. Patient denies cp or dizziness.   Upper airway cough syndrome - stable  Multiple pulmonary nodules - Never smoked, low risk      Glenford BayleyElizabeth W Wash Nienhaus, NP 02/10/2018

## 2018-02-11 ENCOUNTER — Telehealth: Payer: Self-pay | Admitting: Primary Care

## 2018-02-11 ENCOUNTER — Ambulatory Visit: Payer: Medicare Other | Admitting: Internal Medicine

## 2018-02-11 ENCOUNTER — Encounter: Payer: Self-pay | Admitting: Primary Care

## 2018-02-11 ENCOUNTER — Other Ambulatory Visit: Payer: Self-pay | Admitting: Internal Medicine

## 2018-02-11 DIAGNOSIS — J90 Pleural effusion, not elsewhere classified: Secondary | ICD-10-CM | POA: Insufficient documentation

## 2018-02-11 DIAGNOSIS — I872 Venous insufficiency (chronic) (peripheral): Secondary | ICD-10-CM | POA: Diagnosis not present

## 2018-02-11 DIAGNOSIS — M533 Sacrococcygeal disorders, not elsewhere classified: Secondary | ICD-10-CM | POA: Diagnosis not present

## 2018-02-11 DIAGNOSIS — I10 Essential (primary) hypertension: Secondary | ICD-10-CM | POA: Diagnosis not present

## 2018-02-11 DIAGNOSIS — M810 Age-related osteoporosis without current pathological fracture: Secondary | ICD-10-CM | POA: Diagnosis not present

## 2018-02-11 DIAGNOSIS — L97811 Non-pressure chronic ulcer of other part of right lower leg limited to breakdown of skin: Secondary | ICD-10-CM | POA: Diagnosis not present

## 2018-02-11 DIAGNOSIS — L97221 Non-pressure chronic ulcer of left calf limited to breakdown of skin: Secondary | ICD-10-CM | POA: Diagnosis not present

## 2018-02-11 LAB — D-DIMER, QUANTITATIVE: D-Dimer, Quant: 1.51 mcg/mL FEU — ABNORMAL HIGH (ref <0.50)

## 2018-02-11 MED ORDER — SPIRONOLACTONE 25 MG PO TABS: 25.0000 mg | ORAL_TABLET | Freq: Every day | ORAL | 2 refills | Status: AC

## 2018-02-11 NOTE — Telephone Encounter (Signed)
Barbara CowerJason called back and he is aware that the patient care coordination note will be updated and he will check back on this today.  Nothing further is needed.

## 2018-02-11 NOTE — Progress Notes (Unsigned)
Reviewed cxr > needs dx and therapeutic R thoracentesis

## 2018-02-11 NOTE — Telephone Encounter (Signed)
Attempted to call pt's niece Gayleen OremMary Elizabeth Moore as she had sent a message through pt's Mychart to see if pt's appt needed to be cancelled but when called Corrie DandyMary, unable to speak with her.  Left a detailed message stating that we were going to cancel pt's upcoming appt due to MW wanting her to be seen after the thoracentesis but also stated to [facility] to call our office so that way we could go over the new med MW wants to start her on.  Am also going to send a message through pt's mychart to have her call the office.  [facility] first available appt is not until 8/21. Dr. Sherene SiresWert, please advise if 8/21 would be okay for us to reschedule pt's appt with you as she is having thoracentesis 8/1. If pt needs to be seen sooner than 8/21, please advise when we can work pt in to your schedule?

## 2018-02-11 NOTE — Telephone Encounter (Signed)
She should get lots of benefit from the procedure so let her know if she doesn't improve we can move her up and I will update her in the meantime with results as they come in

## 2018-02-11 NOTE — Telephone Encounter (Signed)
Attempted to call [name] but unable to reach her so called and spoke with pt letting her know that we had been trying to reach [facility] but due to unable to reach her we decided to call  Her directly.  Stated to pt that we were going to cancel OV with MW Monday, 7/29 due to MW wanting to see pt after thoracentesis. First avail appt with MW was not until 8/21 but stated to pt if she needed to be seen sooner than 8/21 if she was no better once thoracentesis was performed to call our office and we could possibly move up the appt. Also stated to her once we had the results back from the thoracentesis we would call her with the results.  Pt expressed understanding. Stated to her that we were going to send a new med to her preferred pharmacy of spironolactone. Pt expressed understanding. Verified pt's pharmacy and saw that Buelah ManisBeth Walsh, NP already sent script in for pt. Nothing further needed.

## 2018-02-11 NOTE — Telephone Encounter (Signed)
Spoke with Dr. Sherene SiresWert, because lasix was increased a few months ago and she has a new right pleural effusion he would like her to start another medication called Aldactone (25mg  daily). He wants to see her after thoracentesis. She can cancel apt on Monday. Thanks

## 2018-02-11 NOTE — Progress Notes (Signed)
Chart and office note reviewed in detail along with available xrays/ labs > agree with a/p as outlined but will likely need R dx and therapeutic thoracentesis

## 2018-02-11 NOTE — Telephone Encounter (Signed)
lmomtcb x 1 for [facility]Jason with [facility] about this message

## 2018-02-15 ENCOUNTER — Encounter: Payer: Self-pay | Admitting: Primary Care

## 2018-02-15 ENCOUNTER — Ambulatory Visit: Payer: Medicare Other | Admitting: Internal Medicine

## 2018-02-15 DIAGNOSIS — I10 Essential (primary) hypertension: Secondary | ICD-10-CM | POA: Diagnosis not present

## 2018-02-15 DIAGNOSIS — L97221 Non-pressure chronic ulcer of left calf limited to breakdown of skin: Secondary | ICD-10-CM | POA: Diagnosis not present

## 2018-02-15 DIAGNOSIS — M810 Age-related osteoporosis without current pathological fracture: Secondary | ICD-10-CM | POA: Diagnosis not present

## 2018-02-15 DIAGNOSIS — L97811 Non-pressure chronic ulcer of other part of right lower leg limited to breakdown of skin: Secondary | ICD-10-CM | POA: Diagnosis not present

## 2018-02-15 DIAGNOSIS — I872 Venous insufficiency (chronic) (peripheral): Secondary | ICD-10-CM | POA: Diagnosis not present

## 2018-02-15 DIAGNOSIS — M533 Sacrococcygeal disorders, not elsewhere classified: Secondary | ICD-10-CM | POA: Diagnosis not present

## 2018-02-16 ENCOUNTER — Encounter: Payer: Self-pay | Admitting: Internal Medicine

## 2018-02-17 ENCOUNTER — Other Ambulatory Visit: Payer: Self-pay | Admitting: Family Medicine

## 2018-02-17 ENCOUNTER — Telehealth: Payer: Self-pay | Admitting: Internal Medicine

## 2018-02-17 DIAGNOSIS — I10 Essential (primary) hypertension: Secondary | ICD-10-CM | POA: Diagnosis not present

## 2018-02-17 DIAGNOSIS — L97221 Non-pressure chronic ulcer of left calf limited to breakdown of skin: Secondary | ICD-10-CM | POA: Diagnosis not present

## 2018-02-17 DIAGNOSIS — M533 Sacrococcygeal disorders, not elsewhere classified: Secondary | ICD-10-CM | POA: Diagnosis not present

## 2018-02-17 DIAGNOSIS — I872 Venous insufficiency (chronic) (peripheral): Secondary | ICD-10-CM | POA: Diagnosis not present

## 2018-02-17 DIAGNOSIS — M810 Age-related osteoporosis without current pathological fracture: Secondary | ICD-10-CM | POA: Diagnosis not present

## 2018-02-17 DIAGNOSIS — L97811 Non-pressure chronic ulcer of other part of right lower leg limited to breakdown of skin: Secondary | ICD-10-CM | POA: Diagnosis not present

## 2018-02-17 DIAGNOSIS — F419 Anxiety disorder, unspecified: Secondary | ICD-10-CM

## 2018-02-17 NOTE — Telephone Encounter (Signed)
Spoke with [place]ittany with [facility]. She is aware of Dr. Thurston HoleWert's response. Nothing further was needed.

## 2018-02-17 NOTE — Telephone Encounter (Signed)
MW please advise.  Thanks.  

## 2018-02-17 NOTE — Telephone Encounter (Signed)
Goal is to keep 02 sat > 90% pending the thoracentesis which needs to be completed as scheduled 02/18/18  and will likely improved breathing/ sats - in meantime sitting up or even sleeping in recliner may be needed.  Temp issue is not important for now and just needs to be monitored periodically unless shaking chills develop with it in which case needs to go to ER

## 2018-02-17 NOTE — Telephone Encounter (Signed)
Attempted to call GrenadaBrittany with [facility] at [phone] I did not receive an answer at time of call. I have left a voicemail message for pt to return call. X1  [place]Brittany with [facility] states pt is sating 85-87% on 2L of O2 at rest.  Time of message pt is bumped to 3.5L sats improved Pt has temp of 99.64F, has a bronch scheduled tomorrow 02/18/18  MW please advise

## 2018-02-18 ENCOUNTER — Ambulatory Visit (HOSPITAL_COMMUNITY)
Admission: RE | Admit: 2018-02-18 | Discharge: 2018-02-18 | Disposition: A | Discharge status: Home / Self Care | Payer: Medicare Other | Source: Ambulatory Visit | Attending: Student | Admitting: Student

## 2018-02-18 ENCOUNTER — Ambulatory Visit (HOSPITAL_COMMUNITY)
Admission: RE | Admit: 2018-02-18 | Discharge: 2018-02-18 | Disposition: A | Discharge status: Home / Self Care | Payer: Medicare Other | Source: Ambulatory Visit | Attending: Internal Medicine | Admitting: Internal Medicine

## 2018-02-18 DIAGNOSIS — R846 Abnormal cytological findings in specimens from respiratory organs and thorax: Secondary | ICD-10-CM | POA: Diagnosis not present

## 2018-02-18 DIAGNOSIS — Z9889 Other specified postprocedural states: Secondary | ICD-10-CM

## 2018-02-18 DIAGNOSIS — J90 Pleural effusion, not elsewhere classified: Secondary | ICD-10-CM | POA: Diagnosis not present

## 2018-02-18 LAB — BODY FLUID CELL COUNT WITH DIFFERENTIAL
Lymphs, Fluid: 23 %
Monocyte-Macrophage-Serous Fluid: 65 % (ref 50–90)
Neutrophil Count, Fluid: 12 % (ref 0–25)
WBC FLUID: 599 uL (ref 0–1000)

## 2018-02-18 LAB — PROTEIN, PLEURAL OR PERITONEAL FLUID: TOTAL PROTEIN, FLUID: 3.5 g/dL

## 2018-02-18 LAB — ALBUMIN, PLEURAL OR PERITONEAL FLUID: ALBUMIN FL: 2 g/dL

## 2018-02-18 LAB — LACTATE DEHYDROGENASE, PLEURAL OR PERITONEAL FLUID: LD, Fluid: 75 U/L — ABNORMAL HIGH (ref 3–23)

## 2018-02-18 LAB — GLUCOSE, PLEURAL OR PERITONEAL FLUID: Glucose, Fluid: 97 mg/dL

## 2018-02-18 MED ORDER — LIDOCAINE HCL 1 % IJ SOLN
INTRAMUSCULAR | Status: AC
  Filled 2018-02-18: qty 10

## 2018-02-18 NOTE — Procedures (Signed)
PROCEDURE SUMMARY:  Successful image-guided right thoracentesis. Yielded 2 liters of clear gold fluid. Patient tolerated procedure well. No immediate complications.  Specimen was sent for labs. CXR ordered.  Gordy Councilmanlexandra Bronda Alfred PA-C 02/18/2018 11:23 AM

## 2018-02-22 DIAGNOSIS — L97221 Non-pressure chronic ulcer of left calf limited to breakdown of skin: Secondary | ICD-10-CM | POA: Diagnosis not present

## 2018-02-22 DIAGNOSIS — L97811 Non-pressure chronic ulcer of other part of right lower leg limited to breakdown of skin: Secondary | ICD-10-CM | POA: Diagnosis not present

## 2018-02-22 DIAGNOSIS — I10 Essential (primary) hypertension: Secondary | ICD-10-CM | POA: Diagnosis not present

## 2018-02-22 DIAGNOSIS — M810 Age-related osteoporosis without current pathological fracture: Secondary | ICD-10-CM | POA: Diagnosis not present

## 2018-02-22 DIAGNOSIS — I872 Venous insufficiency (chronic) (peripheral): Secondary | ICD-10-CM | POA: Diagnosis not present

## 2018-02-22 DIAGNOSIS — M533 Sacrococcygeal disorders, not elsewhere classified: Secondary | ICD-10-CM | POA: Diagnosis not present

## 2018-02-22 NOTE — Progress Notes (Signed)
Spoke with Corrie DandyMary, pt's niece and notified of results and she verbalized understanding

## 2018-02-24 ENCOUNTER — Telehealth: Payer: Self-pay | Admitting: Internal Medicine

## 2018-02-24 DIAGNOSIS — M810 Age-related osteoporosis without current pathological fracture: Secondary | ICD-10-CM | POA: Diagnosis not present

## 2018-02-24 DIAGNOSIS — L97811 Non-pressure chronic ulcer of other part of right lower leg limited to breakdown of skin: Secondary | ICD-10-CM | POA: Diagnosis not present

## 2018-02-24 DIAGNOSIS — M533 Sacrococcygeal disorders, not elsewhere classified: Secondary | ICD-10-CM | POA: Diagnosis not present

## 2018-02-24 DIAGNOSIS — L97221 Non-pressure chronic ulcer of left calf limited to breakdown of skin: Secondary | ICD-10-CM | POA: Diagnosis not present

## 2018-02-24 DIAGNOSIS — I10 Essential (primary) hypertension: Secondary | ICD-10-CM | POA: Diagnosis not present

## 2018-02-24 DIAGNOSIS — I872 Venous insufficiency (chronic) (peripheral): Secondary | ICD-10-CM | POA: Diagnosis not present

## 2018-02-24 NOTE — Telephone Encounter (Signed)
Spoke with [place]Brittany. She stated that the patient has only urinated twice today. At the time of the first call, she had only taken 20mg  of Lasix. When I called GrenadaBrittany back, she had taken a second dose of Lasix. Per [place]Brittany, patient's legs are still swollen and patient is having some increased SOB. Patient is on 2.5L of O2 and O2 was at 96%.   [facility] and was advised to follow up after her thoracentesis. This appt was not made. She has been scheduled with MW for 02/25/18 at 3pm. Beth verbalized understanding.   Nothing further needed at time of call.

## 2018-02-24 NOTE — Telephone Encounter (Signed)
Jenny [name], [facility], states that the pt has diminished lung sounds on right side lung. Also states pt has only urinated twice today but had only taken 20mg  of the normal 40mg  of lasix. Cb is [phone].

## 2018-02-25 ENCOUNTER — Telehealth: Payer: Self-pay | Admitting: Family Medicine

## 2018-02-25 ENCOUNTER — Ambulatory Visit: Payer: Medicare Other | Admitting: Internal Medicine

## 2018-02-25 NOTE — Telephone Encounter (Signed)
See note to extend PT.

## 2018-02-25 NOTE — Telephone Encounter (Signed)
I spoke with pt to get more information about the swelling in legs & decreased urine output. Pt stated that she had missed a 1 day dose of Lasix. She has taken today, the swelling is going down and she is urinating plenty right now. She has a follow up scheduled with pulmonary this month. If swelling does not continue to go down then she will contact Dr. SwazilandJordan.

## 2018-02-25 NOTE — Telephone Encounter (Signed)
Copied from CRM [id number]. Topic: Quick Communication - See Telephone Encounter >> Feb 25, 2018  1:25 PM Burchel, Abbi R wrote: CRM for notification. See Telephone encounter for: 02/25/18.  Kendra requesting v/o to extend PT 2*6wk.    Enrique SackKendra states pt was sent to pulmonologist today, for SOB, increased swelling in legs and decreased urine output.    Cb: [phone]

## 2018-02-26 NOTE — Telephone Encounter (Signed)
Please inquire about her shortness of breath and swelling. It is okay to give verbal authorization for PT as requested.  Thanks, BJ

## 2018-02-26 NOTE — Telephone Encounter (Signed)
Message sent to Dr. Jordan for review. 

## 2018-02-26 NOTE — Telephone Encounter (Signed)
Spoke with Enrique SackKendra, gave verbal orders to extend PT as requested. Spoke with patient's daughter and she stated that patient is doing much better with breathing and swelling and she using the bathroom as she should.

## 2018-02-27 ENCOUNTER — Other Ambulatory Visit: Payer: Self-pay | Admitting: Internal Medicine

## 2018-02-27 ENCOUNTER — Other Ambulatory Visit: Payer: Self-pay | Admitting: Family Medicine

## 2018-02-27 DIAGNOSIS — J90 Pleural effusion, not elsewhere classified: Secondary | ICD-10-CM

## 2018-02-27 DIAGNOSIS — I10 Essential (primary) hypertension: Secondary | ICD-10-CM

## 2018-03-03 ENCOUNTER — Telehealth: Payer: Self-pay | Admitting: Family Medicine

## 2018-03-03 DIAGNOSIS — I872 Venous insufficiency (chronic) (peripheral): Secondary | ICD-10-CM | POA: Diagnosis not present

## 2018-03-03 DIAGNOSIS — M810 Age-related osteoporosis without current pathological fracture: Secondary | ICD-10-CM | POA: Diagnosis not present

## 2018-03-03 DIAGNOSIS — L97221 Non-pressure chronic ulcer of left calf limited to breakdown of skin: Secondary | ICD-10-CM | POA: Diagnosis not present

## 2018-03-03 DIAGNOSIS — I10 Essential (primary) hypertension: Secondary | ICD-10-CM | POA: Diagnosis not present

## 2018-03-03 DIAGNOSIS — M533 Sacrococcygeal disorders, not elsewhere classified: Secondary | ICD-10-CM | POA: Diagnosis not present

## 2018-03-03 DIAGNOSIS — L97811 Non-pressure chronic ulcer of other part of right lower leg limited to breakdown of skin: Secondary | ICD-10-CM | POA: Diagnosis not present

## 2018-03-03 NOTE — Telephone Encounter (Signed)
Message sent to Dr. Jordan for review and approval. 

## 2018-03-03 NOTE — Telephone Encounter (Signed)
Copied from CRM [id number]. Topic: Quick Communication - See Telephone Encounter >> Mar 03, 2018  2:51 PM Lorayne BenderAlexander, Amber L wrote: CRM for notification. See Telephone encounter for: 03/03/18.  GrenadaBrittany from [facility] calling because they have been doing unna boot for wound care.  GrenadaBrittany reports that wounds have healed but she still has pitting adema.  GrenadaBrittany needs to know if the unna boot should be continued or if there are new orders. Pt has a rash under her abdominal fold.  GrenadaBrittany wants to know if a RX can be called in for Nystatin cream or powder.  Pt uses [id number] - [place], [place] - [address] [phone] (Phone) [phone] (Fax).  GrenadaBrittany can be reached at [phone]

## 2018-03-04 ENCOUNTER — Telehealth: Payer: Self-pay | Admitting: Family Medicine

## 2018-03-04 NOTE — Telephone Encounter (Signed)
Copied from CRM [id number]. Topic: General - Other >> Mar 04, 2018  8:52 AM Stephannie LiSimmons, Trevante Tennell L, NT wrote:  Reason for CRM: Kathalene FramesKenrda from [facility] called and needs a verbal order  for occupational therapy to help her learn how  to bathe , and an order for a social worker  for a home health aide , and also a interim home health aide from [facility] until then ,and also she missed her pulmonology appointment it has been rescheduled for   8/21/ 19  ,ok to leave a message  [phone]

## 2018-03-04 NOTE — Telephone Encounter (Signed)
Okay for referral? Please advise   

## 2018-03-05 ENCOUNTER — Ambulatory Visit: Payer: Self-pay | Admitting: *Deleted

## 2018-03-05 ENCOUNTER — Other Ambulatory Visit: Payer: Self-pay | Admitting: Family Medicine

## 2018-03-05 DIAGNOSIS — M533 Sacrococcygeal disorders, not elsewhere classified: Secondary | ICD-10-CM | POA: Diagnosis not present

## 2018-03-05 DIAGNOSIS — L304 Erythema intertrigo: Secondary | ICD-10-CM

## 2018-03-05 DIAGNOSIS — I872 Venous insufficiency (chronic) (peripheral): Secondary | ICD-10-CM | POA: Diagnosis not present

## 2018-03-05 DIAGNOSIS — M810 Age-related osteoporosis without current pathological fracture: Secondary | ICD-10-CM | POA: Diagnosis not present

## 2018-03-05 DIAGNOSIS — L97221 Non-pressure chronic ulcer of left calf limited to breakdown of skin: Secondary | ICD-10-CM | POA: Diagnosis not present

## 2018-03-05 DIAGNOSIS — I10 Essential (primary) hypertension: Secondary | ICD-10-CM | POA: Diagnosis not present

## 2018-03-05 DIAGNOSIS — L97811 Non-pressure chronic ulcer of other part of right lower leg limited to breakdown of skin: Secondary | ICD-10-CM | POA: Diagnosis not present

## 2018-03-05 MED ORDER — NYSTATIN 100000 UNIT/GM EX POWD: Freq: Four times a day (QID) | CUTANEOUS | 3 refills | Status: AC

## 2018-03-05 NOTE — Telephone Encounter (Signed)
Verbal orders given to [facility] via vm. No further action needed!

## 2018-03-05 NOTE — Telephone Encounter (Addendum)
She has history of lower extremity edema with venous stasis dermatitis. If venous ulcer have healed, I do not think Unna boot needs to be continued.  Compression stockings and/or lower extremity elevation above waist level may help to control swelling.  Rx for Nystatin powder sent. Use antibacterial soap to keep area of rash clean. Avoid moisture.    Thanks, BJ

## 2018-03-05 NOTE — Telephone Encounter (Signed)
It is okay to give verbal authorization for OT and social worker evaluation as requested.  Thanks, BJ

## 2018-03-05 NOTE — Telephone Encounter (Signed)
She is taking several diuretics, most likely trying to treat lower extremity edema and HTN.  HCTZ and Lasix can decrease potassium but spironolactone increases potassium. She is on Olmesartan which can also increase potassium.  So in general she seems to be on a combination of medications that counteract one to another in regard to their effects on K+. Last BMP was done in 09/2017,K+ was 3.7.  Because all these medication, it is important to continue monitoring renal function as well as potassium levels. We can recheck both next office visit.  Thanks, BJ

## 2018-03-05 NOTE — Telephone Encounter (Signed)
Pt and niece called to go over her med list. Niece (given verbal permission by patient) was organizing the patient's medication. She found that she had meds prescribed by pulmonology that was not on the list from Dr. SwazilandJordan from the previous encounter at the office. The niece was concerned that she was on several diuretics, spironolactone 25 mg tab, (prescribed on 02/11/18),  bisoprolol-hydrochlorothiazide 5-6.25 mg tab and furosemide 20 mg tab (taking 40mg  daily). She is afraid that she may be losing too much potassium. Advised her to notify pulmonary and we would [facility]. Niece voiced understanding. Went over each of these meds with the niece. She knows also that they should be helping her b/p.

## 2018-03-05 NOTE — Telephone Encounter (Signed)
I left a message for the pts niece Corrie DandyMary to return my call.  CRM also created.

## 2018-03-05 NOTE — Telephone Encounter (Signed)
Left detailed message informing pt of update. 

## 2018-03-08 ENCOUNTER — Telehealth: Payer: Self-pay | Admitting: Internal Medicine

## 2018-03-08 DIAGNOSIS — I10 Essential (primary) hypertension: Secondary | ICD-10-CM | POA: Diagnosis not present

## 2018-03-08 DIAGNOSIS — M810 Age-related osteoporosis without current pathological fracture: Secondary | ICD-10-CM | POA: Diagnosis not present

## 2018-03-08 DIAGNOSIS — L97811 Non-pressure chronic ulcer of other part of right lower leg limited to breakdown of skin: Secondary | ICD-10-CM | POA: Diagnosis not present

## 2018-03-08 DIAGNOSIS — M533 Sacrococcygeal disorders, not elsewhere classified: Secondary | ICD-10-CM | POA: Diagnosis not present

## 2018-03-08 DIAGNOSIS — L97221 Non-pressure chronic ulcer of left calf limited to breakdown of skin: Secondary | ICD-10-CM | POA: Diagnosis not present

## 2018-03-08 DIAGNOSIS — I872 Venous insufficiency (chronic) (peripheral): Secondary | ICD-10-CM | POA: Diagnosis not present

## 2018-03-08 NOTE — Telephone Encounter (Signed)
Forwarding to MW as Lorain ChildesFYI.  Please advise if anything further is needed.

## 2018-03-08 NOTE — Telephone Encounter (Signed)
Tried calling Alvino ChapelJo, unable to leave message, mailbox full, will try again later.

## 2018-03-09 DIAGNOSIS — L97221 Non-pressure chronic ulcer of left calf limited to breakdown of skin: Secondary | ICD-10-CM | POA: Diagnosis not present

## 2018-03-09 DIAGNOSIS — L97811 Non-pressure chronic ulcer of other part of right lower leg limited to breakdown of skin: Secondary | ICD-10-CM | POA: Diagnosis not present

## 2018-03-09 DIAGNOSIS — M810 Age-related osteoporosis without current pathological fracture: Secondary | ICD-10-CM | POA: Diagnosis not present

## 2018-03-09 DIAGNOSIS — I872 Venous insufficiency (chronic) (peripheral): Secondary | ICD-10-CM | POA: Diagnosis not present

## 2018-03-09 DIAGNOSIS — M533 Sacrococcygeal disorders, not elsewhere classified: Secondary | ICD-10-CM | POA: Diagnosis not present

## 2018-03-09 DIAGNOSIS — I10 Essential (primary) hypertension: Secondary | ICD-10-CM | POA: Diagnosis not present

## 2018-03-09 NOTE — Telephone Encounter (Signed)
Tried calling Alvino ChapelJo, no answer, unable to leave message, mailbox full.

## 2018-03-10 ENCOUNTER — Ambulatory Visit (INDEPENDENT_AMBULATORY_CARE_PROVIDER_SITE_OTHER)
Admission: RE | Admit: 2018-03-10 | Discharge: 2018-03-10 | Disposition: A | Discharge status: Home / Self Care | Payer: Medicare Other | Source: Ambulatory Visit | Attending: Internal Medicine | Admitting: Internal Medicine

## 2018-03-10 ENCOUNTER — Encounter: Payer: Self-pay | Admitting: Internal Medicine

## 2018-03-10 ENCOUNTER — Other Ambulatory Visit (INDEPENDENT_AMBULATORY_CARE_PROVIDER_SITE_OTHER): Payer: Medicare Other

## 2018-03-10 ENCOUNTER — Ambulatory Visit (INDEPENDENT_AMBULATORY_CARE_PROVIDER_SITE_OTHER): Payer: Medicare Other | Admitting: Internal Medicine

## 2018-03-10 VITALS — BP 116/64 | Ht 62.0 in | Wt 197.4 lb

## 2018-03-10 DIAGNOSIS — M533 Sacrococcygeal disorders, not elsewhere classified: Secondary | ICD-10-CM | POA: Diagnosis not present

## 2018-03-10 DIAGNOSIS — R7989 Other specified abnormal findings of blood chemistry: Secondary | ICD-10-CM

## 2018-03-10 DIAGNOSIS — J90 Pleural effusion, not elsewhere classified: Secondary | ICD-10-CM | POA: Diagnosis not present

## 2018-03-10 DIAGNOSIS — J9612 Chronic respiratory failure with hypercapnia: Secondary | ICD-10-CM | POA: Diagnosis not present

## 2018-03-10 DIAGNOSIS — I10 Essential (primary) hypertension: Secondary | ICD-10-CM | POA: Diagnosis not present

## 2018-03-10 DIAGNOSIS — R0609 Other forms of dyspnea: Secondary | ICD-10-CM

## 2018-03-10 DIAGNOSIS — J9611 Chronic respiratory failure with hypoxia: Secondary | ICD-10-CM

## 2018-03-10 DIAGNOSIS — I872 Venous insufficiency (chronic) (peripheral): Secondary | ICD-10-CM | POA: Diagnosis not present

## 2018-03-10 DIAGNOSIS — M810 Age-related osteoporosis without current pathological fracture: Secondary | ICD-10-CM | POA: Diagnosis not present

## 2018-03-10 DIAGNOSIS — L97811 Non-pressure chronic ulcer of other part of right lower leg limited to breakdown of skin: Secondary | ICD-10-CM | POA: Diagnosis not present

## 2018-03-10 DIAGNOSIS — L97221 Non-pressure chronic ulcer of left calf limited to breakdown of skin: Secondary | ICD-10-CM | POA: Diagnosis not present

## 2018-03-10 LAB — CBC WITH DIFFERENTIAL/PLATELET
BASOS ABS: 0.1 10*3/uL (ref 0.0–0.1)
Basophils Relative: 0.9 % (ref 0.0–3.0)
Eosinophils Absolute: 0.3 10*3/uL (ref 0.0–0.7)
Eosinophils Relative: 3.7 % (ref 0.0–5.0)
HEMATOCRIT: 38.9 % (ref 36.0–46.0)
Hemoglobin: 12.5 g/dL (ref 12.0–15.0)
LYMPHS ABS: 0.7 10*3/uL (ref 0.7–4.0)
LYMPHS PCT: 9.4 % — AB (ref 12.0–46.0)
MCHC: 32.2 g/dL (ref 30.0–36.0)
MCV: 90.4 fl (ref 78.0–100.0)
Monocytes Absolute: 0.9 10*3/uL (ref 0.1–1.0)
Monocytes Relative: 12.1 % — ABNORMAL HIGH (ref 3.0–12.0)
NEUTROS ABS: 5.4 10*3/uL (ref 1.4–7.7)
NEUTROS PCT: 73.9 % (ref 43.0–77.0)
PLATELETS: 316 10*3/uL (ref 150.0–400.0)
RBC: 4.3 Mil/uL (ref 3.87–5.11)
RDW: 16.6 % — ABNORMAL HIGH (ref 11.5–15.5)
WBC: 7.3 10*3/uL (ref 4.0–10.5)

## 2018-03-10 LAB — BASIC METABOLIC PANEL
BUN: 48 mg/dL — ABNORMAL HIGH (ref 6–23)
CALCIUM: 9.8 mg/dL (ref 8.4–10.5)
CO2: 35 mEq/L — ABNORMAL HIGH (ref 19–32)
Chloride: 97 mEq/L (ref 96–112)
Creatinine, Ser: 1.31 mg/dL — ABNORMAL HIGH (ref 0.40–1.20)
GFR: 41.28 mL/min — AB (ref >60.00)
GLUCOSE: 77 mg/dL (ref 70–99)
Potassium: 4.7 mEq/L (ref 3.5–5.1)
SODIUM: 140 meq/L (ref 135–145)

## 2018-03-10 LAB — HEPATIC FUNCTION PANEL
ALK PHOS: 60 U/L (ref 39–117)
ALT: 17 U/L (ref 0–35)
AST: 27 U/L (ref 0–37)
Albumin: 3.2 g/dL — ABNORMAL LOW (ref 3.5–5.2)
BILIRUBIN DIRECT: 0.5 mg/dL — AB (ref 0.0–0.3)
Total Bilirubin: 1.2 mg/dL (ref 0.2–1.2)
Total Protein: 6.9 g/dL (ref 6.0–8.3)

## 2018-03-10 LAB — SEDIMENTATION RATE: Sed Rate: 34 mm/hr — ABNORMAL HIGH (ref 0–30)

## 2018-03-10 LAB — TSH: TSH: 3.13 u[IU]/mL (ref 0.35–4.50)

## 2018-03-10 NOTE — Telephone Encounter (Signed)
We will discuss these concerns next month,when she has a f/U appt.  Betty SwazilandJordan, MD

## 2018-03-10 NOTE — Telephone Encounter (Signed)
Per pt niece, Jenny [name], pt has been having trouble keeping her SpO2 above 90% while sitting on 2.5L of O2. Pt has had poor urinary output and is having general weakness. Pt has also been having decreased appetite. Jenny [name] would like to give FYI to MW-pt has apt today 8/21. Cb is [phone].

## 2018-03-10 NOTE — Telephone Encounter (Signed)
Will route to MW for FYI.

## 2018-03-10 NOTE — Patient Instructions (Addendum)
Please see patient coordinator before you leave today  to schedule echocardiogram   Please remember to go to the lab and x-ray department downstairs in the basement  for your tests - we will call you with the results when they are available.  See Tammy NP 4  weeks with all your medications, even over the counter meds, separated in two separate bags, the ones you take no matter what vs the ones you stop once you feel better and take only as needed when you feel you need them.   Tammy  will generate for you a new user friendly medication calendar that will put us all on the same page re: your medication use.  - add d/c nsaids - retap R asap

## 2018-03-10 NOTE — Telephone Encounter (Signed)
Aware/ to see 03/10/2018

## 2018-03-10 NOTE — Progress Notes (Signed)
Subjective:     Patient ID: Jenny [name], female   DOB: 1933/10/12,    MRN: [id number]    Brief patient profile:  58 yowf never smoker with tendency to seasonal stuffy/cough  spring > fall rx with otc x around 2008 but more persistent since May 2018 > dry cough assoc with sob/ leg swelling eval with cxr 03/03/17 with bilateral R> L effusion and no change on 03/27/17 p zpak so referred to pulmonary clinic 04/07/2017 by Dr   Durene [name]   History of Present Illness  04/07/2017 1st [facility] office visit/ Jenny [name]   Chief Complaint  Patient presents with  . Pulmonary Consult    Referred by Dr. Tana [name]. Pt states she tends to cough in the spring and the fall.  She states cough is non prod. Cough can be triggered by talking for a while. She also c/o SOB, and is unsure exactly when this started. She gets SOB with grocery shopping and bringing in the groceries. She has had some swelling in her feet and legs for the past 4 months.   Cough is dry with  onset indolent May 2018 persistent daily / is worse in evenings after supper esp with  talking on phone but does fine flat hs and no noct cough  Chronic leg swelling L >R worse since may 2018  Doe= MMRC2 = can't walk a nl pace on a flat grade s sob but does fine slow and flat  rec Lasix 20 mg  Take 2 daily until swelling is better then one daily  As a maintenance   schedule 2 d echo done 04/14/18 Systolic function was normal. The estimated ejection fraction was in the range of 55%   to 60%. Wall motion was normal; there were no regional wall   motion abnormalities. There was no evidence of elevated   ventricular filling pressure by Doppler parameters. - Ventricular septum: The contour showed diastolic flattening. - Aortic valve: There was trivial regurgitation. - Left atrium: The atrium was moderately dilated. - Right ventricle: The cavity size was moderately dilated. Wall   thickness was normal. - Right atrium: The atrium was moderately  dilated. - Tricuspid valve: There was severe regurgitation. - Pulmonary arteries: Systolic pressure was mildly increased. PA   peak pressure: 31 mm Hg (S).     04/22/2017  f/u ov/Jenny [name] re:  Chf/ improved p diuresis  Chief Complaint  Patient presents with  . Follow-up    very little SOB   Doe now = MMRC1 = can walk nl pace, flat grade, can't hurry or go uphills or steps s sob   rec F/u prn    R thoracentesis 02/18/2018  :  2liters removed   Wbc 599 L >P  Borderline ex  Cytology: neg    03/10/2018  f/u ov/Jenny [name] re: chf/  Chief Complaint  Patient presents with  . Follow-up    ov after thoracentesis- c/o sob, weakness.   Dyspnea:  Across the room sob even up to 3lpm  Cough: no Sleeping: hosp bed or reclining @ slt < 45 degree most nocts SABA use: no inhalers 02: 2lpm most the time    Worse leg swelling x at least since June 2019   Niece took over meds from pt x 3 weeks- pt still living by herself  No obvious day to day or daytime variability or assoc excess/ purulent sputum or mucus plugs or hemoptysis or cp or chest tightness, subjective wheeze or overt sinus or hb symptoms.  Also denies any obvious fluctuation of symptoms with weather or environmental changes or other aggravating or alleviating factors except as outlined above   No unusual exposure hx or h/o childhood pna/ asthma or knowledge of premature birth.  Current Allergies, Complete Past Medical History, Past Surgical History, Family History, and Social History were reviewed in [facility] electronic medical record.  ROS  The following are not active complaints unless bolded Hoarseness, sore throat, dysphagia, dental problems, itching, sneezing,  nasal congestion or discharge of excess mucus or purulent secretions, ear ache,   fever, chills, sweats, unintended wt loss or wt gain, classically pleuritic or exertional cp,  orthopnea pnd or arm/hand swelling  or leg swelling, presyncope, palpitations, abdominal pain,  anorexia, nausea, vomiting, diarrhea  or change in bowel habits or change in bladder habits, change in stools or change in urine, dysuria, hematuria,  Rash intertriginous dist, arthralgias, visual complaints, headache, numbness, weakness or ataxia or problems with walking or coordination,  change in mood or  memory.        Current Meds  Medication Sig  . ALPRAZolam (XANAX) 0.25 MG tablet Take 1 tablet (0.25 mg total) by mouth daily as needed for anxiety.  Marland Kitchen. amLODipine-olmesartan (AZOR) 5-40 MG tablet TAKE 1 TABLET BY MOUTH EVERY DAY  . aspirin 81 MG tablet Take 81 mg by mouth daily.  . bisoprolol-hydrochlorothiazide (ZIAC) 5-6.25 MG tablet Take 1 tablet daily by mouth.  . citalopram (CELEXA) 20 MG tablet Take 1 tablet (20 mg total) by mouth daily.  . fenoprofen (NALFON) 600 MG TABS tablet Take 600 mg by mouth daily.  . fish oil-omega-3 fatty acids 1000 MG capsule Take 2 g by mouth daily.  . fluticasone (FLONASE) 50 MCG/ACT nasal spray Place 2 sprays into both nostrils daily.  . furosemide (LASIX) 20 MG tablet Take 2 tablets (40 mg total) by mouth daily.  . Horse Chestnut 300 MG CAPS Take 300 mg by mouth 2 (two) times daily.  . Multiple Vitamin (MULTIVITAMIN) tablet Take 1 tablet by mouth daily.    Marland Kitchen. nystatin (MYCOSTATIN/NYSTOP) powder Apply topically 4 (four) times daily.  Marland Kitchen. spironolactone (ALDACTONE) 25 MG tablet Take 1 tablet (25 mg total) by mouth daily.  . Vitamin D, Ergocalciferol, (DRISDOL) 50000 units CAPS capsule TAKE 1 CAPSULE EVERY OTHER WEEK               Objective:   Physical Exam  Frail elderly wf in  wheel chair  03/10/2018        197   04/22/2017       202   04/07/17 223 lb 6.4 oz (101.3 kg)  03/06/17 220 lb 12.8 oz (100.2 kg)  02/24/17 225 lb 6.4 oz (102.2 kg)    Vital signs reviewed - Note on arrival 02 sats  91% on   2lpm     HEENT: nl dentition, turbinates bilaterally, and oropharynx. Nl external ear canals without cough reflex   NECK :  without  JVD/Nodes/TM/ nl carotid upstrokes bilaterally   LUNGS: no acc muscle use,  Nl contour chest with decreased bs/ dullness half way up on  R  without cough on insp or exp maneuvers   CV:  RRR  no s3 or murmur or increase in P2, and  Bilateral pitting edema despite acei - like wraps.  ABD:  soft and nontender with nl inspiratory excursion in the supine position. No bruits or organomegaly appreciated, bowel sounds nl  MS:   ext warm without deformities, calf tenderness, cyanosis or  clubbing No obvious joint restrictions   SKIN: warm and dry with wraps on legs so skin not examined there  NEURO:  alert, approp, nl sensorium with  no motor or cerebellar deficits apparent.       CXR PA and Lateral:   03/10/2018 :    I personally reviewed images and agree with radiology impression as follows:   1. Large right pleural effusion significantly increased when compared to the prior study. There is associated atelectasis at the right lung base and in the central right upper lobe.   Labs ordered/ reviewed:      Chemistry      Component Value Date/Time   NA 140 03/10/2018 1545   K 4.7 03/10/2018 1545   CL 97 03/10/2018 1545   CO2 35 (H) 03/10/2018 1545   BUN 48 (H) 03/10/2018 1545   CREATININE 1.31 (H) 03/10/2018 1545      Component Value Date/Time   CALCIUM 9.8 03/10/2018 1545   ALKPHOS 60 03/10/2018 1545   AST 27 03/10/2018 1545   ALT 17 03/10/2018 1545   BILITOT 1.2 03/10/2018 1545        Lab Results  Component Value Date   WBC 7.3 03/10/2018   HGB 12.5 03/10/2018   HCT 38.9 03/10/2018   MCV 90.4 03/10/2018   PLT 316.0 03/10/2018       EOS                                                               0.3                                     03/10/2018     Lab Results  Component Value Date   TSH 3.13 03/10/2018     Lab Results  Component Value Date   PROBNP 511.0 (H) 04/07/2017       Lab Results  Component Value Date   ESRSEDRATE 34 (H) 03/10/2018   ESRSEDRATE  56 (H) 04/07/2017                  Assessment:

## 2018-03-10 NOTE — Telephone Encounter (Signed)
Spoke with Alvino ChapelJo and gave instructions per Dr. SwazilandJordan. Alvino ChapelJo also stated that she was concerned with [name]'s decline in health. She stated that patient has become incontinent at times and that she feels patient cannot take care of herself alone. She stated that [facility], they will be in contact with this office.

## 2018-03-11 ENCOUNTER — Encounter: Payer: Self-pay | Admitting: Internal Medicine

## 2018-03-11 ENCOUNTER — Telehealth: Payer: Self-pay | Admitting: Family Medicine

## 2018-03-11 ENCOUNTER — Telehealth: Payer: Self-pay | Admitting: Internal Medicine

## 2018-03-11 DIAGNOSIS — L97221 Non-pressure chronic ulcer of left calf limited to breakdown of skin: Secondary | ICD-10-CM | POA: Diagnosis not present

## 2018-03-11 DIAGNOSIS — R7989 Other specified abnormal findings of blood chemistry: Secondary | ICD-10-CM | POA: Insufficient documentation

## 2018-03-11 DIAGNOSIS — M533 Sacrococcygeal disorders, not elsewhere classified: Secondary | ICD-10-CM | POA: Diagnosis not present

## 2018-03-11 DIAGNOSIS — L97811 Non-pressure chronic ulcer of other part of right lower leg limited to breakdown of skin: Secondary | ICD-10-CM | POA: Diagnosis not present

## 2018-03-11 DIAGNOSIS — J9612 Chronic respiratory failure with hypercapnia: Secondary | ICD-10-CM

## 2018-03-11 DIAGNOSIS — I872 Venous insufficiency (chronic) (peripheral): Secondary | ICD-10-CM | POA: Diagnosis not present

## 2018-03-11 DIAGNOSIS — M810 Age-related osteoporosis without current pathological fracture: Secondary | ICD-10-CM | POA: Diagnosis not present

## 2018-03-11 DIAGNOSIS — I10 Essential (primary) hypertension: Secondary | ICD-10-CM | POA: Diagnosis not present

## 2018-03-11 DIAGNOSIS — J9611 Chronic respiratory failure with hypoxia: Secondary | ICD-10-CM | POA: Insufficient documentation

## 2018-03-11 NOTE — Assessment & Plan Note (Addendum)
HC03  03/10/2018 = 35   Well compensated on present rx - the hypercarbic component will likely resolve p thoracentesis is repeated

## 2018-03-11 NOTE — Telephone Encounter (Signed)
See phone note

## 2018-03-11 NOTE — Assessment & Plan Note (Signed)
Lab Results  Component Value Date   CREATININE 1.31 (H) 03/10/2018   CREATININE 0.93 02/10/2018   CREATININE 0.84 09/22/2017     On lasix/aldactone/ ? nsaid's plus ARB > rec stop all nsaids   Will need to return for full med reconciliation next and note the niece has now taken over all her meds:  To keep things simple, I have asked the patient to first separate medicines that are perceived as maintenance, that is to be taken daily "no matter what", from those medicines that are taken on only on an as-needed basis and I have given the patient examples of both, and then return to see our NP to generate a  detailed  medication calendar which should be followed until the next physician sees the patient and updates it.     I had an extended discussion with the patient reviewing all relevant studies completed to date and  lasting 25 minutes of a 40  minute  office visit to re-establish with me re severe non-specific but potentially very serious refractory respiratory symptoms of uncertain and potentially multiple  etiologies.   Each maintenance medication was reviewed in detail including most importantly the difference between maintenance and prns and under what circumstances the prns are to be triggered using an action plan format that is not reflected in the computer generated alphabetically organized AVS.    Please see AVS for specific instructions unique to this office visit that I personally wrote and verbalized to the the pt in detail and then reviewed with pt  by my nurse highlighting any changes in therapy/plan of care  recommended at today's visit.

## 2018-03-11 NOTE — Assessment & Plan Note (Addendum)
See cxr 02/10/18 > thoracentesis 02/18/2018  :  2liters removed   Wbc 599 L >P  Borderline ex  Cytology: neg  Clearly worse despite as much diuresis as she'll tol in setting of biatrial enlargement at previous echo and nl tsh, min decrease in Alb so needs repeat Echo and likely will need RHC as well as this is most likely chf related with malignancy still in ddx  In meantime:  rec  Tap again this week  if possible send for repeat studies to sort out transudate vs exudate Proceed with echo  No change in diuretics/ 02    Discussed in detail all the  indications, usual  risks and alternatives  relative to the benefits with patient who agrees to proceed with rx/and w/u  as outlined

## 2018-03-11 NOTE — Telephone Encounter (Signed)
Niece Corrie Dandy([place] [phone]) wants to know why patient needs to have another thoracentesis; and where fluid will be drained from. unable to come with patient to yesterday appointment; procedure scheduled for tomorrow afternoon.

## 2018-03-11 NOTE — Telephone Encounter (Signed)
Copied from CRM [id number]. Topic: Quick Communication - See Telephone Encounter >> Mar 11, 2018  2:39 PM Burchel, Abbi R wrote: CRM for notification. See Telephone encounter for: 03/11/18.  Phys Therapy Assistant Wetzel Bjornstad(William Carey) with [facility] called to report that pt fell last night aprox 2am.  Pt does not report any injury or pain.   [phone]

## 2018-03-11 NOTE — Telephone Encounter (Signed)
Will send as FYI 

## 2018-03-11 NOTE — Telephone Encounter (Signed)
Called and spoke to patient's niece, Corrie DandyMary, who is listed on DPR. She was unable to come to patient's appointment yesterday and wanted more information on why patient would need another thoracentesis.  Gave information from the x-rays and Dr. Thurston HoleWert's notes. Niece stated they have a family member that is also a doctor so she would run this information by them.   Nothing further needed at this time.

## 2018-03-12 ENCOUNTER — Emergency Department (HOSPITAL_COMMUNITY): Payer: Medicare Other

## 2018-03-12 ENCOUNTER — Inpatient Hospital Stay (HOSPITAL_COMMUNITY): Payer: Medicare Other

## 2018-03-12 ENCOUNTER — Ambulatory Visit (HOSPITAL_COMMUNITY): Payer: Medicare Other

## 2018-03-12 ENCOUNTER — Inpatient Hospital Stay (HOSPITAL_COMMUNITY)
Admission: EM | Admit: 2018-03-12 | Discharge: 2018-03-21 | DRG: 871 | Disposition: E | Payer: Medicare Other | Attending: Pulmonary Disease | Admitting: Pulmonary Disease

## 2018-03-12 ENCOUNTER — Telehealth: Payer: Self-pay | Admitting: Internal Medicine

## 2018-03-12 DIAGNOSIS — N179 Acute kidney failure, unspecified: Secondary | ICD-10-CM | POA: Diagnosis present

## 2018-03-12 DIAGNOSIS — Z9181 History of falling: Secondary | ICD-10-CM

## 2018-03-12 DIAGNOSIS — Z66 Do not resuscitate: Secondary | ICD-10-CM | POA: Diagnosis not present

## 2018-03-12 DIAGNOSIS — G9341 Metabolic encephalopathy: Secondary | ICD-10-CM | POA: Diagnosis present

## 2018-03-12 DIAGNOSIS — J9600 Acute respiratory failure, unspecified whether with hypoxia or hypercapnia: Secondary | ICD-10-CM

## 2018-03-12 DIAGNOSIS — J9621 Acute and chronic respiratory failure with hypoxia: Secondary | ICD-10-CM | POA: Diagnosis present

## 2018-03-12 DIAGNOSIS — R238 Other skin changes: Secondary | ICD-10-CM | POA: Diagnosis present

## 2018-03-12 DIAGNOSIS — G039 Meningitis, unspecified: Secondary | ICD-10-CM | POA: Diagnosis not present

## 2018-03-12 DIAGNOSIS — Z79899 Other long term (current) drug therapy: Secondary | ICD-10-CM

## 2018-03-12 DIAGNOSIS — Z90710 Acquired absence of both cervix and uterus: Secondary | ICD-10-CM

## 2018-03-12 DIAGNOSIS — E875 Hyperkalemia: Secondary | ICD-10-CM | POA: Diagnosis present

## 2018-03-12 DIAGNOSIS — J9611 Chronic respiratory failure with hypoxia: Secondary | ICD-10-CM | POA: Diagnosis present

## 2018-03-12 DIAGNOSIS — M810 Age-related osteoporosis without current pathological fracture: Secondary | ICD-10-CM | POA: Diagnosis present

## 2018-03-12 DIAGNOSIS — Z4682 Encounter for fitting and adjustment of non-vascular catheter: Secondary | ICD-10-CM | POA: Diagnosis not present

## 2018-03-12 DIAGNOSIS — Z452 Encounter for adjustment and management of vascular access device: Secondary | ICD-10-CM | POA: Diagnosis not present

## 2018-03-12 DIAGNOSIS — A419 Sepsis, unspecified organism: Principal | ICD-10-CM | POA: Diagnosis present

## 2018-03-12 DIAGNOSIS — R918 Other nonspecific abnormal finding of lung field: Secondary | ICD-10-CM | POA: Diagnosis not present

## 2018-03-12 DIAGNOSIS — Z6837 Body mass index (BMI) 37.0-37.9, adult: Secondary | ICD-10-CM

## 2018-03-12 DIAGNOSIS — Z9049 Acquired absence of other specified parts of digestive tract: Secondary | ICD-10-CM | POA: Diagnosis not present

## 2018-03-12 DIAGNOSIS — Z8249 Family history of ischemic heart disease and other diseases of the circulatory system: Secondary | ICD-10-CM | POA: Diagnosis not present

## 2018-03-12 DIAGNOSIS — R4020 Unspecified coma: Secondary | ICD-10-CM | POA: Diagnosis not present

## 2018-03-12 DIAGNOSIS — R911 Solitary pulmonary nodule: Secondary | ICD-10-CM | POA: Diagnosis present

## 2018-03-12 DIAGNOSIS — Z7982 Long term (current) use of aspirin: Secondary | ICD-10-CM

## 2018-03-12 DIAGNOSIS — R4182 Altered mental status, unspecified: Secondary | ICD-10-CM | POA: Diagnosis not present

## 2018-03-12 DIAGNOSIS — Z820 Family history of epilepsy and other diseases of the nervous system: Secondary | ICD-10-CM

## 2018-03-12 DIAGNOSIS — G9340 Encephalopathy, unspecified: Secondary | ICD-10-CM | POA: Diagnosis not present

## 2018-03-12 DIAGNOSIS — I082 Rheumatic disorders of both aortic and tricuspid valves: Secondary | ICD-10-CM | POA: Diagnosis present

## 2018-03-12 DIAGNOSIS — L8990 Pressure ulcer of unspecified site, unspecified stage: Secondary | ICD-10-CM

## 2018-03-12 DIAGNOSIS — J9690 Respiratory failure, unspecified, unspecified whether with hypoxia or hypercapnia: Secondary | ICD-10-CM | POA: Diagnosis not present

## 2018-03-12 DIAGNOSIS — Z515 Encounter for palliative care: Secondary | ICD-10-CM | POA: Diagnosis not present

## 2018-03-12 DIAGNOSIS — M461 Sacroiliitis, not elsewhere classified: Secondary | ICD-10-CM | POA: Diagnosis present

## 2018-03-12 DIAGNOSIS — R0902 Hypoxemia: Secondary | ICD-10-CM | POA: Diagnosis not present

## 2018-03-12 DIAGNOSIS — F419 Anxiety disorder, unspecified: Secondary | ICD-10-CM | POA: Diagnosis present

## 2018-03-12 DIAGNOSIS — I5033 Acute on chronic diastolic (congestive) heart failure: Secondary | ICD-10-CM | POA: Diagnosis present

## 2018-03-12 DIAGNOSIS — J918 Pleural effusion in other conditions classified elsewhere: Secondary | ICD-10-CM | POA: Diagnosis present

## 2018-03-12 DIAGNOSIS — I10 Essential (primary) hypertension: Secondary | ICD-10-CM | POA: Diagnosis not present

## 2018-03-12 DIAGNOSIS — N6489 Other specified disorders of breast: Secondary | ICD-10-CM | POA: Diagnosis not present

## 2018-03-12 DIAGNOSIS — R0602 Shortness of breath: Secondary | ICD-10-CM | POA: Diagnosis not present

## 2018-03-12 DIAGNOSIS — R6520 Severe sepsis without septic shock: Secondary | ICD-10-CM | POA: Diagnosis present

## 2018-03-12 DIAGNOSIS — J9601 Acute respiratory failure with hypoxia: Secondary | ICD-10-CM | POA: Diagnosis not present

## 2018-03-12 DIAGNOSIS — I110 Hypertensive heart disease with heart failure: Secondary | ICD-10-CM | POA: Diagnosis present

## 2018-03-12 DIAGNOSIS — E785 Hyperlipidemia, unspecified: Secondary | ICD-10-CM | POA: Diagnosis present

## 2018-03-12 DIAGNOSIS — R6521 Severe sepsis with septic shock: Secondary | ICD-10-CM | POA: Diagnosis not present

## 2018-03-12 DIAGNOSIS — J90 Pleural effusion, not elsewhere classified: Secondary | ICD-10-CM | POA: Diagnosis not present

## 2018-03-12 DIAGNOSIS — R000 Tachycardia, unspecified: Secondary | ICD-10-CM | POA: Diagnosis not present

## 2018-03-12 DIAGNOSIS — Z8042 Family history of malignant neoplasm of prostate: Secondary | ICD-10-CM

## 2018-03-12 DIAGNOSIS — Z9981 Dependence on supplemental oxygen: Secondary | ICD-10-CM

## 2018-03-12 DIAGNOSIS — D72829 Elevated white blood cell count, unspecified: Secondary | ICD-10-CM | POA: Diagnosis not present

## 2018-03-12 DIAGNOSIS — Z9911 Dependence on respirator [ventilator] status: Secondary | ICD-10-CM | POA: Diagnosis not present

## 2018-03-12 DIAGNOSIS — R0603 Acute respiratory distress: Secondary | ICD-10-CM | POA: Diagnosis not present

## 2018-03-12 DIAGNOSIS — R410 Disorientation, unspecified: Secondary | ICD-10-CM | POA: Diagnosis not present

## 2018-03-12 LAB — COMPREHENSIVE METABOLIC PANEL
ALBUMIN: 3 g/dL — AB (ref 3.5–5.0)
ALK PHOS: 60 U/L (ref 38–126)
ALT: 27 U/L (ref 0–44)
ANION GAP: 13 (ref 5–15)
AST: 54 U/L — ABNORMAL HIGH (ref 15–41)
BUN: 62 mg/dL — ABNORMAL HIGH (ref 8–23)
CALCIUM: 8.9 mg/dL (ref 8.9–10.3)
CHLORIDE: 101 mmol/L (ref 98–111)
CO2: 31 mmol/L (ref 22–32)
CREATININE: 1.96 mg/dL — AB (ref 0.44–1.00)
GFR calc non Af Amer: 23 mL/min — ABNORMAL LOW (ref >60)
GFR, EST AFRICAN AMERICAN: 26 mL/min — AB (ref >60)
GLUCOSE: 103 mg/dL — AB (ref 70–99)
Potassium: 5.7 mmol/L — ABNORMAL HIGH (ref 3.5–5.1)
SODIUM: 145 mmol/L (ref 135–145)
Total Bilirubin: 2.1 mg/dL — ABNORMAL HIGH (ref 0.3–1.2)
Total Protein: 6.7 g/dL (ref 6.5–8.1)

## 2018-03-12 LAB — CBC WITH DIFFERENTIAL/PLATELET
Basophils Absolute: 0 10*3/uL (ref 0.0–0.1)
Basophils Relative: 0 %
Eosinophils Absolute: 0.1 10*3/uL (ref 0.0–0.7)
Eosinophils Relative: 1 %
HEMATOCRIT: 42.2 % (ref 36.0–46.0)
HEMOGLOBIN: 12.8 g/dL (ref 12.0–15.0)
LYMPHS ABS: 0.9 10*3/uL (ref 0.7–4.0)
Lymphocytes Relative: 8 %
MCH: 29.5 pg (ref 26.0–34.0)
MCHC: 30.3 g/dL (ref 30.0–36.0)
MCV: 97.2 fL (ref 78.0–100.0)
MONOS PCT: 18 %
Monocytes Absolute: 2.2 10*3/uL — ABNORMAL HIGH (ref 0.1–1.0)
NEUTROS ABS: 8.9 10*3/uL — AB (ref 1.7–7.7)
NEUTROS PCT: 73 %
Platelets: 384 10*3/uL (ref 150–400)
RBC: 4.34 MIL/uL (ref 3.87–5.11)
RDW: 17.3 % — ABNORMAL HIGH (ref 11.5–15.5)
WBC: 12.2 10*3/uL — ABNORMAL HIGH (ref 4.0–10.5)

## 2018-03-12 LAB — BRAIN NATRIURETIC PEPTIDE: B Natriuretic Peptide: 854.9 pg/mL — ABNORMAL HIGH (ref 0.0–100.0)

## 2018-03-12 LAB — BLOOD GAS, ARTERIAL
Acid-Base Excess: 2.8 mmol/L — ABNORMAL HIGH (ref 0.0–2.0)
Bicarbonate: 29.1 mmol/L — ABNORMAL HIGH (ref 20.0–28.0)
Drawn by: 441261
FIO2: 100
O2 SAT: 95.7 %
PEEP: 5 cmH2O
Patient temperature: 98.6
RATE: 18 resp/min
VT: 400 mL
pCO2 arterial: 55.8 mmHg — ABNORMAL HIGH (ref 32.0–48.0)
pH, Arterial: 7.337 — ABNORMAL LOW (ref 7.350–7.450)
pO2, Arterial: 89.1 mmHg (ref 83.0–108.0)

## 2018-03-12 LAB — PROTEIN, PLEURAL OR PERITONEAL FLUID

## 2018-03-12 LAB — CBC
HEMATOCRIT: 43.9 % (ref 36.0–46.0)
HEMOGLOBIN: 13.3 g/dL (ref 12.0–15.0)
MCH: 29.1 pg (ref 26.0–34.0)
MCHC: 30.3 g/dL (ref 30.0–36.0)
MCV: 96.1 fL (ref 78.0–100.0)
Platelets: 453 10*3/uL — ABNORMAL HIGH (ref 150–400)
RBC: 4.57 MIL/uL (ref 3.87–5.11)
RDW: 16.9 % — AB (ref 11.5–15.5)
WBC: 16.9 10*3/uL — ABNORMAL HIGH (ref 4.0–10.5)

## 2018-03-12 LAB — MRSA PCR SCREENING: MRSA by PCR: NEGATIVE

## 2018-03-12 LAB — LACTATE DEHYDROGENASE, PLEURAL OR PERITONEAL FLUID: LD FL: 98 U/L — AB (ref 3–23)

## 2018-03-12 LAB — GLUCOSE, PLEURAL OR PERITONEAL FLUID: Glucose, Fluid: 94 mg/dL

## 2018-03-12 LAB — I-STAT CG4 LACTIC ACID, ED
LACTIC ACID, VENOUS: 1.83 mmol/L (ref 0.5–1.9)
Lactic Acid, Venous: 2.16 mmol/L (ref 0.5–1.9)

## 2018-03-12 LAB — TROPONIN I: Troponin I: <0.03 ng/mL (ref <0.03)

## 2018-03-12 MED ORDER — PIPERACILLIN-TAZOBACTAM 3.375 G IVPB
3.3750 g | Freq: Three times a day (TID) | INTRAVENOUS | Status: DC
  Administered 2018-03-12 – 2018-03-13 (×3): 3.375 g via INTRAVENOUS
  Filled 2018-03-12 (×3): qty 50

## 2018-03-12 MED ORDER — FENTANYL CITRATE (PF) 100 MCG/2ML IJ SOLN
INTRAMUSCULAR | Status: AC
  Filled 2018-03-12: qty 2

## 2018-03-12 MED ORDER — ENOXAPARIN SODIUM 40 MG/0.4ML ~~LOC~~ SOLN
40.0000 mg | SUBCUTANEOUS | Status: DC
  Administered 2018-03-12: 40 mg via SUBCUTANEOUS
  Filled 2018-03-12: qty 0.4

## 2018-03-12 MED ORDER — PHENYLEPHRINE 40 MCG/ML (10ML) SYRINGE FOR IV PUSH (FOR BLOOD PRESSURE SUPPORT)
200.0000 ug | PREFILLED_SYRINGE | Freq: Once | INTRAVENOUS | Status: AC
  Administered 2018-03-12: 200 ug via INTRAVENOUS
  Filled 2018-03-12: qty 10

## 2018-03-12 MED ORDER — SODIUM CHLORIDE 0.9 % IV SOLN
1.0000 mg/h | INTRAVENOUS | Status: DC
  Administered 2018-03-12: 0.5 mg/h via INTRAVENOUS
  Filled 2018-03-12 (×2): qty 10

## 2018-03-12 MED ORDER — VANCOMYCIN HCL IN DEXTROSE 1-5 GM/200ML-% IV SOLN
1000.0000 mg | Freq: Once | INTRAVENOUS | Status: AC
  Administered 2018-03-12: 1000 mg via INTRAVENOUS
  Filled 2018-03-12: qty 200

## 2018-03-12 MED ORDER — VANCOMYCIN HCL IN DEXTROSE 750-5 MG/150ML-% IV SOLN
750.0000 mg | INTRAVENOUS | Status: DC
  Filled 2018-03-12: qty 150

## 2018-03-12 MED ORDER — ASPIRIN EC 81 MG PO TBEC: 81.0000 mg | DELAYED_RELEASE_TABLET | Freq: Every day | ORAL | Status: DC

## 2018-03-12 MED ORDER — CITALOPRAM HYDROBROMIDE 10 MG PO TABS
20.0000 mg | ORAL_TABLET | Freq: Every day | ORAL | Status: DC
  Administered 2018-03-13 – 2018-03-14 (×2): 20 mg via ORAL
  Filled 2018-03-12: qty 1
  Filled 2018-03-12 (×2): qty 2

## 2018-03-12 MED ORDER — PROPOFOL 1000 MG/100ML IV EMUL
5.0000 ug/kg/min | Freq: Once | INTRAVENOUS | Status: AC
  Administered 2018-03-12: 5 ug/kg/min via INTRAVENOUS
  Filled 2018-03-12: qty 100

## 2018-03-12 MED ORDER — PHENYLEPHRINE 40 MCG/ML (10ML) SYRINGE FOR IV PUSH (FOR BLOOD PRESSURE SUPPORT)
80.0000 ug | PREFILLED_SYRINGE | Freq: Once | INTRAVENOUS | Status: AC
  Administered 2018-03-12: 80 ug via INTRAVENOUS
  Filled 2018-03-12: qty 10

## 2018-03-12 MED ORDER — SODIUM CHLORIDE 0.9 % IV SOLN
250.0000 mL | INTRAVENOUS | Status: DC | PRN
  Administered 2018-03-13 – 2018-03-14 (×2): 250 mL via INTRAVENOUS

## 2018-03-12 MED ORDER — VITAL AF 1.2 CAL PO LIQD
1000.0000 mL | ORAL | Status: DC
  Administered 2018-03-12 – 2018-03-15 (×4): 1000 mL

## 2018-03-12 MED ORDER — PANTOPRAZOLE SODIUM 40 MG PO TBEC
40.0000 mg | DELAYED_RELEASE_TABLET | Freq: Every day | ORAL | Status: DC
  Administered 2018-03-12: 40 mg via ORAL
  Filled 2018-03-12: qty 1

## 2018-03-12 MED ORDER — NYSTATIN 100000 UNIT/GM EX POWD
Freq: Four times a day (QID) | CUTANEOUS | Status: DC
  Administered 2018-03-12 – 2018-03-13 (×4): via TOPICAL
  Filled 2018-03-12 (×2): qty 15

## 2018-03-12 MED ORDER — PIPERACILLIN-TAZOBACTAM 3.375 G IVPB 30 MIN
3.3750 g | Freq: Once | INTRAVENOUS | Status: AC
  Administered 2018-03-12: 3.375 g via INTRAVENOUS
  Filled 2018-03-12: qty 50

## 2018-03-12 MED ORDER — ALPRAZOLAM 0.25 MG PO TABS
0.2500 mg | ORAL_TABLET | Freq: Every day | ORAL | Status: DC | PRN
  Administered 2018-03-12: 0.25 mg via ORAL
  Filled 2018-03-12: qty 1

## 2018-03-12 MED ORDER — NOREPINEPHRINE 4 MG/250ML-% IV SOLN
0.0000 ug/min | INTRAVENOUS | Status: AC
  Administered 2018-03-12: 2 ug/min via INTRAVENOUS
  Filled 2018-03-12: qty 250

## 2018-03-12 MED ORDER — MIDAZOLAM HCL 2 MG/2ML IJ SOLN
INTRAMUSCULAR | Status: AC
  Filled 2018-03-12: qty 2

## 2018-03-12 MED ORDER — NOREPINEPHRINE 4 MG/250ML-% IV SOLN: 0.0000 ug/min | Freq: Once | INTRAVENOUS | Status: DC

## 2018-03-12 MED ORDER — NOREPINEPHRINE 4 MG/250ML-% IV SOLN
0.0000 ug/min | INTRAVENOUS | Status: DC
  Administered 2018-03-12: 23 ug/min via INTRAVENOUS
  Administered 2018-03-12: 20 ug/min via INTRAVENOUS
  Administered 2018-03-13: 28 ug/min via INTRAVENOUS
  Administered 2018-03-13: 20 ug/min via INTRAVENOUS
  Administered 2018-03-13: 28 ug/min via INTRAVENOUS
  Administered 2018-03-13: 27 ug/min via INTRAVENOUS
  Filled 2018-03-12 (×6): qty 250

## 2018-03-12 MED FILL — Medication: Qty: 1 | Status: AC

## 2018-03-12 NOTE — Progress Notes (Signed)
A consult was received from an ED physician for Vancomycin per pharmacy dosing.  The patient's profile has been reviewed for ht/wt/allergies/indication/available labs.   A one time order has been placed for Vancomycin 1gm.  Further antibiotics/pharmacy consults should be ordered by admitting physician if indicated.                       Thank you,  Otho BellowsGreen, Trace Cederberg L 02/20/2018  4:25 PM

## 2018-03-12 NOTE — ED Notes (Signed)
[facility] patient has critical lactic value of 2.16

## 2018-03-12 NOTE — Telephone Encounter (Signed)
Message sent to Dr. Jordan for review. 

## 2018-03-12 NOTE — ED Notes (Signed)
Bed: RESB Expected date:  Expected time:  Means of arrival:  Comments: EMS-respiratory distress 

## 2018-03-12 NOTE — Progress Notes (Signed)
Pharmacy Antibiotic Note  Jenny [name] is a 84 y.o. female admitted on 02-26-2018 with sepsis, large R pleural effusion, was sched for thoracentesis as outpt, admit- tx as sepsis, required intubation, pressors Pharmacy has been consulted for Vancomycin & Zosyn dosing.  Plan: Zosyn 3.375gm q8hr, 4 hr infusion Vancomycin 1gm x1, then 750mg  q48hr Daily SCr  Height: 5\' 2"  (157.5 cm) Weight: 197 lb 8.5 oz (89.6 kg) IBW/kg (Calculated) : 50.1  Temp (24hrs), Avg:98.6 F (37 C), Min:98.4 F (36.9 C), Max:98.8 F (37.1 C)  Recent Labs  Lab 03/10/18 1545 Nov 18, 2017 1447 Nov 18, 2017 1504 Nov 18, 2017 1714 Nov 18, 2017 1722 Nov 18, 2017 1943  WBC 7.3 12.2*  --   --   --  16.9*  CREATININE 1.31*  --   --  1.96*  --   --   LATICACIDVEN  --   --  2.16*  --  1.83  --     Estimated Creatinine Clearance: 23 mL/min (A) (by C-G formula based on SCr of 1.96 mg/dL (H)).    No Known Allergies  Antimicrobials this admission: 8/23 Zosyn >>  8/23 Vanc >>   Dose adjustments this admission:  Microbiology results: 8/23 BCx: sent 8/23 MRSA PCR: neg 8/23 Pleural fluid: no organisms on gram stain  Thank you for allowing pharmacy to be a part of this patient's care.  Otho BellowsGreen, Pier Bosher L PharmD Pager [phone] 02-26-2018, 9:15 PM

## 2018-03-12 NOTE — H&P (Addendum)
Jenny HENCKEL  [name]:[id number] DOB: May 21, 1934 DOA: 03/14/2018 PCP: [name], Jenny G, MD    Reason for Consult / Chief Complaint:  Shortness of breath Respiratory failure  Consulting MD:  Dr. Patria Mane  HPI/Brief Narrative   Patient known to Dr. Sherene Sires who had been scheduled for thoracentesis today Thoracentesis was not performed today Her breathing worsened significantly today Required high flow oxygen brought into the emergency room She decompensated in the emergency room and not to be intubated  Assessment & Plan:  1.  Hypoxemic respiratory failure -Likely related to a large right pleural effusion - 2.  Large right pleural effusion -Previous thoracentesis had cytology negative-performed 02/18/2018 for 2 L -Felt to be related to heart failure  3.  History of pulmonary nodule -5 lung nodule right upper lobe  4.  History of chronic cough -Being addressed  5.  Obesity  6.  Possible sepsis  .  Macerated lesions under both breasts .  We will continue antibiotics at present   Best practice/Goals of care/disposition.   DVT prophylaxis: Enoxaparin GI prophylaxis: Protonix Diet: N.p.o. at present Mobility: Bedrest Code Status: Full code Family Communication: No family present  Disposition / Summary of Today's Plan 03/18/2018   We will continue to stabilize She is on full vent support at the present time Requiring pressors   Procedures: Thoracentesis Had 1700 cc drained from the right pleural space  Significant Diagnostic Tests: Lab data from thoracentesis pending  Micro Data: Cultures from thoracentesis pending  Antimicrobials:  Empirically started on vancomycin and Zosyn  Subjective  Elderly lady with large pleural effusion came in with respiratory failure Decompensated in the emergency department and not to be intubated  Objective    Examination: General: Elderly lady, sedated on vent HENT: Moist oral mucosa Lungs: Decreased air entry on the  right Cardiovascular: S1-S2 appreciated Abdomen: Bowel sounds appreciated Extremities: Edema Neuro: Sedate GU: fair urine output   Blood pressure 101/67, pulse (!) 58, resp. rate 19, height 5\' 2"  (1.575 m), SpO2 95 %.    Vent Mode: PRVC FiO2 (%):  [100 %] 100 % Set Rate:  [18 bmp] 18 bmp Vt Set:  [400 mL] 400 mL PEEP:  [5 cmH20] 5 cmH20 Plateau Pressure:  [21 cmH20] 21 cmH20   Intake/Output Summary (Last 24 hours) at 02/18/2018 1713 Last data filed at 02/24/2018 1624 Gross per 24 hour  Intake 50 ml  Output -  Net 50 ml   There were no vitals filed for this visit.   Labs   CBC: Recent Labs  Lab 03/10/18 1545 03/05/2018 1447  WBC 7.3 12.2*  NEUTROABS 5.4 8.9*  HGB 12.5 12.8  HCT 38.9 42.2  MCV 90.4 97.2  PLT 316.0 384   Basic Metabolic Panel: Recent Labs  Lab 03/10/18 1545  NA 140  K 4.7  CL 97  CO2 35*  GLUCOSE 77  BUN 48*  CREATININE 1.31*  CALCIUM 9.8   GFR: Estimated Creatinine Clearance: 34.4 mL/min (A) (by C-G formula based on SCr of 1.31 mg/dL (H)). Recent Labs  Lab 03/10/18 1545 03/09/2018 1447 02/28/2018 1504  WBC 7.3 12.2*  --   LATICACIDVEN  --   --  2.16*   Liver Function Tests: Recent Labs  Lab 03/10/18 1545  AST 27  ALT 17  ALKPHOS 60  BILITOT 1.2  PROT 6.9  ALBUMIN 3.2*   No results for input(s): LIPASE, AMYLASE in the last 168 hours. No results for input(s): AMMONIA in the last 168 hours.  ABG    Component Value Date/Time   PHART 7.337 (L) 02/23/2018 1510   PCO2ART 55.8 (H) 02/23/2018 1510   PO2ART 89.1 02/22/2018 1510   HCO3 29.1 (H) 03/16/2018 1510   O2SAT 95.7 03/17/2018 1510    Coagulation Profile: No results for input(s): INR, PROTIME in the last 168 hours. Cardiac Enzymes: Recent Labs  Lab 02/19/2018 1447  TROPONINI <0.03   HbA1C: No results found for: HGBA1C CBG: No results for input(s): GLUCAP in the last 168 hours.   Review of Systems:     Past medical history   Past Medical History:  Diagnosis Date   . Anxiety   . Arthritis   . Hyperlipidemia   . Hypertension   . Osteoporosis    Social History   Social History   Socioeconomic History  . Marital status: Widowed    Spouse name: Not on file  . Number of children: Not on file  . Years of education: Not on file  . Highest education level: Not on file  Occupational History  . Not on file  Social Needs  . Financial resource strain: Not on file  . Food insecurity:    Worry: Not on file    Inability: Not on file  . Transportation needs:    Medical: Not on file    Non-medical: Not on file  Tobacco Use  . Smoking status: Never Smoker  . Smokeless tobacco: Never Used  Substance and Sexual Activity  . Alcohol use: No  . Drug use: No  . Sexual activity: Not Currently  Lifestyle  . Physical activity:    Days per week: Not on file    Minutes per session: Not on file  . Stress: Not on file  Relationships  . Social connections:    Talks on phone: Not on file    Gets together: Not on file    Attends religious service: Not on file    Active member of club or organization: Not on file    Attends meetings of clubs or organizations: Not on file    Relationship status: Not on file  . Intimate partner violence:    Fear of current or ex partner: Not on file    Emotionally abused: Not on file    Physically abused: Not on file    Forced sexual activity: Not on file  Other Topics Concern  . Not on file  Social History Narrative   Widowed 2012. 2 children (lost 3rd son unknown cause at 74). 9 grandchildren. 2 greatgrandchildren.    Lives alone. Does everything for herself except yardwork. 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       Hobbies: day trips with [facility] center, time with family, enjoys meal gatherings    Family history    Family History  Problem Relation Age of Onset  . Prostate cancer Father   . Hypertension Father   .  Parkinsonism Brother     Allergies No Known Allergies  Home meds  Prior to Admission medications   Medication Sig Start Date End Date Taking? Authorizing Provider  ALPRAZolam (XANAX) 0.25 MG tablet Take 1 tablet (0.25 mg total) by mouth daily as needed for anxiety. 09/22/17   [name], Jenny G, MD  amLODipine-olmesartan (AZOR) 5-40 MG tablet TAKE 1 TABLET BY MOUTH EVERY DAY 03/01/18   [name], Jenny G, MD  aspirin 81 MG tablet Take 81 mg  by mouth daily.    [provider]  bisoprolol-hydrochlorothiazide (ZIAC) 5-6.25 MG tablet Take 1 tablet daily by mouth. 05/26/17   SwazilandJordan, Jenny G, MD  citalopram (CELEXA) 20 MG tablet Take 1 tablet (20 mg total) by mouth daily. 12/28/17   SwazilandJordan, Jenny G, MD  fenoprofen (NALFON) 600 MG TABS tablet Take 600 mg by mouth daily.    [provider]  fish oil-omega-3 fatty acids 1000 MG capsule Take 2 g by mouth daily.    [provider]  fluticasone (FLONASE) 50 MCG/ACT nasal spray Place 2 sprays into both nostrils daily. 01/19/18   SwazilandJordan, Jenny G, MD  furosemide (LASIX) 20 MG tablet Take 2 tablets (40 mg total) by mouth daily. 03/01/18   Nyoka CowdenWert, Michael B, MD  Horse Chestnut 300 MG CAPS Take 300 mg by mouth 2 (two) times daily.    [provider]  Multiple Vitamin (MULTIVITAMIN) tablet Take 1 tablet by mouth daily.      [provider]  nystatin (MYCOSTATIN/NYSTOP) powder Apply topically 4 (four) times daily. 03/05/18   SwazilandJordan, Jenny G, MD  spironolactone (ALDACTONE) 25 MG tablet Take 1 tablet (25 mg total) by mouth daily. 02/11/18   Glenford BayleyWalsh, Elizabeth W, NP  Vitamin D, Ergocalciferol, (DRISDOL) 50000 units CAPS capsule TAKE 1 CAPSULE EVERY OTHER WEEK 01/15/17   Shelva MajesticHunter, Stephen O, MD     LOS: 0 days   Care time spent evaluating and reviewing records, from late and plan of care 35 minutes

## 2018-03-12 NOTE — ED Notes (Signed)
Right Thoracentesis completed by Dr. Wynona Neatlalere with return of 1700 ml.

## 2018-03-12 NOTE — Procedures (Signed)
Thoracentesis Procedure Note  Pre-operative Diagnosis: large recurrent pleural effusion  Post-operative Diagnosis: same  Indications: large right pleural effusion  Procedure Details  Consent: performed emergently for respiratory failure, hypotension  Under sterile conditions the patient was positioned. Betadine solution and sterile drapes were utilized.  1% plain lidocaine was used to anesthetize the 7th rib space. Fluid was obtained without any difficulties and minimal blood loss.  A dressing was applied to the wound and wound care instructions were provided.   Findings 1700 ml of clear pleural fluid was obtained. A sample was sent to Pathology for cytogenetics, flow, and cell counts, as well as for infection analysis.  Complications:  None; patient tolerated the procedure well.          Condition: stable  Plan A follow up chest x-ray was ordered. Bed Rest for 0 hours. Tylenol 650 mg. for pain.  Attending Attestation: I performed the procedure.

## 2018-03-12 NOTE — Telephone Encounter (Signed)
Patient niece, [name] had called to let Dr Sherene SiresWert know that [name] thoracentesis was not preformed today.  The Patient became very confused, could not catch her breath, and was making gurgling sounds this morning.  [facility] and [facility].  She stated that they have not been told anything that is going on and are waiting. She stated that she will keep Dr. Sherene SiresWert updated.  Will forward to Dr. Sherene SiresWert

## 2018-03-12 NOTE — Care Management Note (Signed)
Case Management Note  CM noted pt was active with [facility].  Contacted Alvino Chapelllen with [facility] to advise pt is being admitted.  She will follow for needs.  Shealeigh Dunstan, Lynnae SandhoffAngela N, RN 29-Aug-2017, 3:33 PM

## 2018-03-12 NOTE — Procedures (Signed)
Arterial Catheter Insertion Procedure Note Betsy CoderMary A Mesquita [id number] 02/13/1934  Procedure: Insertion of Arterial Catheter  Indications: Blood pressure monitoring  Procedure Details Consent: Unable to obtain consent because of emergent medical necessity. Time Out: Verified patient identification, verified procedure, site/side was marked, verified correct patient position, special equipment/implants available, medications/allergies/relevent history reviewed, required imaging and test results available.  Performed  Maximum sterile technique was used including antiseptics, cap, gloves, gown, hand hygiene, mask and sheet. Skin prep: Chlorhexidine; local anesthetic administered 22 gauge catheter was inserted into right radial artery using the Seldinger technique. ULTRASOUND GUIDANCE USED: NO Evaluation Blood flow good; BP tracing good. Complications: No apparent complications.   Rosendo Couser A Tiara Maultsby 2017-12-03

## 2018-03-12 NOTE — ED Provider Notes (Addendum)
[facility]-EMERGENCY DEPT Provider Note   CSN: [id number] Arrival date & time: 02/26/2018  1409     History   Chief Complaint Chief Complaint  Patient presents with  . Shortness of Breath   Level 5 caveat: Respiratory distress, altered mental status   HPI Jenny [name] is a 84 y.o. female.  HPI 84 year old female who presents emergency department with what appears to be worsening respiratory status over the past week.  She requires intermittent thoracentesis.  She was seen by pulmonary 2 days ago and had worsening of her breathing today.  Is reported that her breathing significantly worsened at 8 AM this morning.  She was scheduled to have a right-sided pleural effusion drained.  EMS placed her on nonrebreather.  She presents the emergency department with altered mental status and respiratory distress and O2 sats of 89% on 15 L nonrebreather.  Patient unable to provide any history   Past Medical History:  Diagnosis Date  . Anxiety   . Arthritis   . Hyperlipidemia   . Hypertension   . Osteoporosis     Patient Active Problem List   Diagnosis Date Noted  . Chronic respiratory failure with hypoxia and hypercapnia (HCC) 03/11/2018  . Prerenal azotemia 03/11/2018  . Pleural effusion on right 02/11/2018  . Unstable gait 12/01/2017  . Senile ecchymosis 12/01/2017  . Venous stasis dermatitis of both lower extremities 05/26/2017  . Irregular heart rate 05/26/2017  . Allergic rhinitis 05/26/2017  . Multiple pulmonary nodules 04/22/2017  . Upper airway cough syndrome 04/08/2017  . Pleural effusion, bilateral 04/07/2017  . DOE (dyspnea on exertion) 04/07/2017  . Morbid obesity due to excess calories (HCC)/ complicated by hbp 03/17/2016  . Anxiety disorder 09/13/2014  . Arthritis of sacroiliac joint 05/01/2009  . Hyperlipidemia 02/02/2007  . Essential hypertension 02/02/2007  . Osteoporosis 02/02/2007    Past Surgical History:  Procedure Laterality Date    . ABDOMINAL HYSTERECTOMY     still has ovaries. Thinks they took cervix. No pap smears  . APPENDECTOMY    . CHOLECYSTECTOMY       OB History   None      Home Medications    Prior to Admission medications   Medication Sig Start Date End Date Taking? Authorizing Provider  ALPRAZolam (XANAX) 0.25 MG tablet Take 1 tablet (0.25 mg total) by mouth daily as needed for anxiety. 09/22/17   [place], Betty G, MD  amLODipine-olmesartan (AZOR) 5-40 MG tablet TAKE 1 TABLET BY MOUTH EVERY DAY 03/01/18   [place], Betty G, MD  aspirin 81 MG tablet Take 81 mg by mouth daily.    [provider]  bisoprolol-hydrochlorothiazide (ZIAC) 5-6.25 MG tablet Take 1 tablet daily by mouth. 05/26/17   [place], Betty G, MD  citalopram (CELEXA) 20 MG tablet Take 1 tablet (20 mg total) by mouth daily. 12/28/17   [place], Betty G, MD  fenoprofen (NALFON) 600 MG TABS tablet Take 600 mg by mouth daily.    [provider]  fish oil-omega-3 fatty acids 1000 MG capsule Take 2 g by mouth daily.    [provider]  fluticasone (FLONASE) 50 MCG/ACT nasal spray Place 2 sprays into both nostrils daily. 01/19/18   [place], Betty G, MD  furosemide (LASIX) 20 MG tablet Take 2 tablets (40 mg total) by mouth daily. 03/01/18   Nyoka Cowden, MD  Horse Chestnut 300 MG CAPS Take 300 mg by mouth 2 (two) times daily.    [provider]  Multiple  Vitamin (MULTIVITAMIN) tablet Take 1 tablet by mouth daily.      [provider]  nystatin (MYCOSTATIN/NYSTOP) powder Apply topically 4 (four) times daily. 03/05/18   [place], Betty G, MD  spironolactone (ALDACTONE) 25 MG tablet Take 1 tablet (25 mg total) by mouth daily. 02/11/18   Glenford BayleyWalsh, Elizabeth W, NP  Vitamin D, Ergocalciferol, (DRISDOL) 50000 units CAPS capsule TAKE 1 CAPSULE EVERY OTHER WEEK 01/15/17   Shelva MajesticHunter, Stephen O, MD    Family History Family History  Problem Relation Age of Onset  . Prostate cancer Father   . Hypertension Father   . Parkinsonism  Brother     Social History Social History   Tobacco Use  . Smoking status: Never Smoker  . Smokeless tobacco: Never Used  Substance Use Topics  . Alcohol use: No  . Drug use: No     Allergies   Patient has no known allergies.   Review of Systems Review of Systems  Unable to perform ROS: Severe respiratory distress     Physical Exam Updated Vital Signs Ht 5\' 2"  (1.575 m)   SpO2 98%   BMI 36.10 kg/m   Physical Exam  Constitutional: She appears well-developed. She appears toxic. She appears ill. She appears distressed.  HENT:  Head: Normocephalic and atraumatic.  Eyes: EOM are normal.  Neck: Normal range of motion. Neck supple. No tracheal deviation present. No thyromegaly present.  Cardiovascular: Regular rhythm.  Tachycardic  Pulmonary/Chest:  Tachypnea.  Accessory muscle use, abdominal breathing, rhonchi bilaterally  Abdominal: Soft. She exhibits no distension.  Musculoskeletal: She exhibits edema.  Rash consistent with candidiasis under bilateral breasts  Neurological:  Opens eyes to voice.  Does not follow simple commands.  Unable to speak.  Skin: Skin is warm and dry.  Psychiatric:  Unable to test  Nursing note and vitals reviewed.    ED Treatments / Results  Labs (all labs ordered are listed, but only abnormal results are displayed) Labs Reviewed  BLOOD GAS, ARTERIAL - Abnormal; Notable for the following components:      Result Value   pH, Arterial 7.337 (*)    pCO2 arterial 55.8 (*)    Bicarbonate 29.1 (*)    Acid-Base Excess 2.8 (*)    All other components within normal limits  I-STAT CG4 LACTIC ACID, ED - Abnormal; Notable for the following components:   Lactic Acid, Venous 2.16 (*)    All other components within normal limits  CULTURE, BLOOD (ROUTINE X 2)  CULTURE, BLOOD (ROUTINE X 2)  CBC WITH DIFFERENTIAL/PLATELET  COMPREHENSIVE METABOLIC PANEL  TROPONIN I  BRAIN NATRIURETIC PEPTIDE    EKG None  Radiology Dg Chest 2  View  Result Date: 03/10/2018 CLINICAL DATA:  F/u rt pleural effusion. Pt c/o dyspnea. Hx of HTN. EXAM: CHEST - 2 VIEW COMPARISON:  02/18/2018 FINDINGS: Large right pleural effusion occupies approximately 60% of the right hemithorax. This has significantly increased from the prior study. There is opacity in the central right upper lobe consistent with atelectasis. The minor fissure is elevated superiorly. Clear left lung.  No left pleural effusion. No pneumothorax. Cardiac silhouette is mildly enlarged. Skeletal structures are demineralized. IMPRESSION: 1. Large right pleural effusion significantly increased when compared to the prior study. There is associated atelectasis at the right lung base and in the central right upper lobe. 2. No convincing pneumonia.  No evidence of pulmonary edema. Electronically Signed   By: Amie Portlandavid  Ormond M.D.   On: 03/10/2018 20:42    Procedures Procedure Name:  Intubation Performed by: Azalia Bilis, MD Pre-anesthesia Checklist: Patient identified, Patient being monitored, Emergency Drugs available, Timeout performed and Suction available Oxygen Delivery Method: Non-rebreather mask Preoxygenation: Pre-oxygenation with 100% oxygen Induction Type: Rapid sequence Ventilation: Mask ventilation without difficulty Laryngoscope Size: Glidescope Grade View: Grade I Tube size: 7.5 mm Number of attempts: 1 Airway Equipment and Method: Video-laryngoscopy Placement Confirmation: ETT inserted through vocal cords under direct vision,  CO2 detector and Breath sounds checked- equal and bilateral    .Critical Care Performed by: Azalia Bilis, MD Authorized by: Azalia Bilis, MD     CRITICAL CARE Performed by: Azalia Bilis Total critical care time: 75 minutes Critical care time was exclusive of separately billable procedures and treating other patients. Critical care was necessary to treat or prevent imminent or life-threatening deterioration. Critical care was time spent  personally by me on the following activities: development of treatment plan with patient and/or surrogate as well as nursing, discussions with consultants, evaluation of patient's response to treatment, examination of patient, obtaining history from patient or surrogate, ordering and performing treatments and interventions, ordering and review of laboratory studies, ordering and review of radiographic studies, pulse oximetry and re-evaluation of patient's condition.   Medications Ordered in ED Medications  propofol (DIPRIVAN) 1000 MG/100ML infusion (has no administration in time range)     Initial Impression / Assessment and Plan / ED Course  I have reviewed the triage vital signs and the nursing notes.  Pertinent labs & imaging results that were available during my care of the patient were reviewed by me and considered in my medical decision making (see chart for details).     Intubated on arrival to the emergency department for respiratory distress and altered mental status and inability to protect airway.  Suspect worsening respiratory failure secondary to large pleural effusion.  Patient will likely need thoracentesis.  ABG pending.  Patient will be admitted to the intensive care unit.  Patient's family will be updated when they arrive.  Labs pending at this time.  Panculture.  3:55 PM Pt developed post intubation hypotension. Will add abx for possible sepsis. Not responding to multiple neosynephrine boluses. Fluids now. Will start levophed. Pt place in trendelenburg. Required 45 minutes at bedside just for pressor use and management her hypotension. Improving now with fluids and additional pressors  Final Clinical Impressions(s) / ED Diagnoses   Final diagnoses:  Acute respiratory failure, unspecified whether with hypoxia or hypercapnia [facility])    ED Discharge Orders    None       Azalia Bilis, MD 02/21/2018 1453    Azalia Bilis, MD 02/23/2018 [phone]

## 2018-03-12 NOTE — ED Triage Notes (Signed)
Per EMS-states patient has been struggling to breath since 0800-had an appointment for have her right pleural effusion drained-non-re-breather placed by EMS

## 2018-03-12 NOTE — ED Notes (Signed)
1423-Etomidate 20 mg and at 1425 Suc 100mg  IV prior to intubation. 1508-40 mcg phenylephrine, 1509-60 mg phenylephrine, 1510- 40 mg phenylephrine, 1513-80 mg phenylephrine, 1515-60 mg phenylephrine, 1531-120 mg phenylephrine, 1533- 120 mg phenylephrine, 1534-120 mg phenylephrine, 1539-100 mcg Fentanyl.

## 2018-03-13 ENCOUNTER — Inpatient Hospital Stay (HOSPITAL_COMMUNITY): Payer: Medicare Other

## 2018-03-13 DIAGNOSIS — R410 Disorientation, unspecified: Secondary | ICD-10-CM

## 2018-03-13 LAB — CREATININE, SERUM
CREATININE: 1.83 mg/dL — AB (ref 0.44–1.00)
GFR calc non Af Amer: 25 mL/min — ABNORMAL LOW (ref >60)
GFR, EST AFRICAN AMERICAN: 28 mL/min — AB (ref >60)

## 2018-03-13 MED ORDER — PROPOFOL 1000 MG/100ML IV EMUL
0.0000 ug/kg/min | INTRAVENOUS | Status: DC
  Administered 2018-03-13: 5 ug/kg/min via INTRAVENOUS

## 2018-03-13 MED ORDER — SODIUM CHLORIDE 0.9 % IV SOLN
2.0000 g | Freq: Two times a day (BID) | INTRAVENOUS | Status: DC
  Administered 2018-03-13 – 2018-03-15 (×4): 2 g via INTRAVENOUS
  Filled 2018-03-13: qty 20
  Filled 2018-03-13: qty 2
  Filled 2018-03-13 (×2): qty 20
  Filled 2018-03-13: qty 2

## 2018-03-13 MED ORDER — PANTOPRAZOLE SODIUM 40 MG PO PACK
40.0000 mg | PACK | Freq: Every day | ORAL | Status: DC
  Administered 2018-03-13 – 2018-03-14 (×2): 40 mg
  Filled 2018-03-13 (×3): qty 20

## 2018-03-13 MED ORDER — DEXTROSE 5 % IV SOLN
10.0000 mg/kg | Freq: Two times a day (BID) | INTRAVENOUS | Status: DC
  Administered 2018-03-13 – 2018-03-14 (×2): 670 mg via INTRAVENOUS
  Filled 2018-03-13 (×4): qty 13.4

## 2018-03-13 MED ORDER — FENTANYL CITRATE (PF) 100 MCG/2ML IJ SOLN
50.0000 ug | INTRAMUSCULAR | Status: DC | PRN
  Administered 2018-03-14 (×4): 50 ug via INTRAVENOUS
  Filled 2018-03-13 (×5): qty 2

## 2018-03-13 MED ORDER — CHLORHEXIDINE GLUCONATE 0.12% ORAL RINSE (MEDLINE KIT)
15.0000 mL | Freq: Two times a day (BID) | OROMUCOSAL | Status: DC
  Administered 2018-03-13 – 2018-03-15 (×5): 15 mL via OROMUCOSAL

## 2018-03-13 MED ORDER — SODIUM CHLORIDE 0.9 % IV SOLN
2.0000 g | Freq: Four times a day (QID) | INTRAVENOUS | Status: DC
  Administered 2018-03-13 – 2018-03-15 (×7): 2 g via INTRAVENOUS
  Filled 2018-03-13 (×4): qty 2000
  Filled 2018-03-13: qty 2
  Filled 2018-03-13 (×5): qty 2000

## 2018-03-13 MED ORDER — MICONAZOLE NITRATE POWD
Freq: Four times a day (QID) | Status: DC
  Administered 2018-03-13: via TOPICAL
  Administered 2018-03-14 (×2): 1 via TOPICAL
  Administered 2018-03-14: 22:00:00 via TOPICAL
  Administered 2018-03-14: 1 via TOPICAL
  Administered 2018-03-15: 10:00:00 via TOPICAL

## 2018-03-13 MED ORDER — SODIUM CHLORIDE 0.9 % IV SOLN
INTRAVENOUS | Status: DC
  Administered 2018-03-13 – 2018-03-14 (×3): via INTRAVENOUS

## 2018-03-13 MED ORDER — ORAL CARE MOUTH RINSE
15.0000 mL | OROMUCOSAL | Status: DC
  Administered 2018-03-13 – 2018-03-15 (×21): 15 mL via OROMUCOSAL

## 2018-03-13 MED ORDER — SODIUM CHLORIDE 0.9 % IV SOLN
200.0000 mg | Freq: Once | INTRAVENOUS | Status: AC
  Administered 2018-03-13: 200 mg via INTRAVENOUS
  Filled 2018-03-13: qty 200

## 2018-03-13 MED ORDER — ENOXAPARIN SODIUM 30 MG/0.3ML ~~LOC~~ SOLN
30.0000 mg | SUBCUTANEOUS | Status: DC
  Administered 2018-03-13: 30 mg via SUBCUTANEOUS
  Filled 2018-03-13: qty 0.3

## 2018-03-13 MED ORDER — FENTANYL CITRATE (PF) 100 MCG/2ML IJ SOLN
50.0000 ug | INTRAMUSCULAR | Status: DC | PRN
  Administered 2018-03-13 – 2018-03-14 (×2): 50 ug via INTRAVENOUS
  Filled 2018-03-13: qty 2

## 2018-03-13 MED ORDER — SODIUM CHLORIDE 0.9 % IV SOLN
100.0000 mg | INTRAVENOUS | Status: DC
  Administered 2018-03-14: 100 mg via INTRAVENOUS
  Filled 2018-03-13 (×2): qty 100

## 2018-03-13 MED ORDER — VANCOMYCIN HCL 500 MG IV SOLR
500.0000 mg | INTRAVENOUS | Status: DC
  Administered 2018-03-13 – 2018-03-14 (×2): 500 mg via INTRAVENOUS
  Filled 2018-03-13 (×3): qty 500

## 2018-03-13 MED ORDER — SODIUM CHLORIDE 0.9 % IV SOLN
Freq: Once | INTRAVENOUS | Status: AC
  Administered 2018-03-13: 12:00:00 via INTRAVENOUS

## 2018-03-13 MED ORDER — PROPOFOL 1000 MG/100ML IV EMUL
INTRAVENOUS | Status: AC
  Filled 2018-03-13: qty 100

## 2018-03-13 MED ORDER — MIDAZOLAM HCL 2 MG/2ML IJ SOLN
1.0000 mg | INTRAMUSCULAR | Status: DC | PRN
  Administered 2018-03-13: 1 mg via INTRAVENOUS
  Filled 2018-03-13: qty 2

## 2018-03-13 MED ORDER — MIDAZOLAM HCL 2 MG/2ML IJ SOLN
1.0000 mg | INTRAMUSCULAR | Status: DC | PRN
  Administered 2018-03-14: 1 mg via INTRAVENOUS
  Filled 2018-03-13: qty 2

## 2018-03-13 MED ORDER — NOREPINEPHRINE 16 MG/250ML-% IV SOLN
0.0000 ug/min | INTRAVENOUS | Status: DC
  Administered 2018-03-13: 34 ug/min via INTRAVENOUS
  Administered 2018-03-13: 28 ug/min via INTRAVENOUS
  Administered 2018-03-14: 25 ug/min via INTRAVENOUS
  Administered 2018-03-14: 28 ug/min via INTRAVENOUS
  Administered 2018-03-15: 26 ug/min via INTRAVENOUS
  Filled 2018-03-13 (×7): qty 250

## 2018-03-13 MED ORDER — ASPIRIN 81 MG PO CHEW
81.0000 mg | CHEWABLE_TABLET | Freq: Every day | ORAL | Status: DC
  Administered 2018-03-13: 81 mg
  Filled 2018-03-13 (×2): qty 1

## 2018-03-13 NOTE — Procedures (Signed)
Central Venous Catheter Insertion Procedure Note Jenny [name] [id number] 09/30/1933  Procedure: Insertion of Central Venous Catheter Indications: Drug and/or fluid administration  Procedure Details Consent: Risks of procedure as well as the alternatives and risks of each were explained to the (patient/caregiver).  Consent for procedure obtained. Time Out: Verified patient identification, verified procedure, site/side was marked, verified correct patient position, special equipment/implants available, medications/allergies/relevent history reviewed, required imaging and test results available.  Performed  Maximum sterile technique was used including cap, gloves, gown, hand hygiene, mask and sheet. Skin prep: Chlorhexidine; local anesthetic administered A antimicrobial bonded/coated triple lumen catheter was placed in the left internal jugular vein using the Seldinger technique.  Evaluation Blood flow good Complications: No apparent complications Patient did tolerate procedure well. Chest X-ray ordered to verify placement.  CXR: pending.  Adewale A Olalere 03/13/2018, 11:26 AM

## 2018-03-13 NOTE — Progress Notes (Signed)
[facility] transferred pt to [place] via stretcher, pt remains on vent. Levophed and normal saline transfusing. Foley catheter, ART line, and CVC in place. Pt medicated with 1 mg of versed prior to transfer to control agitation.

## 2018-03-13 NOTE — Progress Notes (Signed)
Jenny CoderMary A Viscomi  WUJ:[id number] DOB: 02/17/1934 DOA: 03/01/2018 PCP: [place], Betty G, MD    Reason for Consult / Chief Complaint:  Shortness of breath Respiratory failure  Consulting MD:  Dr. Patria Maneampos  HPI/Brief Narrative   Patient known to Dr. Sherene SiresWert who had been scheduled for thoracentesis today Thoracentesis was not performed today Her breathing worsened significantly today Required high flow oxygen brought into the emergency room She decompensated in the emergency room and not to be intubated In the ICU, requiring pressors On the ventilator  Assessment & Plan:  1.  Hypoxemic respiratory failure -Likely related to a large right pleural effusion-post thoracentesis for 1700 cc of clear fluid  - 2.  Large right pleural effusion -Previous thoracentesis had cytology negative-performed 02/18/2018 for 2 L -Felt to be related to heart failure -Pleural fluid is transudative  3.  Acute kidney injury -Continue to monitor electrolytes -We will fluid resuscitate  4.  Sepsis -Present on admission -Continue antibiotics-on Zosyn and vancomycin -We will obtain cultures -We will start antifungal as she has a macerated lesion on that breast Eraxis added  5..  History of pulmonary nodule - to be followed as needed  6.  Morbid obesity  7.  Hyperkalemia -Likely related to acute kidney injury  Best practice/Goals of care/disposition.   DVT prophylaxis: Enoxaparin GI prophylaxis: Protonix Diet: N.p.o. at present Mobility: Bedrest Code Status: Full code Family Communication: No family present  Disposition / Summary of Today's Plan 03/13/18   We will continue to stabilize She is on full vent support at the present time-FiO2 being weaned and tolerating it well Requiring pressors-still on Levophed-not to increase to 28 mcg   Procedures: Thoracentesis Had 1700 cc drained from the right pleural space -Pleural fluid is transudate of, no organisms  Significant Diagnostic Tests: Lab  data from thoracentesis pending  Micro Data: Cultures from thoracentesis pending Blood culture on 823- to date  Antimicrobials:  Empirically started on vancomycin and Zosyn Vancomycin 8/23 >> Zosyn 8/23 >> Eraxis 8/24 >>  Subjective  Elderly lady with large pleural effusion came in with respiratory failure Decompensated in the emergency department and not to be intubated 8/24-pressors are to be increased -FiO2 is improving -Central line placed for pressors  Objective    Examination: General: Elderly lady, sedated on vent  ENT: Moist oral mucosa Lungs: Decreased air entry bilaterally Cardiovascular: S1-S2 appreciated Abdomen: Bowel sounds appreciated, nontender Extremities: No edema Neuro: Sedate GU: Urine output is fair   Blood pressure (!) 96/56, pulse 64, temperature 98.5 F (36.9 C), temperature source Oral, resp. rate 18, height 5\' 2"  (1.575 m), weight 92.1 kg, SpO2 99 %.    Vent Mode: PRVC FiO2 (%):  [50 %-100 %] 50 % Set Rate:  [18 bmp] 18 bmp Vt Set:  [400 mL] 400 mL PEEP:  [5 cmH20] 5 cmH20 Plateau Pressure:  [16 cmH20-22 cmH20] 16 cmH20   Intake/Output Summary (Last 24 hours) at 03/13/2018 1124 Last data filed at 03/13/2018 1000 Gross per 24 hour  Intake 2295.22 ml  Output 970 ml  Net 1325.22 ml   Filed Weights   03/11/2018 1806 03/13/18 0500  Weight: 89.6 kg 92.1 kg     Labs   CBC: Recent Labs  Lab 03/10/18 1545 02/25/2018 1447 03/17/2018 1943  WBC 7.3 12.2* 16.9*  NEUTROABS 5.4 8.9*  --   HGB 12.5 12.8 13.3  HCT 38.9 42.2 43.9  MCV 90.4 97.2 96.1  PLT 316.0 384 453*   Basic Metabolic Panel:  Recent Labs  Lab 03/10/18 1545 03/27/18 1714 03/13/18 0329  NA 140 145  --   K 4.7 5.7*  --   CL 97 101  --   CO2 35* 31  --   GLUCOSE 77 103*  --   BUN 48* 62*  --   CREATININE 1.31* 1.96* 1.83*  CALCIUM 9.8 8.9  --    GFR: Estimated Creatinine Clearance: 25 mL/min (A) (by C-G formula based on SCr of 1.83 mg/dL (H)). Recent Labs  Lab  03/10/18 1545 03-27-2018 1447 2018/03/27 1504 03-27-18 1722 27-Mar-2018 1943  WBC 7.3 12.2*  --   --  16.9*  LATICACIDVEN  --   --  2.16* 1.83  --    Liver Function Tests: Recent Labs  Lab 03/10/18 1545 03-27-2018 1714  AST 27 54*  ALT 17 27  ALKPHOS 60 60  BILITOT 1.2 2.1*  PROT 6.9 6.7  ALBUMIN 3.2* 3.0*   No results for input(s): LIPASE, AMYLASE in the last 168 hours. No results for input(s): AMMONIA in the last 168 hours. ABG    Component Value Date/Time   PHART 7.337 (L) 03/27/18 1510   PCO2ART 55.8 (H) Mar 27, 2018 1510   PO2ART 89.1 03/27/2018 1510   HCO3 29.1 (H) Mar 27, 2018 1510   O2SAT 95.7 03-27-18 1510    Coagulation Profile: No results for input(s): INR, PROTIME in the last 168 hours. Cardiac Enzymes: Recent Labs  Lab 03-27-2018 1447  TROPONINI <0.03   HbA1C: No results found for: HGBA1C CBG: No results for input(s): GLUCAP in the last 168 hours.   Review of Systems:     Past medical history   Past Medical History:  Diagnosis Date  . Anxiety   . Arthritis   . Hyperlipidemia   . Hypertension   . Osteoporosis    Social History   Social History   Socioeconomic History  . Marital status: Widowed    Spouse name: Not on file  . Number of children: Not on file  . Years of education: Not on file  . Highest education level: Not on file  Occupational History  . Not on file  Social Needs  . Financial resource strain: Not on file  . Food insecurity:    Worry: Not on file    Inability: Not on file  . Transportation needs:    Medical: Not on file    Non-medical: Not on file  Tobacco Use  . Smoking status: Never Smoker  . Smokeless tobacco: Never Used  Substance and Sexual Activity  . Alcohol use: No  . Drug use: No  . Sexual activity: Not Currently  Lifestyle  . Physical activity:    Days per week: Not on file    Minutes per session: Not on file  . Stress: Not on file  Relationships  . Social connections:    Talks on phone: Not on file     Gets together: Not on file    Attends religious service: Not on file    Active member of club or organization: Not on file    Attends meetings of clubs or organizations: Not on file    Relationship status: Not on file  . Intimate partner violence:    Fear of current or ex partner: Not on file    Emotionally abused: Not on file    Physically abused: Not on file    Forced sexual activity: Not on file  Other Topics Concern  . Not on file  Social History Narrative  Widowed 2012. 2 children (lost 3rd son unknown cause at 43). 9 grandchildren. 2 greatgrandchildren.    Lives alone. Does everything for herself except yardwork. 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       Hobbies: day trips with [facility], time with family, enjoys meal gatherings    Family history    Family History  Problem Relation Age of Onset  . Prostate cancer Father   . Hypertension Father   . Parkinsonism Brother     Allergies No Known Allergies  Home meds  Prior to Admission medications   Medication Sig Start Date End Date Taking? Authorizing Provider  ALPRAZolam (XANAX) 0.25 MG tablet Take 1 tablet (0.25 mg total) by mouth daily as needed for anxiety. 09/22/17   [place], Betty G, MD  amLODipine-olmesartan (AZOR) 5-40 MG tablet TAKE 1 TABLET BY MOUTH EVERY DAY 03/01/18   [place], Betty G, MD  aspirin 81 MG tablet Take 81 mg by mouth daily.    [provider]  bisoprolol-hydrochlorothiazide (ZIAC) 5-6.25 MG tablet Take 1 tablet daily by mouth. 05/26/17   [place], Betty G, MD  citalopram (CELEXA) 20 MG tablet Take 1 tablet (20 mg total) by mouth daily. 12/28/17   [place], Betty G, MD  fenoprofen (NALFON) 600 MG TABS tablet Take 600 mg by mouth daily.    [provider]  fish oil-omega-3 fatty acids 1000 MG capsule Take 2 g by mouth daily.    [provider]  fluticasone (FLONASE) 50 MCG/ACT nasal spray  Place 2 sprays into both nostrils daily. 01/19/18   [place], Betty G, MD  furosemide (LASIX) 20 MG tablet Take 2 tablets (40 mg total) by mouth daily. 03/01/18   Nyoka Cowden, MD  Horse Chestnut 300 MG CAPS Take 300 mg by mouth 2 (two) times daily.    [provider]  Multiple Vitamin (MULTIVITAMIN) tablet Take 1 tablet by mouth daily.      [provider]  nystatin (MYCOSTATIN/NYSTOP) powder Apply topically 4 (four) times daily. 03/05/18   [place], Betty G, MD  spironolactone (ALDACTONE) 25 MG tablet Take 1 tablet (25 mg total) by mouth daily. 02/11/18   Glenford Bayley, NP  Vitamin D, Ergocalciferol, (DRISDOL) 50000 units CAPS capsule TAKE 1 CAPSULE EVERY OTHER WEEK 01/15/17   Shelva Majestic, MD     LOS: 1 day   Care time spent evaluating and reviewing records, from late and plan of care 30 minutes

## 2018-03-13 NOTE — Progress Notes (Signed)
Obtained consent for LP over the telephone from patient's son Jenny ItoWilliam Russell [name] ([phone]), who is her health care power of attorney. Risks were discussed including intraspinal hemorrhage and nerve root damage. Benefits primarily the diagnostic information that would be used to guide treatment. Jenny [name] expressed understanding and consented to the LP on behalf of his mother. Consent witnessed by Jenny [name], Charity fundraiserN. LP to be attempted in the AM after 12 hours elapsed from last Lovenox dose.   Electronically signed: Dr. Caryl PinaEric Aruna Nestler

## 2018-03-13 NOTE — Consult Note (Addendum)
Neurology Consultation  Reason for Consult: Nuchal rigidity Referring Physician: Dr. Ander Slade  CC:  Lack of response post sedation/ nuchal rigidity   History is obtained from:Chart review/ Daughter at bedside   HPI: Jenny [name] is a 84 y.o. female with a PMH of HTN, HLD, anxiety, allergic rhinitis, chronic leg swelling, pleural effusion (followed by outpatient pulmonary Dr. Volanda Napoleon) presented on 03/04/2018 with chief complaint SOB, DOE.  She recently presented to pulmonary office 7/24 with worsening symptoms, 02 desaturation mid 80's and leg swelling that had worsened. She was continued on previously prescribed Lasix 40 mg and set up for DME home 02. Patient was scheduled for 8/1 for thoracentesis; ultrasound guided on right side. Per report a total of 2L of clear gold fluid was removed; cytology was neg. CXR confirmed improvement post procedural. Patient reported improvement of symptoms. On 8/7 patient's niece reported decrease in UOP, SOB and continuation of leg swelling. She was ordered by pulmonary office to take an immediate dose of her lasix 20 mg, and continue with home 02. 8/19 patient/family reported to the office that patient had fell on floor and was there overnight until found by family, had poor UOP, generalized weakness, and decrease in appetite. Patient was brought back into office. On 8/21 Chest XR revealed large right pleural effusion, significantly increased when compared to the prior study with associated atelectasis at the right lung base and in the central right upper lobe.Patient was given instructions of continuing diuresis and to return for repeat thoracentesis and echo to determine if CHF was the precipitant.    Patient was unable to complete outpatient thoracentesis, but instead presented to ER with respiratory distress. Shortly after she required intubation d/t hypoxia and resp failure. She was found to be septic, was initiated on pressors and Abx treatment Vanc, and Zosyn. She  underwent another thoracentesis with removal of 1700 ml's of fluids. Her hospitalization was complicated by AKI with Cr 1.96, and subsequent hyperkalemia today at 5.7.  Patient was taken off sedation this morning at approximately 0800 and has not appeared to be interactive or purposeful since that time per nursing. In addition patient displayed  nuchal rigidity, prompting neurology consult. CT brain and LP pending completion and medications are initiated for possible meningitis.  ROS:  Unable to obtain due to altered mental status.   Past Medical History:  Diagnosis Date  . Anxiety   . Arthritis   . Hyperlipidemia   . Hypertension   . Osteoporosis     Family History  Problem Relation Age of Onset  . Prostate cancer Father   . Hypertension Father   . Parkinsonism Brother     Social History:   reports that she has never smoked. She has never used smokeless tobacco. She reports that she does not drink alcohol or use drugs.  Medications  Current Facility-Administered Medications:  .  0.9 %  sodium chloride infusion, 250 mL, Intravenous, PRN, Ander Slade, Adewale A, MD, Stopped at 03/13/18 1206 .  0.9 %  sodium chloride infusion, , Intravenous, Continuous, Olalere, Adewale A, MD, Last Rate: 50 mL/hr at 03/13/18 1600 .  ALPRAZolam (XANAX) tablet 0.25 mg, 0.25 mg, Oral, Daily PRN, Olalere, Adewale A, MD, 0.25 mg at 03/03/2018 2214 .  [COMPLETED] anidulafungin (ERAXIS) 200 mg in sodium chloride 0.9 % 200 mL IVPB, 200 mg, Intravenous, Once, Stopped at 03/13/18 1527 **FOLLOWED BY** [START ON 03/14/2018] anidulafungin (ERAXIS) 100 mg in sodium chloride 0.9 % 100 mL IVPB, 100 mg, Intravenous, Q24H, Olalere, Adewale A,  MD .  aspirin chewable tablet 81 mg, 81 mg, Per Tube, Daily, Olalere, Adewale A, MD, 81 mg at 03/13/18 0942 .  chlorhexidine gluconate (MEDLINE KIT) (PERIDEX) 0.12 % solution 15 mL, 15 mL, Mouth Rinse, BID, Olalere, Adewale A, MD, 15 mL at 03/13/18 0755 .  citalopram (CELEXA) tablet 20 mg,  20 mg, Oral, Daily, Olalere, Adewale A, MD, 20 mg at 03/13/18 0942 .  enoxaparin (LOVENOX) injection 30 mg, 30 mg, Subcutaneous, Q24H, Olalere, Adewale A, MD .  feeding supplement (VITAL AF 1.2 CAL) liquid 1,000 mL, 1,000 mL, Per Tube, Q24H, Olalere, Adewale A, MD, Last Rate: 30 mL/hr at 02/22/2018 2107, 1,000 mL at 03/11/2018 2107 .  MEDLINE mouth rinse, 15 mL, Mouth Rinse, 10 times per day, Olalere, Adewale A, MD, 15 mL at 03/13/18 1513 .  midazolam (VERSED) 50 mg in sodium chloride 0.9 % 50 mL (1 mg/mL) infusion, 1-2 mg/hr, Intravenous, Continuous, Olalere, Adewale A, MD, Stopped at 03/13/18 0834 .  norepinephrine (LEVOPHED) 16 mg in D5W 233m premix infusion, 0-40 mcg/min, Intravenous, Titrated, Olalere, Adewale A, MD, Last Rate: 29.1 mL/hr at 03/13/18 1600, 31 mcg/min at 03/13/18 1600 .  nystatin (MYCOSTATIN/NYSTOP) topical powder, , Topical, QID, Olalere, Adewale A, MD .  pantoprazole sodium (PROTONIX) 40 mg/20 mL oral suspension 40 mg, 40 mg, Per Tube, Daily, Olalere, Adewale A, MD, 40 mg at 03/13/18 0942 .  piperacillin-tazobactam (ZOSYN) IVPB 3.375 g, 3.375 g, Intravenous, Q8H, Green, Terri L, RPH, Last Rate: 12.5 mL/hr at 03/13/18 1600 .  vancomycin (VANCOCIN) IVPB 750 mg/150 ml premix, 750 mg, Intravenous, Q48H, GMinda Ditto RPH   Exam: Current vital signs: BP 110/63   Pulse 69   Temp 99 F (37.2 C) (Axillary)   Resp (!) 21   Ht 5' 2" (1.575 m)   Wt 92.1 kg   SpO2 97%   BMI 37.14 kg/m  Vital signs in last 24 hours: Temp:  [98.4 F (36.9 C)-99 F (37.2 C)] 99 F (37.2 C) (08/24 1552) Pulse Rate:  [52-80] 69 (08/24 1630) Resp:  [16-28] 21 (08/24 1630) BP: (57-143)/(29-109) 110/63 (08/24 1600) SpO2:  [66 %-100 %] 97 % (08/24 1630) Arterial Line BP: (70-121)/(35-68) 109/58 (08/24 1630) FiO2 (%):  [40 %-100 %] 40 % (08/24 1507) Weight:  [89.6 kg-92.1 kg] 92.1 kg (08/24 0500)  GENERAL:  Lethargic- awakens to name calling  HEENT: - Normocephalic and atraumatic, ETT present   Neck- Does appear to be stiff with forward flexion nuchal rigidity, lateral flexion less rigid but also with increased tone to left and right LUNGS - Ventilated CV - RRR,  ABDOMEN - Soft, nontender Ext: warm,  generalized edema. R ext appears worse than left   Neurological Exam:  Mental Status: Opens eyes to voice, appeared to follow command of squeezing right hand, otherwise does not follow commands  Cranial Nerves: PERRL 37mbrisk. Does not blink to threat but will become agitated with attempt to elicit pupillary light reflex with penlight. Spontaneous eye movement present with initial NP exam, but midline and without movement spontaneously or with oculocephalic maneuver. On both exams she is resistant to eye being forcefully opened  Gag reflex intact on w/ inline cath introduction No apparent facial weakness patient is with tube but no NLF  Motor/Sensory: Responds with withdrawal and semipurposeful flailing movements throughout to noxious stimuli without asymmetry Reflexes: Upper extremities difficult to elicit due to edema. 4+ patellae with crossed adductors bilaterally. Toes equivocal bilaterally. Other: Intermittent low amplitude twitching of limbs including ankles and toes  are non-rhythmic and appear more consistent with agitation than seizure.   Labs I have reviewed labs in epic and the results pertinent to this consultation are: Blood cultures pending  WBC 16.9 leukocytosis Blood gas PH 7.33, PCO2 55.8, Hc03 29.1 BNP 854 K 5.7, Cr 1.83  CBC    Component Value Date/Time   WBC 16.9 (H) 02/21/2018 1943   RBC 4.57 03/20/2018 1943   HGB 13.3 03/06/2018 1943   HCT 43.9 03/11/2018 1943   PLT 453 (H) 02/26/2018 1943   MCV 96.1 03/10/2018 1943   MCH 29.1 03/16/2018 1943   MCHC 30.3 03/05/2018 1943   RDW 16.9 (H) 02/22/2018 1943   LYMPHSABS 0.9 03/04/2018 1447   MONOABS 2.2 (H) 02/28/2018 1447   EOSABS 0.1 03/18/2018 1447   BASOSABS 0.0 02/26/2018 1447    CMP     Component  Value Date/Time   NA 145 02/28/2018 1714   K 5.7 (H) 03/09/2018 1714   CL 101 03/17/2018 1714   CO2 31 03/16/2018 1714   GLUCOSE 103 (H) 02/22/2018 1714   BUN 62 (H) 02/26/2018 1714   CREATININE 1.83 (H) 03/13/2018 0329   CALCIUM 8.9 03/04/2018 1714   PROT 6.7 02/20/2018 1714   ALBUMIN 3.0 (L) 02/22/2018 1714   AST 54 (H) 03/11/2018 1714   ALT 27 03/17/2018 1714   ALKPHOS 60 02/28/2018 1714   BILITOT 2.1 (H) 03/18/2018 1714   GFRNONAA 25 (L) 03/13/2018 0329   GFRAA 28 (L) 03/13/2018 0329    Lipid Panel     Component Value Date/Time   CHOL 170 03/31/2016 0932   TRIG 193.0 (H) 03/31/2016 0932   HDL 34.80 (L) 03/31/2016 0932   CHOLHDL 5 03/31/2016 0932   VLDL 38.6 03/31/2016 0932   LDLCALC 97 03/31/2016 0932   LDLDIRECT 94.4 07/29/2013 1205     Imaging I have reviewed the images obtained:  CT-scan of the brain 03/13/18 1. Mild atrophy and white matter changes are likely within normal limits for age. 2. No acute or focal lesion to explain mental status changes or unresponsiveness.   Assessment:  84 y.o. female with a PMH of HTN, HLD, anxiety, allergic rhinitis, chronic leg swelling, pleural effusion (followed by outpatient pulmonary Dr. Volanda Napoleon) presented on 02/22/2018 with chief complaint SOB, DOE. She decompensated requiring intubation and ventilation. Patient was treated with Pressors and Abx for sepsis, underwent inpatient thoracentesis. Sedation was removed earlier in am, now patient without interaction on exam, and is with nuchal rigidity.   1. CT head without acute abnormality. Mild atrophy is noted.  2. Exam best localizes as bihemispheric dysfunction with some awareness to external stimuli, but obtunded.  3. Intermittent rapid low amplitude movements of limbs are more consistent with agitation than seizure. However, EEG will be obtained to fully rule out nonconvulsive status.  4. Suspicion for meningitis. Nuchal rigidity, AMS, fever and leukocytosis increase the clinical  suspicion for meningitis. However, septic encephalopathy with/without a metabolic component is also possible. Of note, patient appears to be somewhat improved on exam per nursing and relative to documentation of previous findings, as she is awakening to name calling, as well as showing some semipurposeful movements on her exam without asymmetry. There is some possibility that patient is delayed w/ metabolizing Pain/seadtion medication, and as it clears along with treatment of her metabolic needs there may be further improvement.    Recommendations: - LP for suspected infection now with nuchal rigidity. CT without findings to contraindicate LP.  However, just received Lovenox at 7:50  PM and normally would wait 12 hours prior to LP after receiving prophylactic Lovenox. Would attempt LP in the AM after Lovenox has cleared her system. Additionally, family unavailable by phone to provide consent at this time.   - Ampicillin, rocephin, acyclovir and vancomycin w/ pharm dosing for possible meningitis  - EEG- routine- mild twitching noted prior to exam initiation. EEG  -Try to avoid giving patient sedatives in order to obtain a fully awake assessment    _0 @ Lilyan Gilford, Neurology NP  50 minutes spent in the neurological evaluation and management of this critically ill patient. Time spent included coordination of care.   I have seen and examined the patient. I have amended the assessment and recommendations above, in addition to follow up findings on her exam. Electronically signed: Dr. Kerney Elbe   I have seen and

## 2018-03-13 NOTE — Progress Notes (Signed)
EEG complete - results pending 

## 2018-03-13 NOTE — Progress Notes (Signed)
Pharmacy Antibiotic Note  Jenny CoderMary A [name] is a 84 y.o. female with hx pleural effusion requiring intermittent thoracentesis, presented to the ED on 2017-10-07 with SOB and AMS.  Patient was started on broad abx with vancomycin and zosyn on admission for empiric coverage.  Per Dr. Wynona Neatlalere on 8/24, neurology team recommends to start abx for meningitis coverage - To start acyclovir and ampicillin, change zosyn to ceftriaxone, continue vancomycin. - LP pending.  - scr down slightly to 1.83 (crcl~25)  Plan: - d/c zosyn - Ceftriaxone 2gm IV q12h - ampicillin 2gm IV q6h (dose adjusted for crcl 10-50) - acyclovir 10 mg/kg q12h (using ABW for BMI >30)  - change vancomycin to 500 mg q24h for est AUC 583 (goal 550-600 for meningitis) - monitor renal function closely  - anidulafungin  200 mg x1, then 100 mg daily per MD dosing ___________________________________  Height: 5\' 2"  (157.5 cm) Weight: 203 lb 0.7 oz (92.1 kg) IBW/kg (Calculated) : 50.1  Temp (24hrs), Avg:98.7 F (37.1 C), Min:98.4 F (36.9 C), Max:99 F (37.2 C)  Recent Labs  Lab 03/10/18 1545 09/11/2017 1447 09/11/2017 1504 09/11/2017 1714 09/11/2017 1722 09/11/2017 1943 03/13/18 0329  WBC 7.3 12.2*  --   --   --  16.9*  --   CREATININE 1.31*  --   --  1.96*  --   --  1.83*  LATICACIDVEN  --   --  2.16*  --  1.83  --   --     Estimated Creatinine Clearance: 25 mL/min (A) (by C-G formula based on SCr of 1.83 mg/dL (H)).    No Known Allergies   Thank you for allowing pharmacy to be a part of this patient's care.  Lucia Gaskinsham, Dariya Gainer P 03/13/2018 4:46 PM

## 2018-03-13 NOTE — Progress Notes (Signed)
Report called to Hunt[place] Dec[place]ara, [place], [facility].

## 2018-03-13 NOTE — Progress Notes (Signed)
Called regarding rigidity  Has been off sedation since 0800  Not interactive, concern with nuchal rigidity  Consulted neurology  Ordered CT head  Started on medications for possible meningitis ampicillin, rocephin, acyclovir and vancomycin

## 2018-03-13 NOTE — Progress Notes (Signed)
Pharmacy: Re- Levophed  Per request from Sarah (pt's RN), Levophed changed to quad. strength now that patient has central line.   Dorna LeitzAnh Sion Thane, PharmD, BCPS 03/13/2018 11:36 AM

## 2018-03-14 ENCOUNTER — Inpatient Hospital Stay (HOSPITAL_COMMUNITY): Payer: Medicare Other

## 2018-03-14 ENCOUNTER — Encounter (HOSPITAL_COMMUNITY): Payer: Self-pay | Admitting: Neurology

## 2018-03-14 DIAGNOSIS — N6489 Other specified disorders of breast: Secondary | ICD-10-CM

## 2018-03-14 DIAGNOSIS — L8990 Pressure ulcer of unspecified site, unspecified stage: Secondary | ICD-10-CM

## 2018-03-14 DIAGNOSIS — A419 Sepsis, unspecified organism: Principal | ICD-10-CM

## 2018-03-14 DIAGNOSIS — J90 Pleural effusion, not elsewhere classified: Secondary | ICD-10-CM

## 2018-03-14 DIAGNOSIS — Z8249 Family history of ischemic heart disease and other diseases of the circulatory system: Secondary | ICD-10-CM

## 2018-03-14 DIAGNOSIS — Z9911 Dependence on respirator [ventilator] status: Secondary | ICD-10-CM

## 2018-03-14 DIAGNOSIS — I10 Essential (primary) hypertension: Secondary | ICD-10-CM

## 2018-03-14 DIAGNOSIS — J9600 Acute respiratory failure, unspecified whether with hypoxia or hypercapnia: Secondary | ICD-10-CM

## 2018-03-14 DIAGNOSIS — Z66 Do not resuscitate: Secondary | ICD-10-CM

## 2018-03-14 DIAGNOSIS — G9340 Encephalopathy, unspecified: Secondary | ICD-10-CM

## 2018-03-14 DIAGNOSIS — R6521 Severe sepsis with septic shock: Secondary | ICD-10-CM

## 2018-03-14 DIAGNOSIS — F419 Anxiety disorder, unspecified: Secondary | ICD-10-CM

## 2018-03-14 DIAGNOSIS — J9601 Acute respiratory failure with hypoxia: Secondary | ICD-10-CM

## 2018-03-14 DIAGNOSIS — G039 Meningitis, unspecified: Secondary | ICD-10-CM

## 2018-03-14 DIAGNOSIS — R4182 Altered mental status, unspecified: Secondary | ICD-10-CM

## 2018-03-14 DIAGNOSIS — D72829 Elevated white blood cell count, unspecified: Secondary | ICD-10-CM

## 2018-03-14 DIAGNOSIS — J9611 Chronic respiratory failure with hypoxia: Secondary | ICD-10-CM

## 2018-03-14 LAB — COMPREHENSIVE METABOLIC PANEL
ALBUMIN: 2 g/dL — AB (ref 3.5–5.0)
ALT: 17 U/L (ref 0–44)
ANION GAP: 7 (ref 5–15)
AST: 30 U/L (ref 15–41)
Alkaline Phosphatase: 51 U/L (ref 38–126)
BILIRUBIN TOTAL: 1.4 mg/dL — AB (ref 0.3–1.2)
BUN: 41 mg/dL — ABNORMAL HIGH (ref 8–23)
CO2: 29 mmol/L (ref 22–32)
Calcium: 8 mg/dL — ABNORMAL LOW (ref 8.9–10.3)
Chloride: 100 mmol/L (ref 98–111)
Creatinine, Ser: 1.52 mg/dL — ABNORMAL HIGH (ref 0.44–1.00)
GFR calc Af Amer: 36 mL/min — ABNORMAL LOW (ref >60)
GFR calc non Af Amer: 31 mL/min — ABNORMAL LOW (ref >60)
GLUCOSE: 127 mg/dL — AB (ref 70–99)
POTASSIUM: 4.1 mmol/L (ref 3.5–5.1)
Sodium: 136 mmol/L (ref 135–145)
TOTAL PROTEIN: 5.3 g/dL — AB (ref 6.5–8.1)

## 2018-03-14 LAB — CBC
HCT: 39.9 % (ref 36.0–46.0)
HEMOGLOBIN: 12.4 g/dL (ref 12.0–15.0)
MCH: 28.4 pg (ref 26.0–34.0)
MCHC: 31.1 g/dL (ref 30.0–36.0)
MCV: 91.3 fL (ref 78.0–100.0)
Platelets: 355 10*3/uL (ref 150–400)
RBC: 4.37 MIL/uL (ref 3.87–5.11)
RDW: 17.6 % — ABNORMAL HIGH (ref 11.5–15.5)
WBC: 16.8 10*3/uL — AB (ref 4.0–10.5)

## 2018-03-14 LAB — BLOOD CULTURE ID PANEL (REFLEXED)
ACINETOBACTER BAUMANNII: NOT DETECTED
CANDIDA ALBICANS: NOT DETECTED
CANDIDA PARAPSILOSIS: NOT DETECTED
CANDIDA TROPICALIS: NOT DETECTED
Candida glabrata: NOT DETECTED
Candida krusei: NOT DETECTED
ENTEROBACTER CLOACAE COMPLEX: NOT DETECTED
ENTEROBACTERIACEAE SPECIES: NOT DETECTED
Enterococcus species: NOT DETECTED
Escherichia coli: NOT DETECTED
Haemophilus influenzae: NOT DETECTED
KLEBSIELLA OXYTOCA: NOT DETECTED
KLEBSIELLA PNEUMONIAE: NOT DETECTED
Listeria monocytogenes: NOT DETECTED
Neisseria meningitidis: NOT DETECTED
Proteus species: NOT DETECTED
Pseudomonas aeruginosa: NOT DETECTED
STREPTOCOCCUS AGALACTIAE: NOT DETECTED
Serratia marcescens: NOT DETECTED
Staphylococcus aureus (BCID): NOT DETECTED
Staphylococcus species: NOT DETECTED
Streptococcus pneumoniae: NOT DETECTED
Streptococcus pyogenes: NOT DETECTED
Streptococcus species: NOT DETECTED

## 2018-03-14 LAB — GLUCOSE, CSF: Glucose, CSF: 72 mg/dL — ABNORMAL HIGH (ref 40–70)

## 2018-03-14 LAB — CREATININE, SERUM
Creatinine, Ser: 1.68 mg/dL — ABNORMAL HIGH (ref 0.44–1.00)
GFR calc Af Amer: 32 mL/min — ABNORMAL LOW (ref >60)
GFR calc non Af Amer: 27 mL/min — ABNORMAL LOW (ref >60)

## 2018-03-14 LAB — PHOSPHORUS: PHOSPHORUS: 3 mg/dL (ref 2.5–4.6)

## 2018-03-14 LAB — PROTIME-INR
INR: 1.44
Prothrombin Time: 17.4 seconds — ABNORMAL HIGH (ref 11.4–15.2)

## 2018-03-14 LAB — GLUCOSE, CAPILLARY
GLUCOSE-CAPILLARY: 119 mg/dL — AB (ref 70–99)
GLUCOSE-CAPILLARY: 120 mg/dL — AB (ref 70–99)
GLUCOSE-CAPILLARY: 100 mg/dL — AB (ref 70–99)
Glucose-Capillary: 102 mg/dL — ABNORMAL HIGH (ref 70–99)

## 2018-03-14 LAB — PROTEIN, CSF: Total  Protein, CSF: 45 mg/dL (ref 15–45)

## 2018-03-14 MED ORDER — ENOXAPARIN SODIUM 30 MG/0.3ML ~~LOC~~ SOLN: 30.0000 mg | SUBCUTANEOUS | Status: DC

## 2018-03-14 MED ORDER — LIDOCAINE HCL (PF) 1 % IJ SOLN
INTRAMUSCULAR | Status: AC
  Filled 2018-03-14: qty 10

## 2018-03-14 NOTE — Progress Notes (Signed)
PHARMACY - PHYSICIAN COMMUNICATION CRITICAL VALUE ALERT - BLOOD CULTURE IDENTIFICATION (BCID)  Jenny [name] is an 84 y.o. female who [facility] with a chief complaint of shortness of breath.   Assessment: Currently being treated for r/o meningitis with ceftriaxone/Vancomycin/ampicillin/acyclovir as well as anidulafungin. 1/4 BCx with GPR's and no identification on BCID. This is likely contaminant.   Name of physician (or Provider) Contacted: Dr. Darrick Pennaeterding ([email or web address][place]link)  Current antibiotics: ceftriaxone/Vancomycin/ampicillin/acyclovir / anidulafuntin  Changes to prescribed antibiotics recommended:  Patient is on recommended antibiotics - No changes needed  No results found for this or any previous visit.  Vinnie LevelBenjamin Girtie Wiersma, PharmD., BCPS Clinical Pharmacist Clinical phone for 03/14/18 until 8AM: [phone]

## 2018-03-14 NOTE — Progress Notes (Addendum)
Jenny [name]  UEA:[id number] DOB: June 04, 1934 DOA: 04-03-18 PCP: [place], Betty G, MD    Reason for Consult / Chief Complaint:  Shortness of breath Respiratory failure  Consulting MD:  Dr. Patria Mane  HPI/Brief Narrative   Patient known to Dr. Sherene Sires who had been scheduled for thoracentesis 8/23 Thoracentesis was not performed  Her breathing worsened significantly 8/24 Required high flow oxygen brought into the emergency room She decompensated in the emergency room and not to be intubated In the ICU, requiring pressors On the ventilator 8/25>> Transferred from [facility] to [facility] for worsening mental status suspected meningitis. For LP 8/25 after 8 pm ( based on last Lovenox dosing). Stable on vent  Assessment & Plan:  1.  Hypoxemic respiratory failure -Likely related to a large right pleural effusion-post thoracentesis on 8/23 for 1700 cc of clear fluid  Plan: - VAP precautions - No weaning for now - Titrate Fio2  and PEEP for sats 88-92% - CXR prn - SBT's when able  2. Cardiovascular Lactate 1.83 on 8/23 Last Echo 03/2017>> EF 55-60%, LA moderately dilated, RV Moderately dilated, RA moderately dilated, AV trivial regurg, TV severe regurg, PAP 31 mmHg. - Plan: - Echo - Trend BNP - Trend troponins - Tele moitoring - EKG prn - Titrate Levophed for MAP goal of 65 mm Hg,wean as able   3. Large right pleural effusion -Previous thoracentesis had cytology negative-performed 02/18/2018 for 2 L -Felt to be related to heart failure -Pleural fluid is transudative Plan: - Trend CXR - Follow up pleural fluid studies/ cytology - Hold lasix while hypotensive    4.Acute kidney injury Creatinine slowly down trending Hyperkalemia 8/23 - Continue to monitor electrolytes - BMET now - Trend lactate prn - Monitor BMET and UO daily - Maintain renal perfusion - Avoid nephrotoxic medications - Check Mag and Phos   5. Sepsis Leukocytosis T Max 99.6 - Present on admission - Suspected  meningitis 8/42 with new AMS Plan: -Continue antibiotics- broadened 8/24 for suspected meningitis - see above - Follow cultures - Trend fever curve/ WBC - Continue antifungal as she has a macerated lesion on  breast - Eraxis added 8/24 - Trend PCT  Hematology Leukocytosis Plan: Lovenox for prophylaxis Hold dose 8/25 for pending LP 8/15 after 8 pm PAS hose ordered for DVT prophylaxis while off lovenox  Trend CBC daily  Monitor for bleeding   6.  History of pulmonary nodule - to be followed as outpatient   7.  Morbid obesity Plan: Continue TF to meet requirements for Critical Illness Hold TF at 1600 8/25 for pending LP Nutrition per dietitian recommendations  8. Neuro AMS 8/24 prompted transfer to [facility] from [facility] for neurology consult For LP 8/25  PM by Floro Rads ( Last does Lovenox 8/24 at 19:50) CT Head 03/13/2018: Mild atrophy and white matter changes are likely within normal limits for age. No acute or focal lesion to explain mental status changes or unresponsiveness. Plan: Neuro checks per unit protocol Daily wake up assessment Rass Goal 0 to -1 Minimize sedation LP 8/25  Best practice/Goals of care/disposition.   DVT prophylaxis: Enoxaparin/ PAS hose GI prophylaxis: Protonix Diet: N.p.o. at present Mobility: Bedrest Code Status: Full code Family Communication: No family present  Disposition / Summary of Today's Plan 03/14/18   We will continue to stabilize She is on full vent support at the present time-FiO2 being weaned and tolerating it well Requiring pressors-still on Levophed-not to increase to 28 mcg   Procedures: Thoracentesis  8/24 Had 1700 cc drained from the right pleural space -Pleural fluid is transudate of, no organisms  Significant Diagnostic Tests: CT head 8/24 Mild atrophy and white matter changes are likely within normal limits for age. 2. No acute or focal lesion to explain mental status changes or Unresponsiveness. Echo 03/2017>> EF  55-60%, LA moderately dilated, RV Moderately dilated, RA moderately dilated, AV trivial regurg, TV severe regurg, PAP 31 mmHg.  Micro Data: Pleural Fluid 8/23>> Sputum Culture 8/23>> Blood 8/23>>  Antimicrobials:  Empirically started on vancomycin and Zosyn Vancomycin 8/24 >> Zosyn 8/23 >> Eraxis 8/24 >> Acyclovir 8/24>> Ampicillin 8/24>> Rocephin 8/24>>  Subjective  Transferred from [facility] for changes in mental status. Concern for meningitis. For LP 8/25 after 8 pm Remains stable on vent 40%, 5 Peep, Rate 18, TV 400 On Levo at 64 mcg Following some commands 8/25/am.  Objective    Examination: General: Elderly lady, sedated on vent  ENT: Moist oral mucosa Lungs: Decreased air entry bilaterally Cardiovascular: S1-S2 appreciated Abdomen: Bowel sounds appreciated, nontender Extremities: No edema Neuro: Sedate GU: Urine output is fair   Blood pressure (!) 95/53, pulse 65, temperature 98.7 F (37.1 C), temperature source Axillary, resp. rate (!) 21, height 5\' 2"  (1.575 m), weight 92 kg, SpO2 96 %.    Vent Mode: PRVC FiO2 (%):  [40 %-50 %] 40 % Set Rate:  [18 bmp] 18 bmp Vt Set:  [400 mL-460 mL] 400 mL PEEP:  [5 cmH20] 5 cmH20 Plateau Pressure:  [14 cmH20-16 cmH20] 14 cmH20   Intake/Output Summary (Last 24 hours) at 03/14/2018 0910 Last data filed at 03/14/2018 0800 Gross per 24 hour  Intake 3977.72 ml  Output 3305 ml  Net 672.72 ml   Filed Weights   2018/03/14 1806 03/13/18 0500 03/14/18 0213  Weight: 89.6 kg 92.1 kg 92 kg     Labs   CBC: Recent Labs  Lab 03/10/18 1545 2018/03/14 1447 March 14, 2018 1943  WBC 7.3 12.2* 16.9*  NEUTROABS 5.4 8.9*  --   HGB 12.5 12.8 13.3  HCT 38.9 42.2 43.9  MCV 90.4 97.2 96.1  PLT 316.0 384 453*   Basic Metabolic Panel: Recent Labs  Lab 03/10/18 1545 03-14-2018 1714 03/13/18 0329 03/14/18 0431  NA 140 145  --   --   K 4.7 5.7*  --   --   CL 97 101  --   --   CO2 35* 31  --   --   GLUCOSE 77 103*  --   --   BUN 48* 62*  --    --   CREATININE 1.31* 1.96* 1.83* 1.68*  CALCIUM 9.8 8.9  --   --    GFR: Estimated Creatinine Clearance: 27.3 mL/min (A) (by C-G formula based on SCr of 1.68 mg/dL (H)). Recent Labs  Lab 03/10/18 1545 March 14, 2018 1447 14-Mar-2018 1504 Mar 14, 2018 1722 03-14-2018 1943  WBC 7.3 12.2*  --   --  16.9*  LATICACIDVEN  --   --  2.16* 1.83  --    Liver Function Tests: Recent Labs  Lab 03/10/18 1545 2018/03/14 1714  AST 27 54*  ALT 17 27  ALKPHOS 60 60  BILITOT 1.2 2.1*  PROT 6.9 6.7  ALBUMIN 3.2* 3.0*   No results for input(s): LIPASE, AMYLASE in the last 168 hours. No results for input(s): AMMONIA in the last 168 hours. ABG    Component Value Date/Time   PHART 7.337 (L) 14-Mar-2018 1510   PCO2ART 55.8 (H) Mar 14, 2018 1510   PO2ART 89.1  Jul 17, 2018 1510   HCO3 29.1 (H) Jul 17, 2018 1510   O2SAT 95.7 Jul 17, 2018 1510    Coagulation Profile: No results for input(s): INR, PROTIME in the last 168 hours. Cardiac Enzymes: Recent Labs  Lab 12/01/2017 1447  TROPONINI <0.03   HbA1C: No results found for: HGBA1C CBG: No results for input(s): GLUCAP in the last 168 hours.   Review of Systems:    Unable as patient is intubated and sedated Past medical history   Past Medical History:  Diagnosis Date  . Anxiety   . Arthritis   . Hyperlipidemia   . Hypertension   . Osteoporosis    Social History   Social History   Socioeconomic History  . Marital status: Widowed    Spouse name: Not on file  . Number of children: Not on file  . Years of education: Not on file  . Highest education level: Not on file  Occupational History  . Not on file  Social Needs  . Financial resource strain: Not on file  . Food insecurity:    Worry: Not on file    Inability: Not on file  . Transportation needs:    Medical: Not on file    Non-medical: Not on file  Tobacco Use  . Smoking status: Never Smoker  . Smokeless tobacco: Never Used  Substance and Sexual Activity  . Alcohol use: No  . Drug use:  No  . Sexual activity: Not Currently  Lifestyle  . Physical activity:    Days per week: Not on file    Minutes per session: Not on file  . Stress: Not on file  Relationships  . Social connections:    Talks on phone: Not on file    Gets together: Not on file    Attends religious service: Not on file    Active member of club or organization: Not on file    Attends meetings of clubs or organizations: Not on file    Relationship status: Not on file  . Intimate partner violence:    Fear of current or ex partner: Not on file    Emotionally abused: Not on file    Physically abused: Not on file    Forced sexual activity: Not on file  Other Topics Concern  . Not on file  Social History Narrative   Widowed 2012. 2 children (lost 3rd son unknown cause at 1248). 9 grandchildren. 2 greatgrandchildren.    Lives alone. Does everything for herself except yardwork. 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 Worked at [employer] before that.       Hobbies: day [facility], time with family, enjoys meal gatherings    Family history    Family History  Problem Relation Age of Onset  . Prostate cancer Father   . Hypertension Father   . Parkinsonism Brother     Allergies No Known Allergies  Home meds  Prior to Admission medications   Medication Sig Start Date End Date Taking? Authorizing Provider  ALPRAZolam (XANAX) 0.25 MG tablet Take 1 tablet (0.25 mg total) by mouth daily as needed for anxiety. 09/22/17   [place], Betty G, MD  amLODipine-olmesartan (AZOR) 5-40 MG tablet TAKE 1 TABLET BY MOUTH EVERY DAY 03/01/18   [place], Betty G, MD  aspirin 81 MG tablet Take 81 mg by mouth daily.    [provider]  bisoprolol-hydrochlorothiazide (ZIAC) 5-6.25 MG tablet Take 1 tablet daily by  mouth. 05/26/17   [place], Betty G, MD  citalopram (CELEXA) 20 MG tablet Take 1 tablet (20 mg total) by mouth daily. 12/28/17   [place], Betty G, MD    fenoprofen (NALFON) 600 MG TABS tablet Take 600 mg by mouth daily.    [provider]  fish oil-omega-3 fatty acids 1000 MG capsule Take 2 g by mouth daily.    [provider]  fluticasone (FLONASE) 50 MCG/ACT nasal spray Place 2 sprays into both nostrils daily. 01/19/18   [place], Betty G, MD  furosemide (LASIX) 20 MG tablet Take 2 tablets (40 mg total) by mouth daily. 03/01/18   Nyoka Cowden, MD  H[place] 300 MG CAPS Take 300 mg by mouth 2 (two) times daily.    [provider]  Multiple Vitamin (MULTIVITAMIN) tablet Take 1 tablet by mouth daily.      [provider]  nystatin (MYCOSTATIN/NYSTOP) powder Apply topically 4 (four) times daily. 03/05/18   [place], Betty G, MD  spironolactone (ALDACTONE) 25 MG tablet Take 1 tablet (25 mg total) by mouth daily. 02/11/18   Glenford Bayley, NP  Vitamin D, Ergocalciferol, (DRISDOL) 50000 units CAPS capsule TAKE 1 CAPSULE EVERY OTHER WEEK 01/15/17   Shelva Majestic, MD     LOS: 2 days   Care time spent evaluating and reviewing records, from late and plan of care 60+ minutes  Bevelyn Ngo, AGACNP-BC [facility] Pulmonary/Critical Care Medicine Pager # [phone] 03/14/2018 10:07 AM  Attending Note:  84 year old female with extensive PMH who presents to [facility] from [facility] for neurology to evaluate.  Concern for meningitis.  On exam, she is unresponsive with a reasonably stiff neck.  I reviewed CXR myself, ETT is in a good position.  Discussed with PCCM-NP.  Will maintain on current sedation.  LP to be done tonight at 8PM by IR since patient received lovenox at 8 PM yesterday.  Will call ID to evaluate patient for meningitis.  Hold all diuretics given hypotension.  Start levophed via TLC for BP support.  IVF as ordered.  PCCM will continue to follow.  The patient is critically ill with multiple organ systems failure and requires high complexity decision making for assessment and support, frequent evaluation and  titration of therapies, application of advanced monitoring technologies and extensive interpretation of multiple databases.   Critical Care Time devoted to patient care services described in this note is  34  Minutes. This time reflects time of care of this signee Dr Koren Bound. This critical care time does not reflect procedure time, or teaching time or supervisory time of PA/NP/Med student/Med Resident etc but could involve care discussion time.  Alyson Reedy, M.D. [facility] Pulmonary/Critical Care Medicine. Pager: [phone]. After hours pager: [phone].

## 2018-03-14 NOTE — Progress Notes (Signed)
Subjective: Following some of RNs commands this AM.   Objective: Current vital signs: BP (!) 95/53 (BP Location: Right Arm)   Pulse 65   Temp 98.7 F (37.1 C) (Axillary)   Resp (!) 21   Ht '5\' 2"'$  (1.575 m)   Wt 92 kg   SpO2 96%   BMI 37.10 kg/m  Vital signs in last 24 hours: Temp:  [98.7 F (37.1 C)-99.6 F (37.6 C)] 98.7 F (37.1 C) (08/25 0800) Pulse Rate:  [54-79] 65 (08/25 0800) Resp:  [12-24] 21 (08/25 0800) BP: (92-115)/(46-81) 95/53 (08/25 0800) SpO2:  [92 %-100 %] 96 % (08/25 0800) Arterial Line BP: (70-114)/(35-61) 70/61 (08/25 0800) FiO2 (%):  [40 %-50 %] 40 % (08/25 0800) Weight:  [92 kg] 92 kg (08/25 0213)  Intake/Output from previous day: 08/24 0701 - 08/25 0700 In: 3562.4 [I.V.:2168.8; NG/GT:750; IV Piggyback:643.7] Out: 1610 [Urine:3130] Intake/Output this shift: Total I/O In: 676.3 [I.V.:33.9; IV Piggyback:642.4] Out: 175 [Urine:175] Nutritional status:  Diet Order            Diet NPO time specified  Diet effective now             HEENT: White/AT. Nuchal rigidity continues to be present.  Lungs: Intubated.   Neurologic Exam: Ment: Will follow simple commands. Keeps eyes tightly closed and does not attempt to gaze at visual stimuli with eyes held open. Does not attempt to communicate. Will thrash all 4 limbs in response to noxious stimuli.  CN: PERRL. Closes eyes tightly to bright light. Eyes midline. No nystagmus. Face symmetric grimace to noxious.  Motor/Sensory: Will thrash all 4 limbs in response to noxious stimuli. No asymmetry noted.  Reflexes: 4+ patellar reflexes. Toes upgoing.  Other: No jerking, twitching or other seizure-like activity is seen.    Lab Results: Results for orders placed or performed during the hospital encounter of 02/20/2018 (from the past 48 hour(s))  Blood culture (routine x 2)     Status: None (Preliminary result)   Collection Time: 03/13/2018  2:39 PM  Result Value Ref Range   Specimen Description      BLOOD LEFT  ANTECUBITAL Performed at [facility], [place] [address] [place], [place] [address]    Special Requests      BOTTLES DRAWN AEROBIC AND ANAEROBIC Blood Culture results may not be optimal due to an excessive volume of blood received in culture bottles Performed at [place] [address], [place], [place] [address]    Culture  Setup Time      GRAM POSITIVE RODS AEROBIC BOTTLE ONLY Organism ID to follow CRITICAL RESULT CALLED TO, READ BACK BY AND VERIFIED WITH: B.MANCHERIL,PHARMD AT 1914 ON 03/14/18 BY [email or web address]    Culture      NO GROWTH < 24 HOURS Performed at [facility], [place] [address], [place], [place] [address]    Report Status PENDING   Blood Culture ID Panel (Reflexed)     Status: None   Collection Time: 03/10/2018  2:39 PM  Result Value Ref Range   Enterococcus species NOT DETECTED NOT DETECTED   Listeria monocytogenes NOT DETECTED NOT DETECTED   Staphylococcus species NOT DETECTED NOT DETECTED   Staphylococcus aureus NOT DETECTED NOT DETECTED   Streptococcus species NOT DETECTED NOT DETECTED   Streptococcus agalactiae NOT DETECTED NOT DETECTED   Streptococcus pneumoniae NOT DETECTED NOT DETECTED   Streptococcus pyogenes NOT DETECTED NOT DETECTED   Acinetobacter baumannii NOT DETECTED NOT DETECTED   Enterobacteriaceae species NOT  DETECTED NOT DETECTED   Enterobacter cloacae complex NOT DETECTED NOT DETECTED   Escherichia coli NOT DETECTED NOT DETECTED   Klebsiella oxytoca NOT DETECTED NOT DETECTED   Klebsiella pneumoniae NOT DETECTED NOT DETECTED   Proteus species NOT DETECTED NOT DETECTED   Serratia marcescens NOT DETECTED NOT DETECTED   Haemophilus influenzae NOT DETECTED NOT DETECTED   Neisseria meningitidis NOT DETECTED NOT DETECTED   Pseudomonas aeruginosa NOT DETECTED NOT DETECTED   Candida albicans NOT DETECTED NOT DETECTED   Candida glabrata NOT DETECTED NOT DETECTED   Candida krusei NOT DETECTED NOT DETECTED    Candida parapsilosis NOT DETECTED NOT DETECTED   Candida tropicalis NOT DETECTED NOT DETECTED    Comment: Performed at [facility], [place] [address] [place], [place] [address]  CBC with Differential/Platelet     Status: Abnormal   Collection Time: 02/22/2018  2:47 PM  Result Value Ref Range   WBC 12.2 (H) 4.0 - 10.5 K/uL   RBC 4.34 3.87 - 5.11 MIL/uL   Hemoglobin 12.8 12.0 - 15.0 g/dL   HCT 42.2 36.0 - 46.0 %   MCV 97.2 78.0 - 100.0 fL   MCH 29.5 26.0 - 34.0 pg   MCHC 30.3 30.0 - 36.0 g/dL   RDW 17.3 (H) 11.5 - 15.5 %   Platelets 384 150 - 400 K/uL   Neutrophils Relative % 73 %   Neutro Abs 8.9 (H) 1.7 - 7.7 K/uL   Lymphocytes Relative 8 %   Lymphs Abs 0.9 0.7 - 4.0 K/uL   Monocytes Relative 18 %   Monocytes Absolute 2.2 (H) 0.1 - 1.0 K/uL   Eosinophils Relative 1 %   Eosinophils Absolute 0.1 0.0 - 0.7 K/uL   Basophils Relative 0 %   Basophils Absolute 0.0 0.0 - 0.1 K/uL    Comment: Performed at [facility], [place] [address] [place], [place] [address]  Troponin I     Status: None   Collection Time: 03/06/2018  2:47 PM  Result Value Ref Range   Troponin I <0.03 <0.03 ng/mL    Comment: Performed at [facility], [place] [address], [place], [place] [address]  Brain natriuretic peptide     Status: Abnormal   Collection Time: 03/06/2018  2:47 PM  Result Value Ref Range   B Natriuretic Peptide 854.9 (H) 0.0 - 100.0 pg/mL    Comment: Performed at [facility], [place] [address], [place], [place] [address]  Blood culture (routine x 2)     Status: None (Preliminary result)   Collection Time: 03/17/2018  2:53 PM  Result Value Ref Range   Specimen Description      BLOOD RIGHT ANTECUBITAL Performed at [facility], [place] [address] [place], [place] [address]    Special Requests      BOTTLES DRAWN AEROBIC AND ANAEROBIC Blood Culture adequate volume Performed at [place] [address]  [place], [place] [address]    Culture      NO GROWTH < 24 HOURS Performed at [place] [address], [place], [place] [address]    Report Status PENDING   I-Stat CG4 Lactic Acid, ED     Status: Abnormal   Collection Time: 02/24/2018  3:04 PM  Result Value Ref Range   Lactic Acid, Venous 2.16 (HH) 0.5 - 1.9 mmol/L   Comment NOTIFIED PHYSICIAN   Blood gas, arterial ([facility] & [facility] ONLY)     Status: Abnormal  Collection Time: 02/21/2018  3:10 PM  Result Value Ref Range   FIO2 100.00    Delivery systems VENTILATOR    Mode PRESSURE REGULATED VOLUME CONTROL    VT 400 mL   LHR 18 resp/min   Peep/cpap 5.0 cm H20   pH, Arterial 7.337 (L) 7.350 - 7.450   pCO2 arterial 55.8 (H) 32.0 - 48.0 mmHg   pO2, Arterial 89.1 83.0 - 108.0 mmHg   Bicarbonate 29.1 (H) 20.0 - 28.0 mmol/L   Acid-Base Excess 2.8 (H) 0.0 - 2.0 mmol/L   O2 Saturation 95.7 %   Patient temperature 98.6    Collection site RIGHT RADIAL    Drawn by [id number]    Sample type ARTERIAL DRAW    Allens test (pass/fail) PASS PASS    Comment: Performed at [facility], [place] [address], [place], [place] [address]  Comprehensive metabolic panel     Status: Abnormal   Collection Time: 03/09/2018  5:14 PM  Result Value Ref Range   Sodium 145 135 - 145 mmol/L   Potassium 5.7 (H) 3.5 - 5.1 mmol/L   Chloride 101 98 - 111 mmol/L   CO2 31 22 - 32 mmol/L   Glucose, Bld 103 (H) 70 - 99 mg/dL   BUN 62 (H) 8 - 23 mg/dL   Creatinine, Ser 1.96 (H) 0.44 - 1.00 mg/dL   Calcium 8.9 8.9 - 10.3 mg/dL   Total Protein 6.7 6.5 - 8.1 g/dL   Albumin 3.0 (L) 3.5 - 5.0 g/dL   AST 54 (H) 15 - 41 U/L   ALT 27 0 - 44 U/L   Alkaline Phosphatase 60 38 - 126 U/L   Total Bilirubin 2.1 (H) 0.3 - 1.2 mg/dL   GFR calc non Af Amer 23 (L) >60 mL/min   GFR calc Af Amer 26 (L) >60 mL/min    Comment: (NOTE) The eGFR has been calculated using the CKD EPI equation. This calculation has not been validated in all clinical situations. eGFR's persistently  <60 mL/min signify possible Chronic Kidney Disease.    Anion gap 13 5 - 15    Comment: Performed at [facility], [place] [address], [place], [place] [address]  Lactate dehydrogenase (pleural or peritoneal fluid)     Status: Abnormal   Collection Time: 02/23/2018  5:15 PM  Result Value Ref Range   LD, Fluid 98 (H) 3 - 23 U/L    Comment: (NOTE) Results should be evaluated in conjunction with serum values    Fluid Type-FLDH Pleural R     Comment: Performed at [facility], [place] [address], [place], [place] [address]  Body fluid culture     Status: None (Preliminary result)   Collection Time: 03/01/2018  5:15 PM  Result Value Ref Range   Specimen Description      PLEURAL RIGHT Performed at [place] [address] [place], [place] [address]    Special Requests      NONE Performed at [facility], [place] [address] [place], [place] [address]    Gram Stain      WBC PRESENT, PREDOMINANTLY MONONUCLEAR NO ORGANISMS SEEN Gram Stain Report Called to,Read Back By and Verified With: M.CATALAN AT 1913 ON 03/19/2018 BY N.THOMPSON CYTOSPIN SMEAR Performed at [place] [address], [place], [place] [address]    Culture      NO GROWTH < 24 HOURS Performed at [place],  [place] [address]    Report Status PENDING   Protein, pleural or peritoneal fluid     Status: None   Collection Time: 03/10/2018  5:15 PM  Result Value Ref Range   Total protein, fluid <3.0 g/dL   Fluid Type-FTP Pleural R     Comment: Performed at [facility], [place] [address], [place], [place] [address]  Glucose, pleural or peritoneal fluid     Status: None   Collection Time: 03/20/2018  5:15 PM  Result Value Ref Range   Glucose, Fluid 94 mg/dL    Comment: (NOTE) No normal range established for this test Results should be evaluated in conjunction with serum values    Fluid  Type-FGLU Pleural R     Comment: Performed at [facility], [place] [address], [place], [place] [address]  I-Stat CG4 Lactic Acid, ED     Status: None   Collection Time: 03/18/2018  5:22 PM  Result Value Ref Range   Lactic Acid, Venous 1.83 0.5 - 1.9 mmol/L  MRSA PCR Screening     Status: None   Collection Time: 02/20/2018  6:16 PM  Result Value Ref Range   MRSA by PCR NEGATIVE NEGATIVE    Comment:        The GeneXpert MRSA Assay (FDA approved for NASAL specimens only), is one component of a comprehensive MRSA colonization surveillance program. It is not intended to diagnose MRSA infection nor to guide or monitor treatment for MRSA infections. Performed at [facility], [place] [address] [place], [place] [address]   CBC     Status: Abnormal   Collection Time: 03/05/2018  7:43 PM  Result Value Ref Range   WBC 16.9 (H) 4.0 - 10.5 K/uL   RBC 4.57 3.87 - 5.11 MIL/uL   Hemoglobin 13.3 12.0 - 15.0 g/dL   HCT 43.9 36.0 - 46.0 %   MCV 96.1 78.0 - 100.0 fL   MCH 29.1 26.0 - 34.0 pg   MCHC 30.3 30.0 - 36.0 g/dL   RDW 16.9 (H) 11.5 - 15.5 %   Platelets 453 (H) 150 - 400 K/uL    Comment: Performed at [facility], [place] [address] [place], [place] [address]  Creatinine, serum     Status: Abnormal   Collection Time: 03/13/18  3:29 AM  Result Value Ref Range   Creatinine, Ser 1.83 (H) 0.44 - 1.00 mg/dL   GFR calc non Af Amer 25 (L) >60 mL/min   GFR calc Af Amer 28 (L) >60 mL/min    Comment: (NOTE) The eGFR has been calculated using the CKD EPI equation. This calculation has not been validated in all clinical situations. eGFR's persistently <60 mL/min signify possible Chronic Kidney Disease. Performed at [facility], [place] [address], [place], [place] [address]   Creatinine, serum     Status: Abnormal   Collection Time: 03/14/18  4:31 AM  Result Value Ref Range   Creatinine, Ser 1.68 (H) 0.44 - 1.00 mg/dL   GFR calc  non Af Amer 27 (L) >60 mL/min   GFR calc Af Amer 32 (L) >60 mL/min    Comment: (NOTE) The eGFR has been calculated using the CKD EPI equation. This calculation has not been validated in all clinical situations. eGFR's persistently <60 mL/min signify possible Chronic Kidney Disease. Performed at [facility], [place] [address], [place], [place] [address]     Recent Results (from the past 240 hour(s))  Blood culture (routine  x 2)     Status: None (Preliminary result)   Collection Time: 03/01/2018  2:39 PM  Result Value Ref Range Status   Specimen Description   Final    BLOOD LEFT ANTECUBITAL Performed at [place] [address], [place], [place] [address]    Special Requests   Final    BOTTLES DRAWN AEROBIC AND ANAEROBIC Blood Culture results may not be optimal due to an excessive volume of blood received in culture bottles Performed at [place] [address] [place], [place] [address]    Culture  Setup Time   Final    GRAM POSITIVE RODS AEROBIC BOTTLE ONLY Organism ID to follow CRITICAL RESULT CALLED TO, READ BACK BY AND VERIFIED WITH: B.MANCHERIL,PHARMD AT 6761 ON 03/14/18 BY G.[email or web address]    Culture   Final    NO GROWTH < 24 HOURS Performed at [facility], [place] [address] [place], [place] [address]    Report Status PENDING  Incomplete  Blood Culture ID Panel (Reflexed)     Status: None   Collection Time: 03/05/2018  2:39 PM  Result Value Ref Range Status   Enterococcus species NOT DETECTED NOT DETECTED Final   Listeria monocytogenes NOT DETECTED NOT DETECTED Final   Staphylococcus species NOT DETECTED NOT DETECTED Final   Staphylococcus aureus NOT DETECTED NOT DETECTED Final   Streptococcus species NOT DETECTED NOT DETECTED Final   Streptococcus agalactiae NOT DETECTED NOT DETECTED Final   Streptococcus pneumoniae NOT DETECTED NOT DETECTED Final   Streptococcus pyogenes NOT DETECTED NOT DETECTED Final   Acinetobacter  baumannii NOT DETECTED NOT DETECTED Final   Enterobacteriaceae species NOT DETECTED NOT DETECTED Final   Enterobacter cloacae complex NOT DETECTED NOT DETECTED Final   Escherichia coli NOT DETECTED NOT DETECTED Final   Klebsiella oxytoca NOT DETECTED NOT DETECTED Final   Klebsiella pneumoniae NOT DETECTED NOT DETECTED Final   Proteus species NOT DETECTED NOT DETECTED Final   Serratia marcescens NOT DETECTED NOT DETECTED Final   Haemophilus influenzae NOT DETECTED NOT DETECTED Final   Neisseria meningitidis NOT DETECTED NOT DETECTED Final   Pseudomonas aeruginosa NOT DETECTED NOT DETECTED Final   Candida albicans NOT DETECTED NOT DETECTED Final   Candida glabrata NOT DETECTED NOT DETECTED Final   Candida krusei NOT DETECTED NOT DETECTED Final   Candida parapsilosis NOT DETECTED NOT DETECTED Final   Candida tropicalis NOT DETECTED NOT DETECTED Final    Comment: Performed at [facility], [place] [address], [place], [place] [address]  Blood culture (routine x 2)     Status: None (Preliminary result)   Collection Time: 03/11/2018  2:53 PM  Result Value Ref Range Status   Specimen Description   Final    BLOOD RIGHT ANTECUBITAL Performed at [place] [address], [place], [place] [address]    Special Requests   Final    BOTTLES DRAWN AEROBIC AND ANAEROBIC Blood Culture adequate volume Performed at [place] [address] [place], [place] [address]    Culture   Final    NO GROWTH < 24 HOURS Performed at [place] [address], [place], [place] [address]    Report Status PENDING  Incomplete  Body fluid culture     Status: None (Preliminary result)   Collection Time: 03/18/2018  5:15 PM  Result Value Ref Range Status   Specimen Description   Final    PLEURAL RIGHT Performed at [facility], [place] [address] [place], [place] [address]    Special Requests   Final    NONE Performed at [facility], [place] [address] [place], [place] [address]    Gram Stain   Final    WBC PRESENT, PREDOMINANTLY MONONUCLEAR NO ORGANISMS SEEN Gram Stain Report Called to,Read Back By and Verified With: M.CATALAN AT 1913 ON 03/11/2018 BY N.THOMPSON CYTOSPIN SMEAR Performed at [place] [address], [place], [place] [address]    Culture   Final    NO GROWTH < 24 HOURS Performed at [place] [address], [place], [place] [address]    Report Status PENDING  Incomplete  MRSA PCR Screening     Status: None   Collection Time: 03/19/2018  6:16 PM  Result Value Ref Range Status   MRSA by PCR NEGATIVE NEGATIVE Final    Comment:        The GeneXpert MRSA Assay (FDA approved for NASAL specimens only), is one component of a comprehensive MRSA colonization surveillance program. It is not intended to diagnose MRSA infection nor to guide or monitor treatment for MRSA infections. Performed at [facility], [place] [address], [place], [place] [address]     Lipid Panel No results for input(s): CHOL, TRIG, HDL, CHOLHDL, VLDL, LDLCALC in the last 72 hours.  Studies/Results: Ct Head Wo Contrast  Result Date: 03/13/2018 CLINICAL DATA:  Altered level of consciousness, unexplained. EXAM: CT HEAD WITHOUT CONTRAST TECHNIQUE: Contiguous axial images were obtained from the base of the skull through the vertex without intravenous contrast. COMPARISON:  None. FINDINGS: Brain: Mild atrophy and white matter changes are present bilaterally. This study is mildly degraded by patient motion. No acute infarct, hemorrhage, or mass lesion is present. Basal ganglia are intact. Acute or focal cortical lesions are present. The brainstem and cerebellum are normal. No significant extra-axial fluid collection is present. Vascular: No hyperdense vessel or unexpected calcification. Skull: Calvarium is intact. No focal lytic or blastic lesions are present. No significant extracranial  soft tissue lesions present. Sinuses/Orbits: The paranasal sinuses and mastoid air cells are clear. Globes and orbits are within normal limits. IMPRESSION: 1. Mild atrophy and white matter changes are likely within normal limits for age. 2. No acute or focal lesion to explain mental status changes or unresponsiveness. Electronically Signed   By: San Morelle M.D.   On: 03/13/2018 17:15   Dg Chest Port 1 View  Result Date: 03/13/2018 CLINICAL DATA:  Central line placement EXAM: PORTABLE CHEST 1 VIEW COMPARISON:  03/02/2018 FINDINGS: Moderate layering right pleural effusion. Small left pleural effusion. No pneumothorax. Endotracheal tube terminates 1.3 cm above the carina. Enteric tube is looped in the stomach. Left IJ venous catheter terminates in the right atrium, 5 cm below the cavoatrial junction. Cardiomegaly. IMPRESSION: Left IJ venous catheter terminates in the right atrium, 5 cm below the cavoatrial junction. Additional support apparatus as above. Moderate right pleural effusion, increased. Electronically Signed   By: Julian Hy M.D.   On: 03/13/2018 12:02   Dg Chest Port 1 View  Result Date: 03/10/2018 CLINICAL DATA:  Dyspnea and shortness of breath. EXAM: PORTABLE CHEST 1 VIEW COMPARISON:  02/22/2018 FINDINGS: Endotracheal tube tip 2.9 cm above the carina. Nasogastric tube enters the stomach. Markedly reduced size of the right pleural effusion, with only minimal residual blunting of the right lateral costophrenic angle and trace fluid in the minor fissure. There is some fine interstitial accentuation in the right  lung possibly from mild re-expansion edema. Low lung volumes are present, causing crowding of the pulmonary vasculature. The left lung appears grossly clear. Atherosclerotic calcification of the aortic arch. Thoracic scoliosis. No appreciable pneumothorax. IMPRESSION: 1. No appreciable pneumothorax status post right thoracentesis. Minimal residual right pleural effusion. Faint  interstitial accentuation in the right lung, possibly from some degree of re-expansion edema. 2.  Atherosclerotic calcification of the aortic arch. Electronically Signed   By: Van Clines M.D.   On: 02/20/2018 17:56   Dg Chest Portable 1 View  Result Date: 03/20/2018 CLINICAL DATA:  Respiratory failure status post intubation. EXAM: PORTABLE CHEST 1 VIEW COMPARISON:  Chest x-ray dated March 10, 2018. FINDINGS: Interval placement of an endotracheal tube with the tip approximately 3.2 cm above the carina. Enteric tube seen entering the stomach with the tip below the field of view. Stable cardiomegaly. Large loculated right pleural effusion with consolidation/atelectasis in the right lower lobe. The left lung is clear. No pneumothorax. No acute osseous abnormality. IMPRESSION: 1. Appropriately positioned endotracheal tube. 2. Large loculated right pleural effusion with right lower lobe collapse/consolidation. Electronically Signed   By: Titus Dubin M.D.   On: 03/17/2018 15:19   Dg Abd Portable 1 View  Result Date: 02/25/2018 CLINICAL DATA:  OG tube placement. EXAM: PORTABLE ABDOMEN - 1 VIEW COMPARISON:  None. FINDINGS: OG tube tip is in the distal stomach. Visualized bowel gas pattern is normal. Thoracolumbar scoliosis. Large right pleural effusion. Slight atelectasis at the left lung base medially. IMPRESSION: 1. OG tube tip in the distal stomach in good position. 2. Large right pleural effusion. Electronically Signed   By: Lorriane Shire M.D.   On: 03/11/2018 15:18    Medications:  Scheduled: . aspirin  81 mg Per Tube Daily  . chlorhexidine gluconate (MEDLINE KIT)  15 mL Mouth Rinse BID  . citalopram  20 mg Oral Daily  . enoxaparin (LOVENOX) injection  30 mg Subcutaneous Q24H  . feeding supplement (VITAL AF 1.2 CAL)  1,000 mL Per Tube Q24H  . mouth rinse  15 mL Mouth Rinse 10 times per day  . miconazole nitrate   Topical QID  . pantoprazole sodium  40 mg Per Tube Daily    Continuous: . sodium chloride 10 mL/hr at 03/14/18 0800  . sodium chloride Stopped (03/13/18 1727)  . acyclovir Stopped (03/14/18 0728)  . ampicillin (OMNIPEN) IV Stopped (03/14/18 8144)  . anidulafungin    . cefTRIAXone (ROCEPHIN)  IV Stopped (03/14/18 8185)  . midazolam (VERSED) infusion Stopped (03/13/18 0834)  . norepinephrine (LEVOPHED) Adult infusion 26 mcg/min (03/14/18 0800)  . vancomycin Stopped (03/14/18 0124)    EEG: Generalized irregular slow activity  Assessment:  84 y.o. female with a PMH of HTN, HLD, anxiety, allergic rhinitis, chronic leg swelling, pleural effusion (followed by outpatient pulmonary Dr. Volanda [place]) presented on 03/07/2018 with chief complaint SOB, DOE. She decompensated requiring intubation and ventilation. Patient was treated with Pressors and Abx for sepsis, underwent inpatient thoracentesis. Sedation was removed earlier in am, now patient without interaction on exam, and is with nuchal rigidity.   1. CT head without acute abnormality. Mild atrophy is noted.  2. Exam best localizes as bihemispheric dysfunction with some awareness to external stimuli, but obtunded. Exam sllightly improved since yesterday; now following some commands.  3. EEG was negative for seizure. Generalized irregular slow activityl was noted.  4. Suspicion for meningitis. Nuchal rigidity, AMS, fever and leukocytosis increase the clinical suspicion for meningitis. However, septic encephalopathy with/without a metabolic component  is also possible. Currently on meningitis-dose antibiotics. LP scheduled for tonight under fluoro.     Recommendations: - LP scheduled for tonight under fluoro. Off Lovenox, with last dose at 7:50 PM Saturday night. CT without findings to contraindicate LP.  Her health care power of attorney gave witnessed informed consent over the telephone last night. - DVT prophylaxis with SCDs   - Continue ampicillin, rocephin, acyclovir and vancomycin for possible meningitis  -  Avoid sedating medications - Discussed with CCM    35 minutes spent in the neurological evaluation and management of this critically ill patient. Time spent included coordination of care.    LOS: 2 days   '@Electronically'$  signed: Dr. Kerney Elbe 03/14/2018  9:45 AM

## 2018-03-14 NOTE — Progress Notes (Signed)
Microbiology lab called this RN to verify order for anaerobic culture for patient's CSF. ID MD paged about patient's CSF labs. MD said to not do an anaerobic culture. E-Link MD said he would defer to ID in that matter. NP who put in order has been paged twice in order to verify lab order. Awaiting call back. Will continue to monitor.

## 2018-03-14 NOTE — Progress Notes (Signed)
Down getting lumbar puncture; VS not crossing over into epic

## 2018-03-14 NOTE — Progress Notes (Signed)
Spoke with the son over the phone, after discussion, the son does not want his mother to suffer and if prognosis is poor he would like to transition to comfort care.  After discussion, decision was made to continue current plan but change to be DNR.  Alyson ReedyWesam G. Yacoub, M.D. [facility]. Pager: [phone]. After hours pager: [phone].

## 2018-03-14 NOTE — Procedures (Signed)
[place] A. Merlene Laughter, MD     [email or web address]           HISTORY: This is an 84 year old female who presents with altered mental status.  The study is being done to evaluate for seizures as an underlying etiology.  MEDICATIONS: Scheduled Meds: . aspirin  81 mg Per Tube Daily  . chlorhexidine gluconate (MEDLINE KIT)  15 mL Mouth Rinse BID  . citalopram  20 mg Oral Daily  . enoxaparin (LOVENOX) injection  30 mg Subcutaneous Q24H  . feeding supplement (VITAL AF 1.2 CAL)  1,000 mL Per Tube Q24H  . mouth rinse  15 mL Mouth Rinse 10 times per day  . miconazole nitrate   Topical QID  . pantoprazole sodium  40 mg Per Tube Daily   Continuous Infusions: . sodium chloride 10 mL/hr at 03/14/18 1200  . sodium chloride 50 mL/hr at 03/14/18 1200  . acyclovir Stopped (03/14/18 0728)  . ampicillin (OMNIPEN) IV 2 g (03/14/18 1219)  . anidulafungin 100 mg (03/14/18 1258)  . cefTRIAXone (ROCEPHIN)  IV Stopped (03/14/18 9675)  . midazolam (VERSED) infusion Stopped (03/13/18 0834)  . norepinephrine (LEVOPHED) Adult infusion 25 mcg/min (03/14/18 1200)  . vancomycin Stopped (03/14/18 0124)   PRN Meds:.sodium chloride, fentaNYL (SUBLIMAZE) injection, fentaNYL (SUBLIMAZE) injection, midazolam, midazolam  Prior to Admission medications   Medication Sig Start Date End Date Taking? Authorizing Provider  ALPRAZolam (XANAX) 0.25 MG tablet Take 1 tablet (0.25 mg total) by mouth daily as needed for anxiety. 09/22/17  Yes Martinique, Betty G, MD  amLODipine-olmesartan (AZOR) 5-40 MG tablet TAKE 1 TABLET BY MOUTH EVERY DAY 03/01/18  Yes [place], Betty G, MD  aspirin 81 MG tablet Take 81 mg by mouth daily.   Yes [provider]  bisoprolol-hydrochlorothiazide (ZIAC) 5-6.25 MG tablet Take 1 tablet daily by mouth. 05/26/17  Yes [place], Betty G, MD  citalopram (CELEXA) 20 MG tablet Take 1 tablet (20 mg total) by mouth daily. 12/28/17  Yes Martinique, Betty G, MD  fenoprofen (NALFON) 600 MG TABS  tablet Take 600 mg by mouth daily.   Yes [provider]  fish oil-omega-3 fatty acids 1000 MG capsule Take 2 g by mouth daily.   Yes [provider]  fluticasone (FLONASE) 50 MCG/ACT nasal spray Place 2 sprays into both nostrils daily. 01/19/18  Yes Martinique, Betty G, MD  furosemide (LASIX) 20 MG tablet Take 2 tablets (40 mg total) by mouth daily. 03/01/18  Yes Tanda Rockers, MD  [place] 300 MG CAPS Take 300 mg by mouth 2 (two) times daily.   Yes [provider]  Multiple Vitamin (MULTIVITAMIN) tablet Take 1 tablet by mouth daily.     Yes [provider]  nystatin (MYCOSTATIN/NYSTOP) powder Apply topically 4 (four) times daily. 03/05/18  Yes [place], Betty G, MD  spironolactone (ALDACTONE) 25 MG tablet Take 1 tablet (25 mg total) by mouth daily. 02/11/18  Yes Martyn Ehrich, NP  Vitamin D, Ergocalciferol, (DRISDOL) 50000 units CAPS capsule TAKE 1 CAPSULE EVERY OTHER WEEK 01/15/17  Yes Marin Olp, MD      ANALYSIS: A 16 channel recording using standard 10 20 measurements is conducted for 21 minutes.   The background activity gets as high as 6 hertz but is also interrupted with generalize delta the slowing activity.  Beta activity is observed in the frontal areas.  Awake and drowsy architecture are documented.  Photic stimulation and hyperventilation are not conducted.  There is no focal or lateralized slowing.  There is no epileptiform activity is observed.   IMPRESSION: 1.   This recording of the awake and drowsy states shows moderate global slowing indicating a moderate global encephalopathy.  However, no epileptiform activities are observed.      Kiegan Macaraeg A. Merlene Laughter, M.D.  Diplomate, Tax adviser of Psychiatry and Neurology ( Neurology).

## 2018-03-14 NOTE — Procedures (Signed)
History: 84 yo F with AMS, concern for meningitis  Sedation: None  Technique: This is a 21 channel routine scalp EEG performed at the bedside with bipolar and monopolar montages arranged in accordance to the international 10/20 system of electrode placement. One channel was dedicated to EKG recording.    Background: There is a posterior dominant rhythm of 7 -8 Hz which is poorly sustained. In addition, there is generalized irregular delta and theta activity throughout the study. Sleep structures are seen and are symmetric.   Photic stimulation: Physiologic driving is not performed  EEG Abnormalities: 1) Generalized irregular slow activity.   Clinical Interpretation: This EEG is consistent with a generalized non-specific cerebral dysfunction(encephalopathy). There was no seizure or seizure predisposition recorded on this study. Please note that a normal EEG does not preclude the possibility of epilepsy.   Ritta SlotMcNeill Kirkpatrick, MD [facility] [phone]  If 7pm- 7am, please page neurology on call as listed in AMION.

## 2018-03-14 NOTE — Consult Note (Signed)
[facility] for Infectious Disease       Reason for Consult: possible meningitis    Referring Physician: Dr. Nelda Marseille  Active Problems:   Hypoxemic respiratory failure, chronic (HCC)   Pressure injury of skin   . aspirin  81 mg Per Tube Daily  . chlorhexidine gluconate (MEDLINE KIT)  15 mL Mouth Rinse BID  . citalopram  20 mg Oral Daily  . [START ON 12-Apr-2018] enoxaparin (LOVENOX) injection  30 mg Subcutaneous Q24H  . feeding supplement (VITAL AF 1.2 CAL)  1,000 mL Per Tube Q24H  . mouth rinse  15 mL Mouth Rinse 10 times per day  . miconazole nitrate   Topical QID  . pantoprazole sodium  40 mg Per Tube Daily    Recommendations: CSF studies  Stop acyclovir Continue other antibiotics for now Narrow antibiotics to ceftriaxone if CSF studies reassuring  Assessment: She has leukocytosis, respiratory failure most c/w pneumonia.  I do not appreciate nuchal rigidity other than purposeful resistance but was on exam by neurology.  I agree she should continue antibiotics for now but stop if no significant findings on CSF this evening. Additionally nuchal rigidity and meningitis concern would not be c/w encephalitis so I will stop acyclovir.   Macerated lesions on breasts, on anidulafungin  Dr. Megan Salon to follow up tomorrow  Antibiotics: Vancomycin, ceftriaxone, ampicillin, acyclovir  HPI: Jenny [name] is a 84 y.o. female with a history of HTN, anxiety, chronic pleural effusions followed by Dr. Melvyn Novas who came in to the ED with SOB and DOE.  She had increasing pleural effusion associated with malaise and poor po and came to the Ed with worsening symptoms with respiratory failure and requried intubation.  + leukocytosis, afebrile.  Some nuchal rigidity noted on exam by neurology.  Report of some AMS. Currently intubated and son at bedside.  History from son and chart.  The son would like to make the patient DNR.     Review of Systems: Unobtainable due to patient factors   Past  Medical History:  Diagnosis Date  . Anxiety   . Arthritis   . Hyperlipidemia   . Hypertension   . Osteoporosis    From chart review, not obtainable from the patient  Social History   Tobacco Use  . Smoking status: Never Smoker  . Smokeless tobacco: Never Used  Substance Use Topics  . Alcohol use: No  . Drug use: No   From chart review, not obtainable from the patient  Family History  Problem Relation Age of Onset  . Prostate cancer Father   . Hypertension Father   . Parkinsonism Brother    From chart review, not obtainable from the patient  No Known Allergies From chart review, not obtainable from the patient  Physical Exam: Constitutional: NAD   Vitals:   03/14/18 1300 03/14/18 1400  BP: 99/62 (!) 93/57  Pulse: 63 61  Resp: 18 (!) 21  Temp:    SpO2: 97% 98%   EYES: anicteric ENMT: +ET Cardiovascular: Cor RRR Respiratory: breathing assisted on vent; coarse breath sounds anteriorly GI: Bowel sounds are normal, liver is not enlarged, spleen is not enlarged Musculoskeletal: no pedal edema noted Skin: negatives: no rash Neuro: sedated, some response when opening eyes  Lab Results  Component Value Date   WBC 16.8 (H) 03/14/2018   HGB 12.4 03/14/2018   HCT 39.9 03/14/2018   MCV 91.3 03/14/2018   PLT 355 03/14/2018    Lab Results  Component Value Date  CREATININE 1.52 (H) 03/14/2018   BUN 41 (H) 03/14/2018   NA 136 03/14/2018   K 4.1 03/14/2018   CL 100 03/14/2018   CO2 29 03/14/2018    Lab Results  Component Value Date   ALT 17 03/14/2018   AST 30 03/14/2018   ALKPHOS 51 03/14/2018     Microbiology: Recent Results (from the past 240 hour(s))  Blood culture (routine x 2)     Status: None (Preliminary result)   Collection Time: 03/08/2018  2:39 PM  Result Value Ref Range Status   Specimen Description   Final    BLOOD LEFT ANTECUBITAL Performed at [facility], [place] [address] [place], [place] [address]    Special Requests    Final    BOTTLES DRAWN AEROBIC AND ANAEROBIC Blood Culture results may not be optimal due to an excessive volume of blood received in culture bottles Performed at [facility] [address] [place], [place] [address]    Culture  Setup Time   Final    GRAM POSITIVE RODS AEROBIC BOTTLE ONLY Organism ID to follow CRITICAL RESULT CALLED TO, READ BACK BY AND VERIFIED WITH: B.MANCHERIL,PHARMD AT 1735 ON 03/14/18 BY G.MCADOO    Culture   Final    TOO YOUNG TO READ Performed at [facility], [place] [address] [place] [address] [place], [place] [address]    Report Status PENDING  Incomplete  Blood Culture ID Panel (Reflexed)     Status: None   Collection Time: 03/13/2018  2:39 PM  Result Value Ref Range Status   Enterococcus species NOT DETECTED NOT DETECTED Final   Listeria monocytogenes NOT DETECTED NOT DETECTED Final   Staphylococcus species NOT DETECTED NOT DETECTED Final   Staphylococcus aureus NOT DETECTED NOT DETECTED Final   Streptococcus species NOT DETECTED NOT DETECTED Final   Streptococcus agalactiae NOT DETECTED NOT DETECTED Final   Streptococcus pneumoniae NOT DETECTED NOT DETECTED Final   Streptococcus pyogenes NOT DETECTED NOT DETECTED Final   Acinetobacter baumannii NOT DETECTED NOT DETECTED Final   Enterobacteriaceae species NOT DETECTED NOT DETECTED Final   Enterobacter cloacae complex NOT DETECTED NOT DETECTED Final   Escherichia coli NOT DETECTED NOT DETECTED Final   Klebsiella oxytoca NOT DETECTED NOT DETECTED Final   Klebsiella pneumoniae NOT DETECTED NOT DETECTED Final   Proteus species NOT DETECTED NOT DETECTED Final   Serratia marcescens NOT DETECTED NOT DETECTED Final   Haemophilus influenzae NOT DETECTED NOT DETECTED Final   Neisseria meningitidis NOT DETECTED NOT DETECTED Final   Pseudomonas aeruginosa NOT DETECTED NOT DETECTED Final   Candida albicans NOT DETECTED NOT DETECTED Final   Candida glabrata NOT DETECTED NOT DETECTED Final   Candida krusei  NOT DETECTED NOT DETECTED Final   Candida parapsilosis NOT DETECTED NOT DETECTED Final   Candida tropicalis NOT DETECTED NOT DETECTED Final    Comment: Performed at [facility], [place] [address], [place], [place] [address]  Blood culture (routine x 2)     Status: None (Preliminary result)   Collection Time: 02/19/2018  2:53 PM  Result Value Ref Range Status   Specimen Description   Final    BLOOD RIGHT ANTECUBITAL Performed at [facility] [address], [place], [place] [address]    Special Requests   Final    BOTTLES DRAWN AEROBIC AND ANAEROBIC Blood Culture adequate volume Performed at [facility] [address], [place], [place] [address]    Culture   Final  NO GROWTH < 24 HOURS Performed at [facility] [address], [place], [place] [address]    Report Status PENDING  Incomplete  Body fluid culture     Status: None (Preliminary result)   Collection Time: 03/10/2018  5:15 PM  Result Value Ref Range Status   Specimen Description   Final    PLEURAL RIGHT Performed at [facility] [address], [place], [place] [address]    Special Requests   Final    NONE Performed at [facility], [place] [address], [place], [place] [address]    Gram Stain   Final    WBC PRESENT, PREDOMINANTLY MONONUCLEAR NO ORGANISMS SEEN Gram Stain Report Called to,Read Back By and Verified With: M.CATALAN AT 1913 ON 03/01/2018 BY N.THOMPSON CYTOSPIN SMEAR Performed at [facility] [address] [place], [place] [address]    Culture   Final    NO GROWTH 2 DAYS Performed at [facility] [address], [place], [place] [address]    Report Status PENDING  Incomplete  MRSA PCR Screening     Status: None   Collection Time: 03/17/2018  6:16 PM  Result Value Ref Range Status   MRSA by PCR NEGATIVE NEGATIVE Final    Comment:        The GeneXpert MRSA Assay (FDA approved for NASAL  specimens only), is one component of a comprehensive MRSA colonization surveillance program. It is not intended to diagnose MRSA infection nor to guide or monitor treatment for MRSA infections. Performed at [facility], [place] [address] [place], [place] [address]   Culture, respiratory (non-expectorated)     Status: None (Preliminary result)   Collection Time: 03/13/18  1:58 PM  Result Value Ref Range Status   Specimen Description   Final    TRACHEAL ASPIRATE Performed at [facility] [address], [place], [place] [address]    Special Requests   Final    NONE Performed at [facility], [place] [address] [place], [place] [address]    Gram Stain   Final    RARE WBC PRESENT, PREDOMINANTLY MONONUCLEAR NO ORGANISMS SEEN    Culture   Final    CULTURE REINCUBATED FOR BETTER GROWTH Performed at [facility], [place] [address], [place], [place] [address]    Report Status PENDING  Incomplete    Thayer Headings, [place] for Infectious Disease [place] [email or web address] [phone] pager  [phone] cell 03/14/2018, 3:36 PM

## 2018-03-15 ENCOUNTER — Inpatient Hospital Stay (HOSPITAL_COMMUNITY): Payer: Medicare Other

## 2018-03-15 ENCOUNTER — Ambulatory Visit (HOSPITAL_COMMUNITY): Payer: Medicare Other

## 2018-03-15 ENCOUNTER — Other Ambulatory Visit (HOSPITAL_COMMUNITY): Payer: Medicare Other

## 2018-03-15 DIAGNOSIS — L89000 Pressure ulcer of unspecified elbow, unstageable: Secondary | ICD-10-CM

## 2018-03-15 LAB — POCT I-STAT 3, ART BLOOD GAS (G3+)
ACID-BASE EXCESS: 4 mmol/L — AB (ref 0.0–2.0)
BICARBONATE: 31.3 mmol/L — AB (ref 20.0–28.0)
O2 SAT: 93 %
TCO2: 33 mmol/L — AB (ref 22–32)
pCO2 arterial: 59.8 mmHg — ABNORMAL HIGH (ref 32.0–48.0)
pH, Arterial: 7.327 — ABNORMAL LOW (ref 7.350–7.450)
pO2, Arterial: 75 mmHg — ABNORMAL LOW (ref 83.0–108.0)

## 2018-03-15 LAB — LACTIC ACID, PLASMA: LACTIC ACID, VENOUS: 0.9 mmol/L (ref 0.5–1.9)

## 2018-03-15 LAB — CSF CELL COUNT WITH DIFFERENTIAL
RBC Count, CSF: 11 /mm3 — ABNORMAL HIGH
Tube #: 1
WBC, CSF: 1 /mm3 (ref 0–5)

## 2018-03-15 LAB — CBC
HCT: 40.4 % (ref 36.0–46.0)
Hemoglobin: 12.5 g/dL (ref 12.0–15.0)
MCH: 28.9 pg (ref 26.0–34.0)
MCHC: 30.9 g/dL (ref 30.0–36.0)
MCV: 93.5 fL (ref 78.0–100.0)
PLATELETS: 341 10*3/uL (ref 150–400)
RBC: 4.32 MIL/uL (ref 3.87–5.11)
RDW: 17.7 % — AB (ref 11.5–15.5)
WBC: 16 10*3/uL — ABNORMAL HIGH (ref 4.0–10.5)

## 2018-03-15 LAB — PROCALCITONIN: PROCALCITONIN: 0.35 ng/mL

## 2018-03-15 LAB — COMPREHENSIVE METABOLIC PANEL
ALT: 18 U/L (ref 0–44)
AST: 27 U/L (ref 15–41)
Albumin: 2.1 g/dL — ABNORMAL LOW (ref 3.5–5.0)
Alkaline Phosphatase: 51 U/L (ref 38–126)
Anion gap: 8 (ref 5–15)
BUN: 31 mg/dL — ABNORMAL HIGH (ref 8–23)
CHLORIDE: 102 mmol/L (ref 98–111)
CO2: 27 mmol/L (ref 22–32)
CREATININE: 1.28 mg/dL — AB (ref 0.44–1.00)
Calcium: 7.8 mg/dL — ABNORMAL LOW (ref 8.9–10.3)
GFR, EST AFRICAN AMERICAN: 44 mL/min — AB (ref >60)
GFR, EST NON AFRICAN AMERICAN: 38 mL/min — AB (ref >60)
Glucose, Bld: 112 mg/dL — ABNORMAL HIGH (ref 70–99)
POTASSIUM: 4.1 mmol/L (ref 3.5–5.1)
Sodium: 137 mmol/L (ref 135–145)
Total Bilirubin: 1.5 mg/dL — ABNORMAL HIGH (ref 0.3–1.2)
Total Protein: 5.7 g/dL — ABNORMAL LOW (ref 6.5–8.1)

## 2018-03-15 LAB — BRAIN NATRIURETIC PEPTIDE: B Natriuretic Peptide: 835.3 pg/mL — ABNORMAL HIGH (ref 0.0–100.0)

## 2018-03-15 LAB — GLUCOSE, CAPILLARY
Glucose-Capillary: 105 mg/dL — ABNORMAL HIGH (ref 70–99)
Glucose-Capillary: 109 mg/dL — ABNORMAL HIGH (ref 70–99)

## 2018-03-15 LAB — TROPONIN I: Troponin I: <0.03 ng/mL (ref <0.03)

## 2018-03-15 LAB — MAGNESIUM: MAGNESIUM: 1.7 mg/dL (ref 1.7–2.4)

## 2018-03-15 MED ORDER — ATROPINE SULFATE 1 % OP SOLN
1.0000 [drp] | Freq: Four times a day (QID) | OPHTHALMIC | Status: DC | PRN
  Administered 2018-03-15: 2 [drp] via SUBLINGUAL
  Filled 2018-03-15: qty 2

## 2018-03-15 MED ORDER — ORAL CARE MOUTH RINSE: 15.0000 mL | Freq: Two times a day (BID) | OROMUCOSAL | Status: DC

## 2018-03-15 MED ORDER — CHLORHEXIDINE GLUCONATE 0.12 % MT SOLN: 15.0000 mL | Freq: Two times a day (BID) | OROMUCOSAL | Status: DC

## 2018-03-15 MED ORDER — CHLORHEXIDINE GLUCONATE 0.12 % MT SOLN
OROMUCOSAL | Status: AC
  Filled 2018-03-15: qty 15

## 2018-03-15 MED ORDER — MORPHINE 100MG IN NS 100ML (1MG/ML) PREMIX INFUSION
2.0000 mg/h | INTRAVENOUS | Status: DC
  Administered 2018-03-15: 2 mg/h via INTRAVENOUS
  Filled 2018-03-15: qty 100

## 2018-03-15 MED ORDER — ENOXAPARIN SODIUM 40 MG/0.4ML ~~LOC~~ SOLN: 40.0000 mg | SUBCUTANEOUS | Status: DC

## 2018-03-15 MED ORDER — MORPHINE SULFATE (PF) 2 MG/ML IV SOLN
2.0000 mg | INTRAVENOUS | Status: DC | PRN
  Administered 2018-03-15: 4 mg via INTRAVENOUS
  Administered 2018-03-15 (×2): 2 mg via INTRAVENOUS
  Filled 2018-03-15 (×2): qty 2

## 2018-03-15 MED ORDER — LORAZEPAM 2 MG/ML IJ SOLN
0.5000 mg | INTRAMUSCULAR | Status: DC | PRN
  Administered 2018-03-15: 1 mg via INTRAVENOUS
  Filled 2018-03-15: qty 1

## 2018-03-16 LAB — CULTURE, RESPIRATORY

## 2018-03-16 LAB — CULTURE, RESPIRATORY W GRAM STAIN

## 2018-03-16 LAB — VDRL, CSF: SYPHILIS VDRL QUANT CSF: NONREACTIVE

## 2018-03-16 LAB — BODY FLUID CULTURE: Culture: NO GROWTH

## 2018-03-16 LAB — CULTURE, BLOOD (ROUTINE X 2)

## 2018-03-17 LAB — CULTURE, BLOOD (ROUTINE X 2)
CULTURE: NO GROWTH
Special Requests: ADEQUATE

## 2018-03-17 LAB — CHOLESTEROL, BODY FLUID: CHOL FL: 96 mg/dL

## 2018-03-18 LAB — CSF CULTURE W GRAM STAIN: Culture: NO GROWTH

## 2018-03-18 LAB — CSF CULTURE

## 2018-03-18 LAB — OLIGOCLONAL BANDS, CSF + SERM

## 2018-03-21 NOTE — Progress Notes (Signed)
Patient calm and resting.

## 2018-03-21 NOTE — Progress Notes (Signed)
[facility] for Infectious Disease  Date of Admission:  03/14/2018             ASSESSMENT/PLAN  Jenny [name] has chronic respiratory failure. There is no evidence of meningitis on evaluation of CSF. She self-extubated today and is currently on a Venturi mask. Chest x-ray today with atelectasis or possible pleural effusion. There does not appear to be any clear evidence of pneumonia. Recommend watching off antibiotics at this time. Her prognosis appears to be very poor. Family continues to emphasize the importance of her comfort.   1. Discontinue Ampicillin, ceftriaxone, Anidulfungin, and vancomycin.  2. Continue to monitor WBC and respiratory culture.  ID will sign off and be available as needed.    Active Problems:   Hypoxemic respiratory failure, chronic (HCC)   Pressure injury of skin   . aspirin  81 mg Per Tube Daily  . chlorhexidine      . chlorhexidine gluconate (MEDLINE KIT)  15 mL Mouth Rinse BID  . citalopram  20 mg Oral Daily  . enoxaparin (LOVENOX) injection  40 mg Subcutaneous Q24H  . feeding supplement (VITAL AF 1.2 CAL)  1,000 mL Per Tube Q24H  . mouth rinse  15 mL Mouth Rinse 10 times per day  . miconazole nitrate   Topical QID  . pantoprazole sodium  40 mg Per Tube Daily    SUBJECTIVE:   Lumbar puncture performed with CSF appearing without evidence of meningitis. Respiratory culture remains in process. Afebrile since admission. Continues to have leukocytosis of 16. Blood and pleural fluid cultures remain with no growth. She self extubated this morning.   No Known Allergies   Review of Systems: Review of Systems  Unable to perform ROS: Acuity of condition      OBJECTIVE: Vitals:   03/28/2018 0630 28-Mar-2018 0700 03-28-18 0800 March 28, 2018 0900  BP: (!) 109/51 (!) 103/58 (!) 99/59 (!) 103/54  Pulse: 74 73 76 74  Resp: (!) 24 (!) 28 (!) 23 (!) 21  Temp:   (!) 97.5 F (36.4 C)   TempSrc:   Axillary   SpO2: 91% (!) 89% 92% 91%  Weight:      Height:        Body mass index is 37.3 kg/m.  Physical Exam  Constitutional: She is oriented to person, place, and time. She appears ill. No distress.  Cardiovascular: Normal rate, regular rhythm, normal heart sounds and intact distal pulses.  Pulmonary/Chest: Accessory muscle usage present. Tachypnea noted. No respiratory distress. She has rhonchi. She has rales.  Abdominal: Soft. Bowel sounds are normal. There is no tenderness.  Neurological: She is alert and oriented to person, place, and time.  Skin: Skin is warm and dry.  Psychiatric: She has a normal mood and affect. Her behavior is normal. Judgment and thought content normal.    Lab Results Lab Results  Component Value Date   WBC 16.0 (H) 03-28-2018   HGB 12.5 2018-03-28   HCT 40.4 2018/03/28   MCV 93.5 03-28-18   PLT 341 03/28/18    Lab Results  Component Value Date   CREATININE 1.28 (H) Mar 28, 2018   BUN 31 (H) 03-28-18   NA 137 03-28-18   K 4.1 03-28-18   CL 102 03/28/18   CO2 27 2018/03/28    Lab Results  Component Value Date   ALT 18 03/28/2018   AST 27 03/28/18   ALKPHOS 51 Mar 28, 2018   BILITOT 1.5 (H) 2018/03/28     Microbiology: Recent Results (from the past  240 hour(s))  Blood culture (routine x 2)     Status: None (Preliminary result)   Collection Time: 03/18/2018  2:39 PM  Result Value Ref Range Status   Specimen Description   Final    BLOOD LEFT ANTECUBITAL Performed at [facility] [address] [place], [place] [address]    Special Requests   Final    BOTTLES DRAWN AEROBIC AND ANAEROBIC Blood Culture results may not be optimal due to an excessive volume of blood received in culture bottles Performed at [facility] [address], [place], [place] [address]    Culture  Setup Time   Final    GRAM POSITIVE RODS AEROBIC BOTTLE ONLY CRITICAL RESULT CALLED TO, READ BACK BY AND VERIFIED WITH: B.MANCHERIL,PHARMD AT 4580 ON 03/14/18 BY G.MCADOO Performed  at [facility], [place] [address] [place], [place] [address]    Culture GRAM POSITIVE RODS  Final   Report Status PENDING  Incomplete  Blood Culture ID Panel (Reflexed)     Status: None   Collection Time: 03/10/2018  2:39 PM  Result Value Ref Range Status   Enterococcus species NOT DETECTED NOT DETECTED Final   Listeria monocytogenes NOT DETECTED NOT DETECTED Final   Staphylococcus species NOT DETECTED NOT DETECTED Final   Staphylococcus aureus NOT DETECTED NOT DETECTED Final   Streptococcus species NOT DETECTED NOT DETECTED Final   Streptococcus agalactiae NOT DETECTED NOT DETECTED Final   Streptococcus pneumoniae NOT DETECTED NOT DETECTED Final   Streptococcus pyogenes NOT DETECTED NOT DETECTED Final   Acinetobacter baumannii NOT DETECTED NOT DETECTED Final   Enterobacteriaceae species NOT DETECTED NOT DETECTED Final   Enterobacter cloacae complex NOT DETECTED NOT DETECTED Final   Escherichia coli NOT DETECTED NOT DETECTED Final   Klebsiella oxytoca NOT DETECTED NOT DETECTED Final   Klebsiella pneumoniae NOT DETECTED NOT DETECTED Final   Proteus species NOT DETECTED NOT DETECTED Final   Serratia marcescens NOT DETECTED NOT DETECTED Final   Haemophilus influenzae NOT DETECTED NOT DETECTED Final   Neisseria meningitidis NOT DETECTED NOT DETECTED Final   Pseudomonas aeruginosa NOT DETECTED NOT DETECTED Final   Candida albicans NOT DETECTED NOT DETECTED Final   Candida glabrata NOT DETECTED NOT DETECTED Final   Candida krusei NOT DETECTED NOT DETECTED Final   Candida parapsilosis NOT DETECTED NOT DETECTED Final   Candida tropicalis NOT DETECTED NOT DETECTED Final    Comment: Performed at [facility], [place] [address], [place], [place] [address]  Blood culture (routine x 2)     Status: None (Preliminary result)   Collection Time: 03/08/2018  2:53 PM  Result Value Ref Range Status   Specimen Description   Final    BLOOD RIGHT ANTECUBITAL Performed at [facility] [address], [place], [place] [address]    Special Requests   Final    BOTTLES DRAWN AEROBIC AND ANAEROBIC Blood Culture adequate volume Performed at [place] [address] [place], [place] [address]    Culture   Final    NO GROWTH 2 DAYS Performed at [facility] [address], [place], [place] [address]    Report Status PENDING  Incomplete  Body fluid culture     Status: None (Preliminary result)   Collection Time: 02/18/2018  5:15 PM  Result Value Ref Range Status   Specimen Description   Final    PLEURAL RIGHT Performed at [place] [facility] [address]  [place], [place] [address]    Special Requests   Final    NONE Performed at [facility], [place] [address] [place], [place] [address]    Gram Stain   Final    WBC PRESENT, PREDOMINANTLY MONONUCLEAR NO ORGANISMS SEEN Gram Stain Report Called to,Read Back By and Verified With: M.CATALAN AT 1913 ON 02/19/2018 BY N.THOMPSON CYTOSPIN SMEAR Performed at [facility] [address], [place], [place] [address]    Culture   Final    NO GROWTH 3 DAYS Performed at [facility], [place] [address] [place], [place] [address]    Report Status PENDING  Incomplete  MRSA PCR Screening     Status: None   Collection Time: 02/25/2018  6:16 PM  Result Value Ref Range Status   MRSA by PCR NEGATIVE NEGATIVE Final    Comment:        The GeneXpert MRSA Assay (FDA approved for NASAL specimens only), is one component of a comprehensive MRSA colonization surveillance program. It is not intended to diagnose MRSA infection nor to guide or monitor treatment for MRSA infections. Performed at [facility], [place] [address] [place], [place] [address]   Culture, respiratory (non-expectorated)     Status: None (Preliminary result)   Collection Time: 03/13/18  1:58 PM  Result Value Ref Range Status   Specimen Description   Final     TRACHEAL ASPIRATE Performed at [facility] [address] [place], [place] [address]    Special Requests   Final    NONE Performed at [facility], [place] [address] [place], [place] [address]    Gram Stain   Final    RARE WBC PRESENT, PREDOMINANTLY MONONUCLEAR NO ORGANISMS SEEN Performed at [facility], [place] [address] [place], [place] [address]    Culture RARE YEAST  Final   Report Status PENDING  Incomplete  CSF culture     Status: None (Preliminary result)   Collection Time: 03/14/18  9:15 PM  Result Value Ref Range Status   Specimen Description CSF  Final   Special Requests NONE  Final   Gram Stain   Final    WBC PRESENT, PREDOMINANTLY MONONUCLEAR NO ORGANISMS SEEN CYTOSPIN SMEAR    Culture   Final    NO GROWTH < 12 HOURS Performed at [facility], [place] [address], [place],  [address]    Report Status PENDING  Incomplete     Terri Piedra, [place] for [place] Pager  04/13/18  9:50 AM

## 2018-03-21 NOTE — Progress Notes (Addendum)
Jenny [name]  LKJ:[id number] DOB: 02/07/1934 DOA: 02/20/2018 PCP: Martinique, Betty G, MD    Reason for Consult / Chief Complaint:  Shortness of breath Respiratory failure  Consulting MD:  Dr. Venora Maples  HPI/Brief Narrative   84 y/o F, followed by Dr. Melvyn Novas, admitted 8/23 with reports of shortness of breath.  She was scheduled on 8/23 for a planned thoracentesis but breathing worsening prompting EMS transport to [facility].  She required HFNC O2 in ER but decompensated requiring intubation in the ER.  She was admitted to ICU, required vasopressors, treated with empiric antibiotics.  Course complicated by AKI & acute encephalopathy (noted 8/24).  Sedation was turned off 0800 and she was slow to wake.  Additionally, there were concerns for nuchal rigidity.  Neurology was consulted. The patient was transferred to [facility] on 8/25 for LP.  CT head was negative for acute process. Empiric medications for encephalopathy initiated.  Patient self extubated am 8/26 and made DNR/DNI.  Transitioned to comfort care (await all family arrival 8/26).    Assessment & Plan:    Acute Metabolic Encephalopathy - suspect in setting of AKI, sedation, hypotension, hypoxia.  CT head negative for acute process P: Neurology following  Follow LP results  Family feels transition to comfort measures appropriate    Acute Hypoxemic Respiratory Failure - suspect secondary to CHF, large right pleural effusion s/p  thoracentesis on 8/23 for 1700 cc of clear fluid.  Intubated 8/23. Self extubated am 8/26, made DNR/no re-intubation.  P: O2 as needed to support sats > 90% Pulmonary hygiene as able  Follow intermittent CXR PRN morphine for increased WOB or pain    Acute on Chronic Diastolic CHF, preserved LVEF.  Component of right heart with elevated pulmonary pressures - bi-atrial enlargement on prior ECHO Hypotension  Elevated Lactic Acid - suspect secondary to hypotension, poor renal clearance  P: Hold further ECHO given goals of  care Tele monitoring  Plan is to transition off levophed over 1 hour once all family here for comfort measures     Large Right Pleural Effusion - transudative by Lights.   P: No further CXR's or thoracentesis      Acute kidney injury Creatinine slowly down trending Hyperkalemia  P: Hold further labs    Sepsis - met criteria on admit, meningitis being ruled out  P: Follow CSF labs  No further procedures   Hx Pulmonary Nodules P: No acute interventions    Morbid Obesity  P: NPO / comfort feeding if patient requests    Best practice/Goals of care/disposition.   DVT prophylaxis: Enoxaparin/ PAS hose GI prophylaxis: Protonix Diet: NPO Mobility: Bedrest Code Status: DNR Family Communication: Family updated at bedside 8/26 on plan of care.  Multiple members present for discussion (son/HCPOA, daughter, niece, grandsons and daughter in Sports coach)  Disposition / Summary of Today's Plan 18-Mar-2018   Made DNR, no reintubation.  Plan to transition to full comfort care once family arrives.     Procedures: Thoracentesis 8/24 >> 1700 cc drained from the right pleural space, transudative, no organisms LP 8/25 >>   Significant Diagnostic Tests: CT head 8/24 >> mild atrophy and white matter changes are likely within normal limits for age. No acute or focal lesion to explain mental status changes or unresponsiveness.  Micro Data: Pleural Fluid 8/23 >> Sputum Culture 8/23 >> Blood 8/23 >> BCx2 8/23 >> GPR >> CSF Gram Stain 8/25  CSF Culture 8/25 >>    Antimicrobials:  Empirically started on vancomycin  and Zosyn Vancomycin 8/24 >> 8/26 Zosyn 8/23 >> 8/24 Eraxis 8/24 >> 8/26 Acyclovir 8/24>> 8/25 Ampicillin 8/24>>8/26 Rocephin 8/24 >>  Subjective  RN reports pt remains on 27 mcg's levophed, VM for O2.  Family at bedside, other members to arrive soon.    Objective    Examination: General: frail elderly female lying in bed HEENT: MM pink/dry, L IJ TLC c/d/i  Neuro: opens  eyes to voice, nods but does not follow commands, generalized weakness  CV: s1s2 irr PULM: even/non-labored, lungs bilaterally clear anterior, diminished R>L bibasilar with crackles  SJ:GGEZ, non-tender, bsx4 active  Extremities: warm/dry, generalized 1-2+ pitting edema  Skin: no rashes or lesions   Blood pressure (!) 103/54, pulse 74, temperature (!) 97.5 F (36.4 C), temperature source Axillary, resp. rate (!) 21, height _0  (1.575 m), weight 92.5 kg, SpO2 91 %.    Vent Mode: PRVC FiO2 (%):  [40 %] 40 % Set Rate:  [18 bmp] 18 bmp Vt Set:  [400 mL] 400 mL PEEP:  [5 cmH20] 5 cmH20 Plateau Pressure:  [14 cmH20-16 cmH20] 16 cmH20   Intake/Output Summary (Last 24 hours) at March 31, 2018 1059 Last data filed at March 31, 2018 0800 Gross per 24 hour  Intake 2503.5 ml  Output 1325 ml  Net 1178.5 ml   Filed Weights   03/13/18 0500 03/14/18 0213 03/31/2018 0500  Weight: 92.1 kg 92 kg 92.5 kg     Labs   CBC: Recent Labs  Lab 03/10/18 1545 03/20/2018 1447 02/26/2018 1943 03/14/18 0924 03-31-18 0502  WBC 7.3 12.2* 16.9* 16.8* 16.0*  NEUTROABS 5.4 8.9*  --   --   --   HGB 12.5 12.8 13.3 12.4 12.5  HCT 38.9 42.2 43.9 39.9 40.4  MCV 90.4 97.2 96.1 91.3 93.5  PLT 316.0 384 453* 355 662   Basic Metabolic Panel: Recent Labs  Lab 03/10/18 1545 03/10/2018 1714 03/13/18 0329 03/14/18 0431 03/14/18 0924 31-Mar-2018 0502  NA 140 145  --   --  136 137  K 4.7 5.7*  --   --  4.1 4.1  CL 97 101  --   --  100 102  CO2 35* 31  --   --  29 27  GLUCOSE 77 103*  --   --  127* 112*  BUN 48* 62*  --   --  41* 31*  CREATININE 1.31* 1.96* 1.83* 1.68* 1.52* 1.28*  CALCIUM 9.8 8.9  --   --  8.0* 7.8*  MG  --   --   --   --   --  1.7  PHOS  --   --   --   --  3.0  --    GFR: Estimated Creatinine Clearance: 35.9 mL/min (A) (by C-G formula based on SCr of 1.28 mg/dL (H)). Recent Labs  Lab 03/04/2018 1447 03/14/2018 1504 02/22/2018 1722 03/16/2018 1943 03/14/18 0924 03/31/2018 0502  PROCALCITON  --   --   --    --   --  0.35  WBC 12.2*  --   --  16.9* 16.8* 16.0*  LATICACIDVEN  --  2.16* 1.83  --   --  0.9   Liver Function Tests: Recent Labs  Lab 03/10/18 1545 02/20/2018 1714 03/14/18 0924 31-Mar-2018 0502  AST 27 54* 30 27  ALT _1 ALKPHOS 60 60 51 51  BILITOT 1.2 2.1* 1.4* 1.5*  PROT 6.9 6.7 5.3* 5.7*  ALBUMIN 3.2* 3.0* 2.0* 2.1*   No results for input(s): LIPASE, AMYLASE  in the last 168 hours. No results for input(s): AMMONIA in the last 168 hours. ABG    Component Value Date/Time   PHART 7.327 (L) 03-29-18 0436   PCO2ART 59.8 (H) 29-Mar-2018 0436   PO2ART 75.0 (L) Mar 29, 2018 0436   HCO3 31.3 (H) 03-29-18 0436   TCO2 33 (H) 03-29-2018 0436   O2SAT 93.0 03-29-2018 0436    Coagulation Profile: Recent Labs  Lab 03/14/18 0924  INR 1.44   Cardiac Enzymes: Recent Labs  Lab 03/10/2018 1447 03/29/18 0502  TROPONINI <0.03 <0.03   HbA1C: No results found for: HGBA1C CBG: Recent Labs  Lab 03/14/18 1612 03/14/18 1940 03/14/18 2331 2018/03/29 0348 03-29-18 0820  GLUCAP 120* 100* 102* 105* 109*     Review of Systems:    Unable as patient is intubated and sedated Past medical history   Past Medical History:  Diagnosis Date  . Anxiety   . Arthritis   . Hyperlipidemia   . Hypertension   . Osteoporosis    Social History   Social History   Socioeconomic History  . Marital status: Widowed    Spouse name: Not on file  . Number of children: Not on file  . Years of education: Not on file  . Highest education level: Not on file  Occupational History  . Not on file  Social Needs  . Financial resource strain: Not on file  . Food insecurity:    Worry: Not on file    Inability: Not on file  . Transportation needs:    Medical: Not on file    Non-medical: Not on file  Tobacco Use  . Smoking status: Never Smoker  . Smokeless tobacco: Never Used  Substance and Sexual Activity  . Alcohol use: No  . Drug use: No  . Sexual activity: Not Currently    Lifestyle  . Physical activity:    Days per week: Not on file    Minutes per session: Not on file  . Stress: Not on file  Relationships  . Social connections:    Talks on phone: Not on file    Gets together: Not on file    Attends religious service: Not on file    Active member of club or organization: Not on file    Attends meetings of clubs or organizations: Not on file    Relationship status: Not on file  . Intimate partner violence:    Fear of current or ex partner: Not on file    Emotionally abused: Not on file    Physically abused: Not on file    Forced sexual activity: Not on file  Other Topics Concern  . Not on file  Social History Narrative   Widowed 2012. 2 children (lost 3rd son unknown cause at 84). 9 grandchildren. 2 greatgrandchildren.    Lives alone. Does everything for herself except yardwork. 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 Worked at [employer] before that.       Hobbies: day trips with [facility], time with family, enjoys meal gatherings    Family history    Family History  Problem Relation Age of Onset  . Prostate cancer Father   . Hypertension Father   . Parkinsonism Brother     Allergies No Known Allergies  Home meds  Prior to Admission medications   Medication Sig Start Date End Date Taking? Authorizing Provider  ALPRAZolam Duanne Moron) 0.25 MG tablet Take 1 tablet (  0.25 mg total) by mouth daily as needed for anxiety. 09/22/17   Martinique, Betty G, MD  amLODipine-olmesartan (AZOR) 5-40 MG tablet TAKE 1 TABLET BY MOUTH EVERY DAY 03/01/18   Martinique, Betty G, MD  aspirin 81 MG tablet Take 81 mg by mouth daily.    [provider]  bisoprolol-hydrochlorothiazide (ZIAC) 5-6.25 MG tablet Take 1 tablet daily by mouth. 05/26/17   Martinique, Betty G, MD  citalopram (CELEXA) 20 MG tablet Take 1 tablet (20 mg total) by mouth daily. 12/28/17   Martinique, Betty G, MD  fenoprofen (NALFON) 600 MG TABS tablet Take  600 mg by mouth daily.    [provider]  fish oil-omega-3 fatty acids 1000 MG capsule Take 2 g by mouth daily.    [provider]  fluticasone (FLONASE) 50 MCG/ACT nasal spray Place 2 sprays into both nostrils daily. 01/19/18   Martinique, Betty G, MD  furosemide (LASIX) 20 MG tablet Take 2 tablets (40 mg total) by mouth daily. 03/01/18   Tanda Rockers, MD  [place] 300 MG CAPS Take 300 mg by mouth 2 (two) times daily.    [provider]  Multiple Vitamin (MULTIVITAMIN) tablet Take 1 tablet by mouth daily.      [provider]  nystatin (MYCOSTATIN/NYSTOP) powder Apply topically 4 (four) times daily. 03/05/18   Martinique, Betty G, MD  spironolactone (ALDACTONE) 25 MG tablet Take 1 tablet (25 mg total) by mouth daily. 02/11/18   Martyn Ehrich, NP  Vitamin D, Ergocalciferol, (DRISDOL) 50000 units CAPS capsule TAKE 1 CAPSULE EVERY OTHER WEEK 01/15/17   Marin Olp, MD     LOS: 3 days    Noe Gens, NP-C [facility] Pgr: [phone] or if no answer [phone] 04-11-2018, 10:59 AM

## 2018-03-21 NOTE — Progress Notes (Signed)
[facility]  Self extubated this morning.  Now with some tachypnea, weak cough.    I spoke to her son Benita GutterRussel on the phone this morning at length about the situation. She has been clear in the past that she does not want extended life supportive measures.  Given her age, the multiple chronic health problems and the severity of her acute illness I explained that the likelihood of a good outcome is difficult.    So based on our conversation we decided to not re-intubate her and to focus on her comfort if she declines.  The family is planning to come to see her now, they are 1 hour away.    Heber CarolinaBrent McQuaid, MD [facility] Pager: [phone] Cell: [phone] After 3pm or if no response, call [phone]

## 2018-03-21 NOTE — Death Summary Note (Signed)
DEATH SUMMARY   Patient Details  Name: Jenny [name] MRN: [id number] DOB: 22-Jun-1934  Admission/Discharge Information   Admit Date:  04-09-2018  Date of Death: Date of Death: April 12, 2018  Time of Death: Time of Death: 1611/12/24  Length of Stay: 3  Referring Physician: [place], Betty G, MD   Reason(s) for Hospitalization  Shortness of breath  Diagnoses  Preliminary cause of death:  Secondary Diagnoses (including complications and co-morbidities):  Active Problems:   Hypoxemic respiratory failure, chronic (HCC)   Pressure injury of skin   Brief Hospital Course (including significant findings, care, treatment, and services provided and events leading to death)  Jenny [name] is a 84 y.o. year old female who44 y/o F, followed by Dr. Sherene Sires, admitted 2023/04/10 with reports of shortness of breath.  She was scheduled on 04/10/2023 for a planned thoracentesis but breathing worsening prompting EMS transport to [facility].  She required HFNC O2 in ER but decompensated requiring intubation in the ER.  She was admitted to ICU, required vasopressors, treated with empiric antibiotics.  Course complicated by AKI & acute encephalopathy (noted 8/24).  Sedation was turned off 0800 and she was slow to wake.  Additionally, there were concerns for nuchal rigidity.  Neurology was consulted. The patient was transferred to [facility] on 8/25 for LP.  CT head was negative for acute process. Empiric medications for encephalopathy initiated.  Patient self extubated am April 13, 2023 and made DNR/DNI.  Transitioned to comfort care (await all family arrival 04/13/23).  Patient passed away in hospital.   Pertinent Labs and Studies  Significant Diagnostic Studies Dg Chest 1 View  Result Date: 02/18/2018 CLINICAL DATA:  Prior thoracentesis. EXAM: CHEST  1 VIEW COMPARISON:  02/10/2018. FINDINGS: Post thoracentesis chest x-ray reveals no evidence of pneumothorax. Significant reduction and right-sided pleural effusion. Cardiomegaly with normal pulmonary vascularity.  Bibasilar atelectasis. No acute bony abnormality. IMPRESSION: Post thoracentesis chest x-ray reveals no evidence of thoracentesis. Significant reduction in previously identified right pleural effusion. Electronically Signed   By: Maisie Fus  Register   On: 02/18/2018 11:25   Dg Chest 2 View  Result Date: 03/10/2018 CLINICAL DATA:  F/u rt pleural effusion. Pt c/o dyspnea. Hx of HTN. EXAM: CHEST - 2 VIEW COMPARISON:  02/18/2018 FINDINGS: Large right pleural effusion occupies approximately 60% of the right hemithorax. This has significantly increased from the prior study. There is opacity in the central right upper lobe consistent with atelectasis. The minor fissure is elevated superiorly. Clear left lung.  No left pleural effusion. No pneumothorax. Cardiac silhouette is mildly enlarged. Skeletal structures are demineralized. IMPRESSION: 1. Large right pleural effusion significantly increased when compared to the prior study. There is associated atelectasis at the right lung base and in the central right upper lobe. 2. No convincing pneumonia.  No evidence of pulmonary edema. Electronically Signed   By: Amie Portland M.D.   On: 03/10/2018 20:42   Ct Head Wo Contrast  Result Date: 03/13/2018 CLINICAL DATA:  Altered level of consciousness, unexplained. EXAM: CT HEAD WITHOUT CONTRAST TECHNIQUE: Contiguous axial images were obtained from the base of the skull through the vertex without intravenous contrast. COMPARISON:  None. FINDINGS: Brain: Mild atrophy and white matter changes are present bilaterally. This study is mildly degraded by patient motion. No acute infarct, hemorrhage, or mass lesion is present. Basal ganglia are intact. Acute or focal cortical lesions are present. The brainstem and cerebellum are normal. No significant extra-axial fluid collection is present. Vascular: No hyperdense vessel or unexpected calcification. Skull: Calvarium is intact.  No focal lytic or blastic lesions are present. No  significant extracranial soft tissue lesions present. Sinuses/Orbits: The paranasal sinuses and mastoid air cells are clear. Globes and orbits are within normal limits. IMPRESSION: 1. Mild atrophy and white matter changes are likely within normal limits for age. 2. No acute or focal lesion to explain mental status changes or unresponsiveness. Electronically Signed   By: Marin Roberts M.D.   On: 03/13/2018 17:15   Dg Chest Port 1 View  Result Date: 03/18/2018 CLINICAL DATA:  84 year old female with a history of shortness of breath EXAM: PORTABLE CHEST 1 VIEW COMPARISON:  03/14/2018 FINDINGS: Cardiomediastinal silhouette unchanged in size and contour, with the heart borders obscured by overlying lung/pleural disease. Unchanged left jugular approach central venous catheter which appears to terminate at the superior right atrium. Interval removal of endotracheal tube and gastric tube. Increased hazy opacity on the right with pleuroparenchymal thickening and obscuration of the right heart border and right hemidiaphragm. Retrocardiac opacity persists with obscuration of the left hemidiaphragm. IMPRESSION: Interval extubation and removal of gastric tube. Unchanged left IJ approach central venous catheter. Increased layering of right-sided pleural effusion, which may be partially loculated. Associated atelectasis/consolidation. Unchanged left basilar opacity, potentially atelectasis/consolidation and/or pleural effusion. Electronically Signed   By: Gilmer Mor D.O.   On: 02/23/2018 08:00   Dg Chest Port 1 View  Result Date: 03/14/2018 CLINICAL DATA:  Respiratory failure. EXAM: PORTABLE CHEST 1 VIEW COMPARISON:  Radiograph of March 13, 2018. FINDINGS: Stable cardiomegaly. Endotracheal and nasogastric tubes are unchanged in position. Left internal jugular catheter is seen with tip in right atrium. No pneumothorax is noted. Stable right pleural effusion is noted with associated atelectasis. Stable left  basilar opacity is noted concerning for atelectasis or infiltrate with probable pleural effusion. Thoracic scoliosis is again noted. IMPRESSION: Stable support apparatus. Stable bilateral lung opacities as described above, right greater than left. Electronically Signed   By: Lupita Raider, M.D.   On: 03/14/2018 11:58   Dg Chest Port 1 View  Result Date: 03/13/2018 CLINICAL DATA:  Central line placement EXAM: PORTABLE CHEST 1 VIEW COMPARISON:  April 07, 2018 FINDINGS: Moderate layering right pleural effusion. Small left pleural effusion. No pneumothorax. Endotracheal tube terminates 1.3 cm above the carina. Enteric tube is looped in the stomach. Left IJ venous catheter terminates in the right atrium, 5 cm below the cavoatrial junction. Cardiomegaly. IMPRESSION: Left IJ venous catheter terminates in the right atrium, 5 cm below the cavoatrial junction. Additional support apparatus as above. Moderate right pleural effusion, increased. Electronically Signed   By: Charline Bills M.D.   On: 03/13/2018 12:02   Dg Chest Port 1 View  Result Date: April 07, 2018 CLINICAL DATA:  Dyspnea and shortness of breath. EXAM: PORTABLE CHEST 1 VIEW COMPARISON:  2018-04-07 FINDINGS: Endotracheal tube tip 2.9 cm above the carina. Nasogastric tube enters the stomach. Markedly reduced size of the right pleural effusion, with only minimal residual blunting of the right lateral costophrenic angle and trace fluid in the minor fissure. There is some fine interstitial accentuation in the right lung possibly from mild re-expansion edema. Low lung volumes are present, causing crowding of the pulmonary vasculature. The left lung appears grossly clear. Atherosclerotic calcification of the aortic arch. Thoracic scoliosis. No appreciable pneumothorax. IMPRESSION: 1. No appreciable pneumothorax status post right thoracentesis. Minimal residual right pleural effusion. Faint interstitial accentuation in the right lung, possibly from some degree of  re-expansion edema. 2.  Atherosclerotic calcification of the aortic arch. Electronically Signed   By:  Gaylyn Rong M.D.   On: Mar 27, 2018 17:56   Dg Chest Portable 1 View  Result Date: 03/27/18 CLINICAL DATA:  Respiratory failure status post intubation. EXAM: PORTABLE CHEST 1 VIEW COMPARISON:  Chest x-ray dated March 10, 2018. FINDINGS: Interval placement of an endotracheal tube with the tip approximately 3.2 cm above the carina. Enteric tube seen entering the stomach with the tip below the field of view. Stable cardiomegaly. Large loculated right pleural effusion with consolidation/atelectasis in the right lower lobe. The left lung is clear. No pneumothorax. No acute osseous abnormality. IMPRESSION: 1. Appropriately positioned endotracheal tube. 2. Large loculated right pleural effusion with right lower lobe collapse/consolidation. Electronically Signed   By: Obie Dredge M.D.   On: 03/27/2018 15:19   Dg Abd Portable 1 View  Result Date: 03-27-18 CLINICAL DATA:  OG tube placement. EXAM: PORTABLE ABDOMEN - 1 VIEW COMPARISON:  None. FINDINGS: OG tube tip is in the distal stomach. Visualized bowel gas pattern is normal. Thoracolumbar scoliosis. Large right pleural effusion. Slight atelectasis at the left lung base medially. IMPRESSION: 1. OG tube tip in the distal stomach in good position. 2. Large right pleural effusion. Electronically Signed   By: Francene Boyers M.D.   On: 27-Mar-2018 15:18   US Thoracentesis Asp Pleural Space W/img Guide  Result Date: 02/18/2018 INDICATION: Patient with history of shortness of breath and right pleural effusion. Request is made for diagnostic and therapeutic right thoracentesis. EXAM: ULTRASOUND GUIDED DIAGNOSTIC AND THERAPEUTIC RIGHT THORACENTESIS MEDICATIONS: 10 mL of 2% lidocaine COMPLICATIONS: None immediate. PROCEDURE: An ultrasound guided thoracentesis was thoroughly discussed with the patient and questions answered. The benefits, risks, alternatives and  complications were also discussed. The patient understands and wishes to proceed with the procedure. Written consent was obtained. Ultrasound was performed to localize and mark an adequate pocket of fluid in the right chest. The area was then prepped and draped in the normal sterile fashion. 2% Lidocaine was used for local anesthesia. Under ultrasound guidance a 6 Fr Safe-T-Centesis catheter was introduced. Thoracentesis was performed. The catheter was removed and a dressing applied. FINDINGS: A total of approximately 2 L of clear gold fluid was removed. Samples were sent to the laboratory as requested by the clinical team. IMPRESSION: Successful ultrasound guided right thoracentesis yielding 2 L of pleural fluid. Read by: Elwin Mocha, PA-C Electronically Signed   By: Gilmer Mor D.O.   On: 02/18/2018 12:14   Dg Fluoro Guide Lumbar Puncture  Result Date: 03/14/2018 CLINICAL DATA:  Altered mental status EXAM: DIAGNOSTIC LUMBAR PUNCTURE UNDER FLUOROSCOPIC GUIDANCE FLUOROSCOPY TIME:  Fluoroscopy Time:  54 seconds Radiation Exposure Index (if provided by the fluoroscopic device): Number of Acquired Spot Images: 0 PROCEDURE: Informed consent was obtained from the patient prior to the procedure, including potential complications of headache, allergy, and pain. With the patient prone, the lower back was prepped with Betadine. 1% Lidocaine was used for local anesthesia. Lumbar puncture was performed at the L3-4 level using a 20 gauge needle with return of clear CSF with an opening pressure of 32 cm water. Eight ml of CSF were obtained for laboratory studies. The patient tolerated the procedure well and there were no apparent complications. IMPRESSION: Technically successful lumbar puncture under fluoroscopic guidance as above. Electronically Signed   By: Charlett Nose M.D.   On: 03/14/2018 22:04    Microbiology Recent Results (from the past 240 hour(s))  Blood culture (routine x 2)     Status: None (Preliminary  result)   Collection Time:  03/11/2018  2:39 PM  Result Value Ref Range Status   Specimen Description   Final    BLOOD LEFT ANTECUBITAL Performed at [facility], [address], [place], [place] [address]    Special Requests   Final    BOTTLES DRAWN AEROBIC AND ANAEROBIC Blood Culture results may not be optimal due to an excessive volume of blood received in culture bottles Performed at [facility], [address], [place], [place] [address]    Culture  Setup Time   Final    GRAM POSITIVE RODS AEROBIC BOTTLE ONLY CRITICAL RESULT CALLED TO, READ BACK BY AND VERIFIED WITH: B.MANCHERIL,PHARMD AT 0272 ON 03/14/18 BY G.MCADOO Performed at [facility], [address], [place], [place] [address]    Culture GRAM POSITIVE RODS  Final   Report Status PENDING  Incomplete  Blood Culture ID Panel (Reflexed)     Status: None   Collection Time: 03/07/2018  2:39 PM  Result Value Ref Range Status   Enterococcus species NOT DETECTED NOT DETECTED Final   Listeria monocytogenes NOT DETECTED NOT DETECTED Final   Staphylococcus species NOT DETECTED NOT DETECTED Final   Staphylococcus aureus NOT DETECTED NOT DETECTED Final   Streptococcus species NOT DETECTED NOT DETECTED Final   Streptococcus agalactiae NOT DETECTED NOT DETECTED Final   Streptococcus pneumoniae NOT DETECTED NOT DETECTED Final   Streptococcus pyogenes NOT DETECTED NOT DETECTED Final   Acinetobacter baumannii NOT DETECTED NOT DETECTED Final   Enterobacteriaceae species NOT DETECTED NOT DETECTED Final   Enterobacter cloacae complex NOT DETECTED NOT DETECTED Final   Escherichia coli NOT DETECTED NOT DETECTED Final   Klebsiella oxytoca NOT DETECTED NOT DETECTED Final   Klebsiella pneumoniae NOT DETECTED NOT DETECTED Final   Proteus species NOT DETECTED NOT DETECTED Final   Serratia marcescens NOT DETECTED NOT DETECTED Final   Haemophilus influenzae NOT DETECTED NOT DETECTED Final   Neisseria  meningitidis NOT DETECTED NOT DETECTED Final   Pseudomonas aeruginosa NOT DETECTED NOT DETECTED Final   Candida albicans NOT DETECTED NOT DETECTED Final   Candida glabrata NOT DETECTED NOT DETECTED Final   Candida krusei NOT DETECTED NOT DETECTED Final   Candida parapsilosis NOT DETECTED NOT DETECTED Final   Candida tropicalis NOT DETECTED NOT DETECTED Final    Comment: Performed at [facility], [address], [place], [place] [address]  Blood culture (routine x 2)     Status: None (Preliminary result)   Collection Time: 03/04/2018  2:53 PM  Result Value Ref Range Status   Specimen Description   Final    BLOOD RIGHT ANTECUBITAL Performed at [facility], [address], [place], [place] [address]    Special Requests   Final    BOTTLES DRAWN AEROBIC AND ANAEROBIC Blood Culture adequate volume Performed at [facility], [address], [place], [place] [address]    Culture   Final    NO GROWTH 3 DAYS Performed at [facility], [address], [place], [place] [address]    Report Status PENDING  Incomplete  Body fluid culture     Status: None (Preliminary result)   Collection Time: 03/14/2018  5:15 PM  Result Value Ref Range Status   Specimen Description   Final    PLEURAL RIGHT Performed at [facility], [address] [place], [place] [address]    Special Requests   Final    NONE Performed at [facility],  [address] [place], [place] [address]    Gram Stain   Final    WBC PRESENT, PREDOMINANTLY MONONUCLEAR NO ORGANISMS SEEN Gram Stain Report Called to,Read Back By and Verified With: M.CATALAN AT 1913 ON 04/02/2018 BY N.THOMPSON CYTOSPIN SMEAR Performed at [facility], [address], [place], [place] [address]    Culture   Final    NO GROWTH 3 DAYS Performed at [facility], [address], [place], [place] [address]    Report Status PENDING  Incomplete   MRSA PCR Screening     Status: None   Collection Time: 2018/04/02  6:16 PM  Result Value Ref Range Status   MRSA by PCR NEGATIVE NEGATIVE Final    Comment:        The GeneXpert MRSA Assay (FDA approved for NASAL specimens only), is one component of a comprehensive MRSA colonization surveillance program. It is not intended to diagnose MRSA infection nor to guide or monitor treatment for MRSA infections. Performed at [facility], [address] [place], [place] [address]   Culture, respiratory (non-expectorated)     Status: None (Preliminary result)   Collection Time: 03/13/18  1:58 PM  Result Value Ref Range Status   Specimen Description   Final    TRACHEAL ASPIRATE Performed at [facility], [address] [place], [place] [address]    Special Requests   Final    NONE Performed at [facility], [address], [place], [place] [address]    Gram Stain   Final    RARE WBC PRESENT, PREDOMINANTLY MONONUCLEAR NO ORGANISMS SEEN Performed at [facility], [address], [place], [place] [address]    Culture RARE YEAST  Final   Report Status PENDING  Incomplete  CSF culture     Status: None (Preliminary result)   Collection Time: 03/14/18  9:15 PM  Result Value Ref Range Status   Specimen Description CSF  Final   Special Requests NONE  Final   Gram Stain   Final    WBC PRESENT, PREDOMINANTLY MONONUCLEAR NO ORGANISMS SEEN CYTOSPIN SMEAR    Culture   Final    NO GROWTH < 12 HOURS Performed at [facility], [address] [place], [place] [address]    Report Status PENDING  Incomplete    Lab Basic Metabolic Panel: Recent Labs  Lab 03/10/18 1545 02-Apr-2018 1714 03/13/18 0329 03/14/18 0431 03/14/18 0924 03/14/2018 0502  NA 140 145  --   --  136 137  K 4.7 5.7*  --   --  4.1 4.1  CL 97 101  --   --  100 102  CO2 35* 31  --   --  29 27  GLUCOSE 77 103*  --   --  127* 112*  BUN 48* 62*  --   --   41* 31*  CREATININE 1.31* 1.96* 1.83* 1.68* 1.52* 1.28*  CALCIUM 9.8 8.9  --   --  8.0* 7.8*  MG  --   --   --   --   --  1.7  PHOS  --   --   --   --  3.0  --    Liver Function Tests: Recent Labs  Lab 03/10/18 1545 04/02/18 1714 03/14/18 0924 03/01/2018 0502  AST 27 54* 30 27  ALT 17 27 17 18   ALKPHOS 60 60 51 51  BILITOT 1.2 2.1* 1.4* 1.5*  PROT 6.9  6.7 5.3* 5.7*  ALBUMIN 3.2* 3.0* 2.0* 2.1*   No results for input(s): LIPASE, AMYLASE in the last 168 hours. No results for input(s): AMMONIA in the last 168 hours. CBC: Recent Labs  Lab 03/10/18 1545 Dec 15, 2017 1447 Dec 15, 2017 1943 03/14/18 0924 03/08/2018 0502  WBC 7.3 12.2* 16.9* 16.8* 16.0*  NEUTROABS 5.4 8.9*  --   --   --   HGB 12.5 12.8 13.3 12.4 12.5  HCT 38.9 42.2 43.9 39.9 40.4  MCV 90.4 97.2 96.1 91.3 93.5  PLT 316.0 384 453* 355 341   Cardiac Enzymes: Recent Labs  Lab Dec 15, 2017 1447 03/16/2018 0502  TROPONINI <0.03 <0.03   Sepsis Labs: Recent Labs  Lab Dec 15, 2017 1447 Dec 15, 2017 1504 Dec 15, 2017 1722 Dec 15, 2017 1943 03/14/18 0924 03/05/2018 0502  PROCALCITON  --   --   --   --   --  0.35  WBC 12.2*  --   --  16.9* 16.8* 16.0*  LATICACIDVEN  --  2.16* 1.83  --   --  0.9    Procedures/Operations  Mechanical ventilation.   Donovin Kraemer 02/27/2018, 5:32 PM

## 2018-03-21 NOTE — Progress Notes (Signed)
Called to bedside by RN for concerns of increased work of breathing despite morphine administration.  Family also concerned that patient is uncomfortable.  Mild abdominal accessory muscle use on exam, face is relaxed.  No gurgling.     Plan: Add low dose morphine gtt for comfort  PRN ativan  PRN atropine SL for increased secretions   Canary BrimBrandi Mcguire Gasparyan, NP-C [facility] Pgr: [phone] or if no answer [phone] January 10, 2018, 2:27 PM

## 2018-03-21 NOTE — Procedures (Signed)
Extubation Procedure Note  Patient Details:   Name: Jenny [name] DOB: 12/25/1933 MRN: [id number]   Airway Documentation:  Airway 7.5 mm (Active)  Secured at (cm) 23 cm 10-17-2017  3:20 AM  Measured From Lips 10-17-2017  3:20 AM  Secured Location Left 10-17-2017  3:20 AM  Secured By [employer] Tube Holder 10-17-2017  3:20 AM  Tube Holder Repositioned Yes 10-17-2017  3:20 AM  Cuff Pressure (cm H2O) 30 cm H2O 03/14/2018  7:36 PM  Site Condition Dry 10-17-2017  3:20 AM   Vent end date: 12/24/17 Vent end time: 0349   Evaluation  O2 sats: stable throughout and currently acceptable Complications: No apparent complications Patient did tolerate procedure well. Bilateral Breath Sounds: Clear, Diminished, Rhonchi  Patient self extubated herself. Placed patient on 4lpm nasal cannula. Will draw an ABG with in the hour. Yes  Leafy HalfSnider, Emre [place] 10-17-2017, 3:49 AM

## 2018-03-21 NOTE — Progress Notes (Signed)
Patient self extubated. Myself and another RN were in the room immediately, as was respiratory therapy. E-Link MD made aware, and critical care NP now at bedside. Current vitals are spO2 94%, heart rate 74, blood pressure 95/68, respiration rate 28. No stridor auscultated. Will continue to monitor.

## 2018-03-21 NOTE — Progress Notes (Signed)
Lp negative for meningitis.  Reviewed notes from this morning, patient self extubated and family decided to make patient comfort care.   Will sign off.

## 2018-03-21 NOTE — Progress Notes (Signed)
Patient transitioned to comfort care per family and patient wishes. Pressors removed and morphine started. Pt remained comfortable with family at bedside. Patient time of death [id number] confirmed by Bartholomew CrewsAmanda Bruce Churilla RN and Londell MohKaty Walton RN, absent lungs sounds during auscultation for 2 minutes.   Patient restrained overnight while intubated, confirmed with ME not an ME case. CDS notified of time of death, not eligible for organ donation. All lines removed.  80 mL of morphine wasted in sink with Emelia Salisburyarrie Craver, RN

## 2018-03-21 NOTE — Progress Notes (Signed)
Arterial blood gas drawn on 40% venturi mask

## 2018-03-21 NOTE — Progress Notes (Signed)
1 mg versed wasted with shelby, rn. Unable to waste in pyxis [facility]

## 2018-03-21 DEATH — deceased

## 2018-03-23 ENCOUNTER — Telehealth: Payer: Self-pay

## 2018-03-23 NOTE — Telephone Encounter (Signed)
On 03/23/18 I received a d/c from [facility] (original). The d/c is for burial.   The patient is a patient of Doctor Agarwala.   The d/c will be taken to [address] [place] for signature.  On 03/24/18 I received the d/c back from Doctor Agarwala.   I got the d/c ready and called the funeral home to let them know the d/c was ready for pickup.

## 2018-03-29 ENCOUNTER — Other Ambulatory Visit: Payer: Self-pay | Admitting: Internal Medicine

## 2018-03-29 DIAGNOSIS — J90 Pleural effusion, not elsewhere classified: Secondary | ICD-10-CM

## 2018-04-06 ENCOUNTER — Other Ambulatory Visit: Payer: Self-pay | Admitting: Family Medicine

## 2018-04-08 ENCOUNTER — Encounter: Payer: Medicare Other | Admitting: Adult Health

## 2018-04-13 LAB — FUNGUS CULTURE RESULT

## 2018-04-13 LAB — FUNGUS CULTURE WITH STAIN

## 2018-04-13 LAB — FUNGAL ORGANISM REFLEX

## 2018-04-14 ENCOUNTER — Ambulatory Visit: Payer: Medicare Other | Admitting: Family Medicine

## 2018-05-06 ENCOUNTER — Other Ambulatory Visit: Payer: Self-pay | Admitting: Primary Care

## 2018-09-24 ENCOUNTER — Ambulatory Visit: Payer: Medicare Other

## 2019-06-09 IMAGING — DX DG CHEST 2V
2 series · 2 of 2 positions shown · non-contrast
Comparison: No prior .

CLINICAL DATA: Dry cough .

EXAM:
CHEST  2 VIEW

[chest pa]
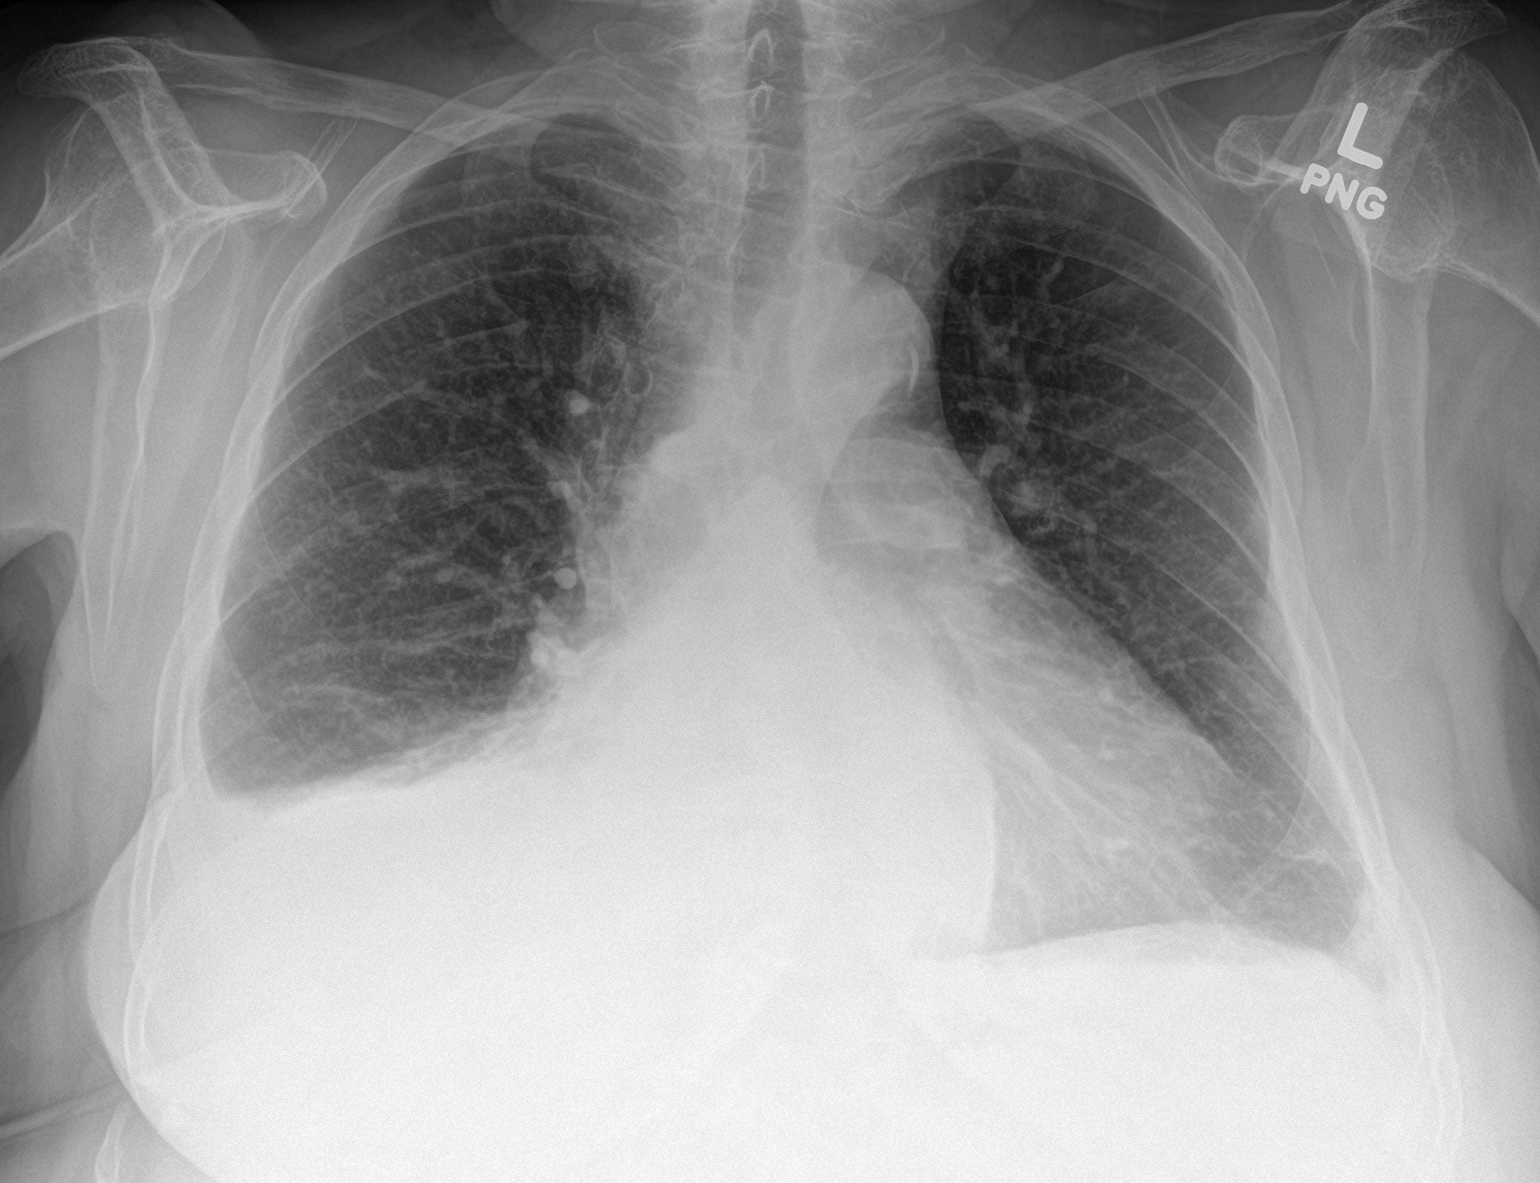

[chest lat]
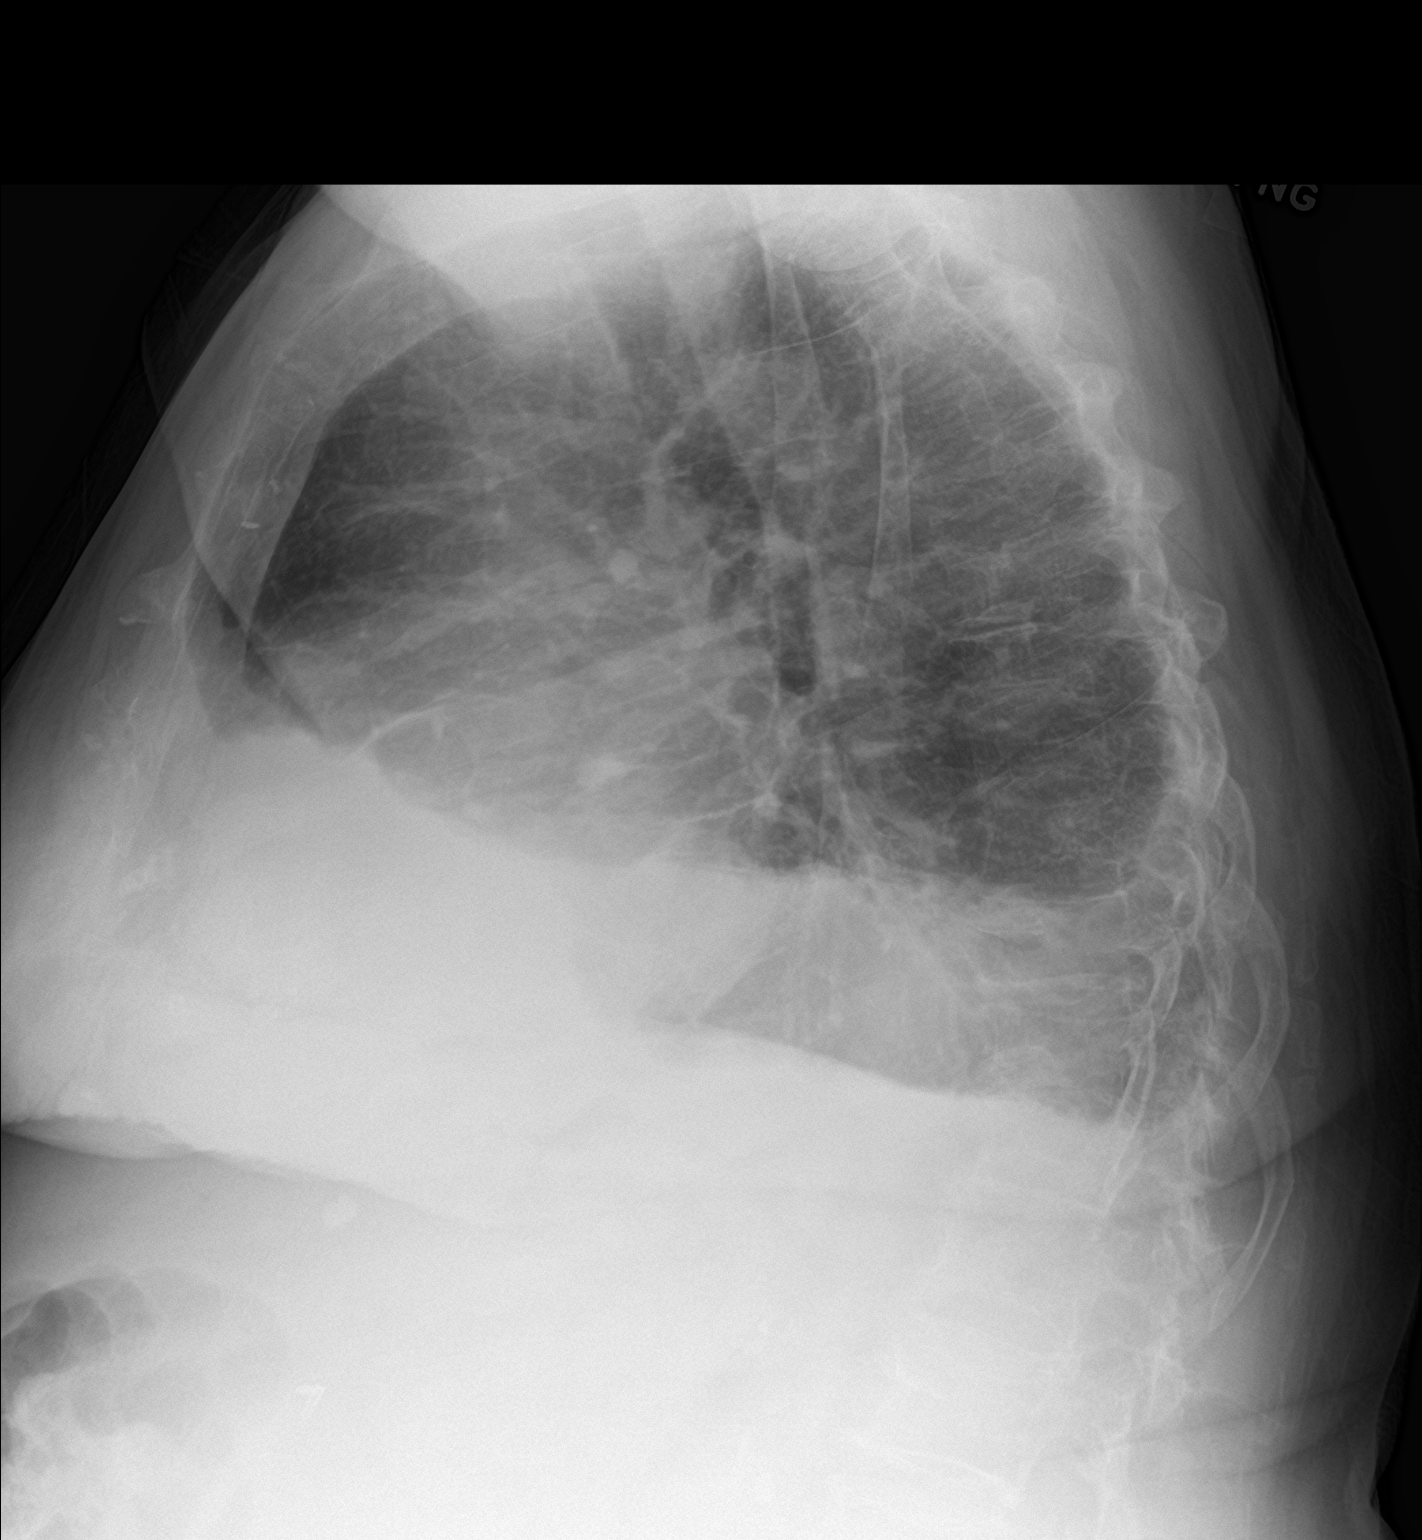

[2 of 2 positions shown; findings below may reference images not displayed]

FINDINGS: Mediastinum and hilar structures normal. Cardiomegaly with normal
pulmonary vascularity. Right lower lobe atelectasis and
consolidation with right-sided pleural effusion. Small left pleural
effusion. Degenerative changes thoracic spine.
IMPRESSION: 1. Right lower lobe atelectasis and consolidation with moderate
right-sided pleural effusion. Small left pleural effusion.

2. Cardiomegaly .

## 2019-07-03 IMAGING — DX DG CHEST 2V
2 series · 2 of 2 positions shown · non-contrast
Comparison: 03/03/2017

CLINICAL DATA: 83-year-old female with a history of follow-up right
pleural effusion

EXAM:
CHEST  2 VIEW

[chest pa]
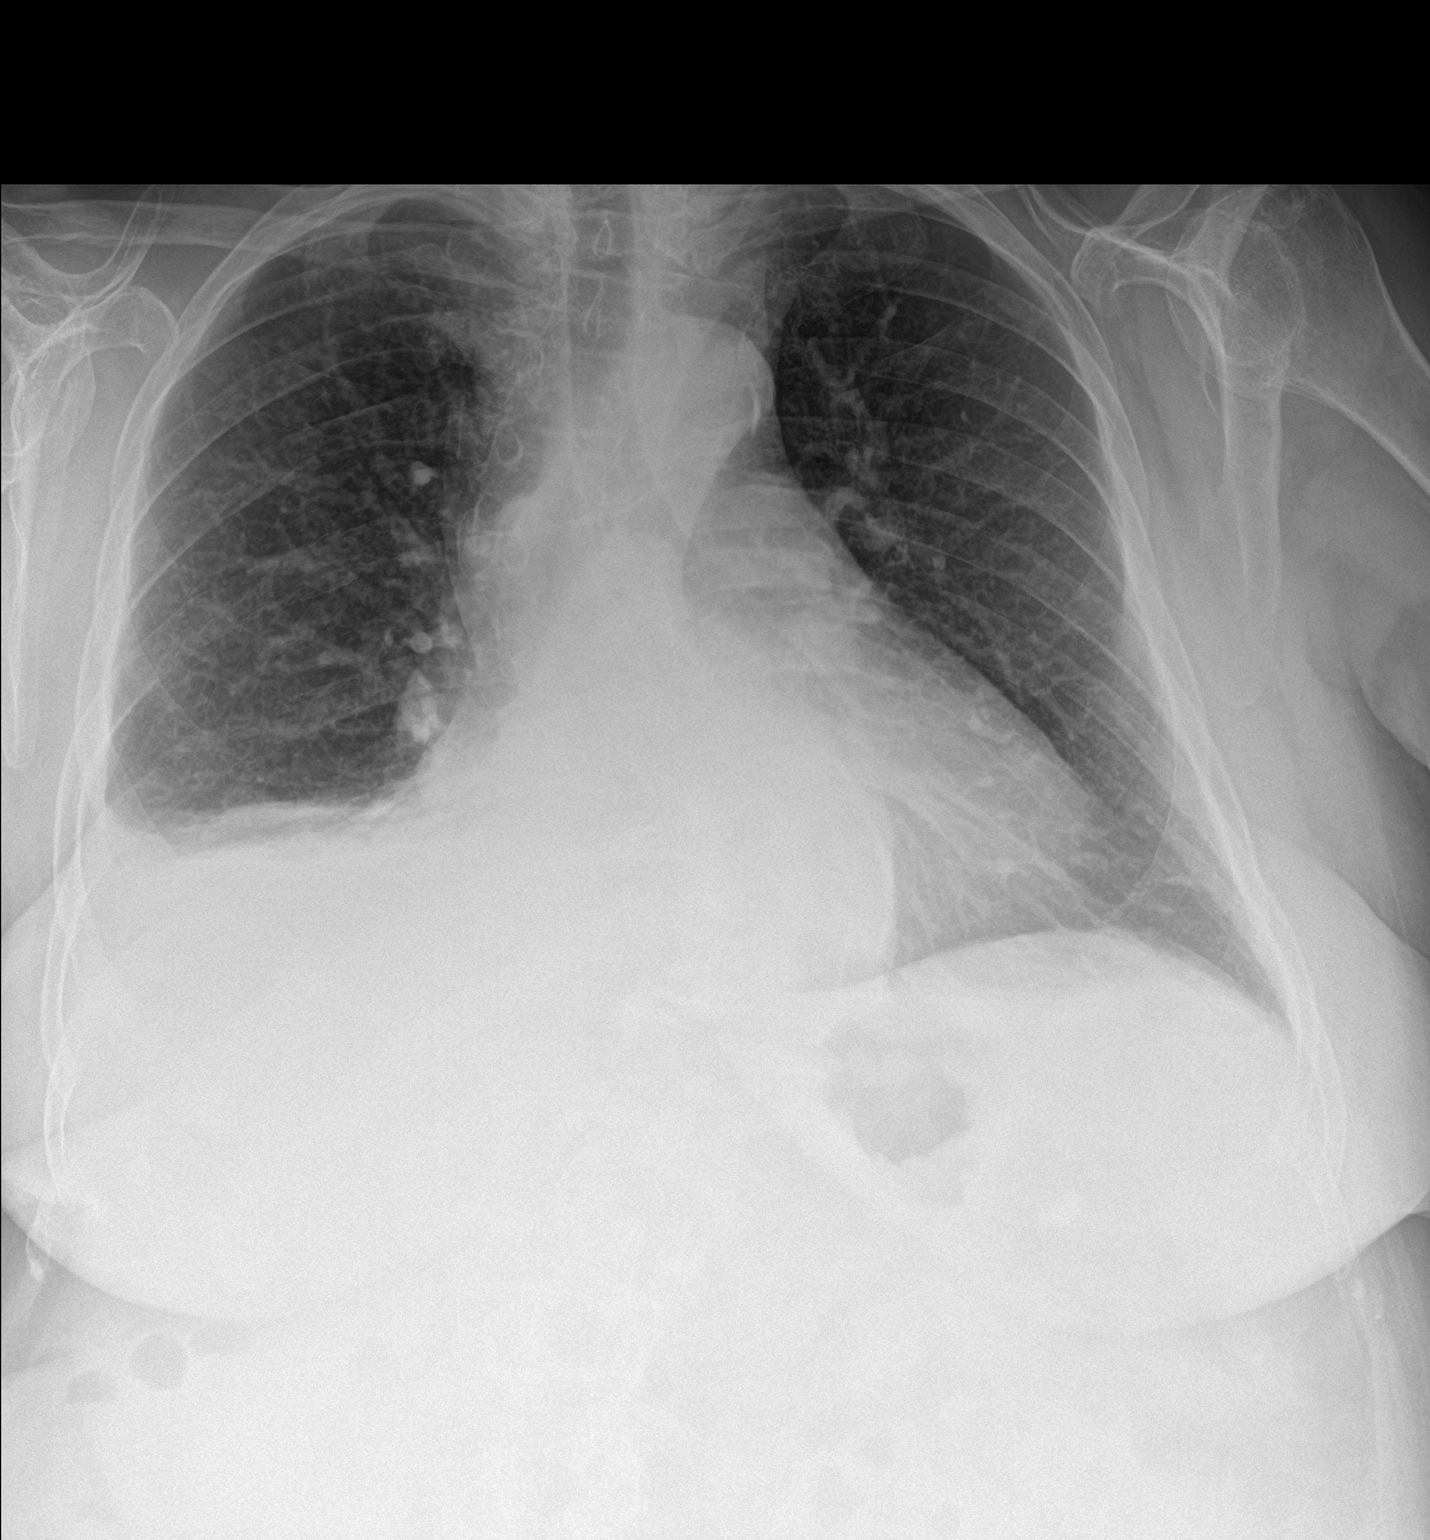

[chest lat]
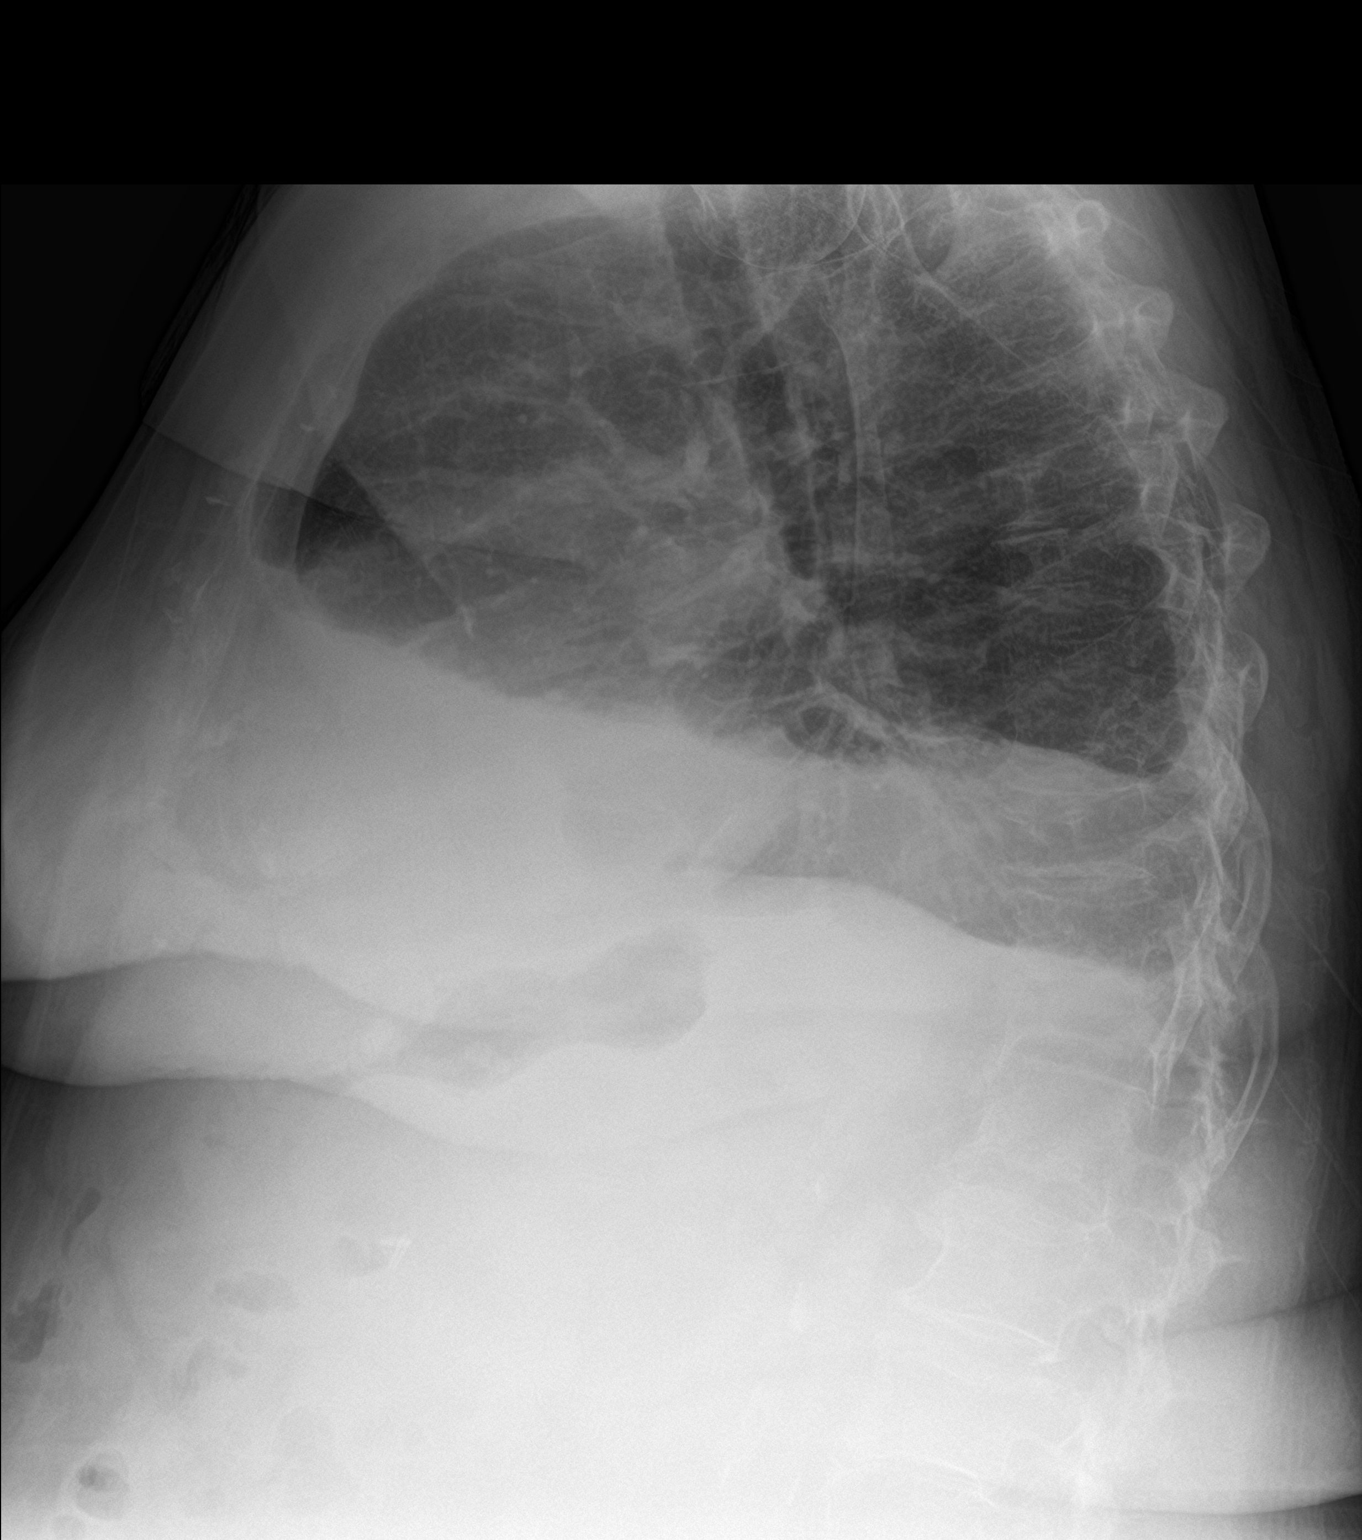

[2 of 2 positions shown; findings below may reference images not displayed]

FINDINGS: Cardiomediastinal silhouette unchanged with cardiomegaly. No
evidence of central vascular congestion.

Tortuosity of the thoracic aorta. Calcifications of the aortic arch.

Opacity at the right lung base obscuring the right hemidiaphragm and
right heart border, unchanged.

No pneumothorax. No new confluent airspace disease. Lateral view
again demonstrates opacity at the posterior lung base. Appearance is
similar to the comparison.

No displaced fracture.

Degenerative changes of the spine.
IMPRESSION: Unchanged appearance of right-sided opacity at the base of the lung,
likely a combination of pleural effusion and atelectasis/
consolidation.

Cardiomegaly

## 2019-07-28 IMAGING — CT CT ANGIO CHEST
2 of 7 series · 16 of 46 positions shown · IV contrast (isovue)
Comparison: Chest radiograph - 03/27/2017; 03/03/2017

CLINICAL DATA: Increased shortness of breath on exertion. Evaluate
for pulmonary embolism.

EXAM:
CT ANGIOGRAPHY CHEST WITH CONTRAST
TECHNIQUE: Multidetector CT imaging of the chest was performed using the
standard protocol during bolus administration of intravenous
contrast. Multiplanar CT image reconstructions and MIPs were
obtained to evaluate the vascular anatomy.
CONTRAST:  80 cc Isovue 370

[Series 5: thins · axial · 0.68mm/px · z∈[-300,-47]mm · 13 of 279 slices shown]
[im 13/279  lung]
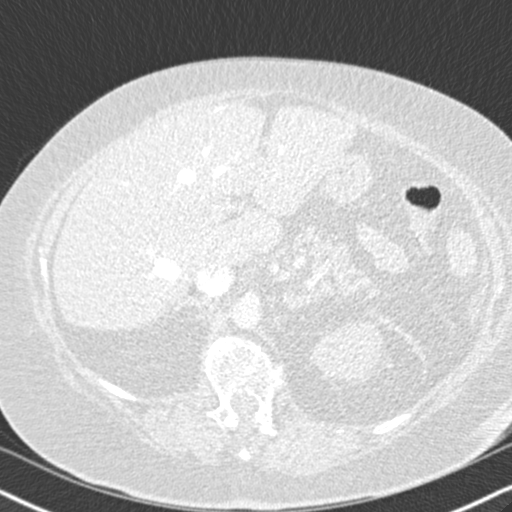
[im 37/279  soft-tissue]
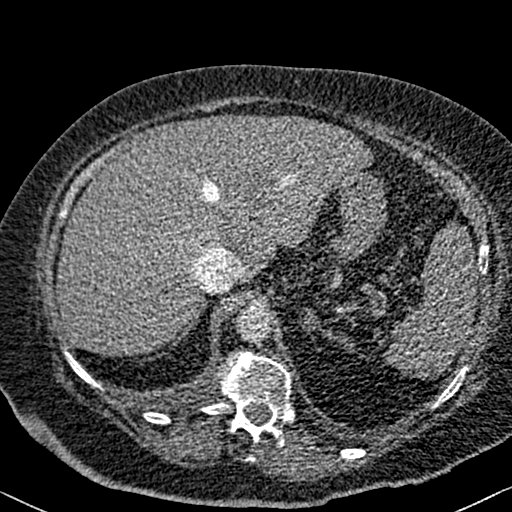
[im 61/279  lung]
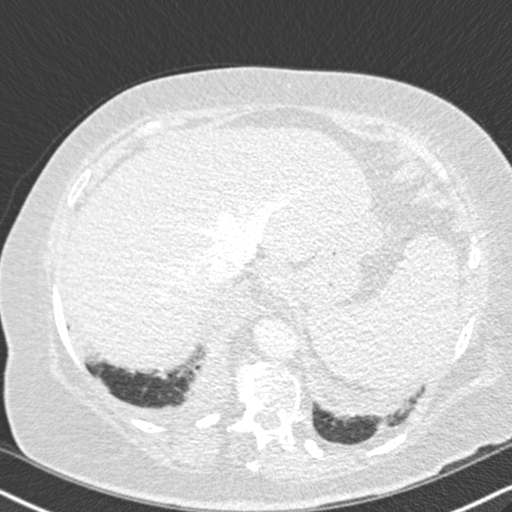
[im 73/279  soft-tissue]
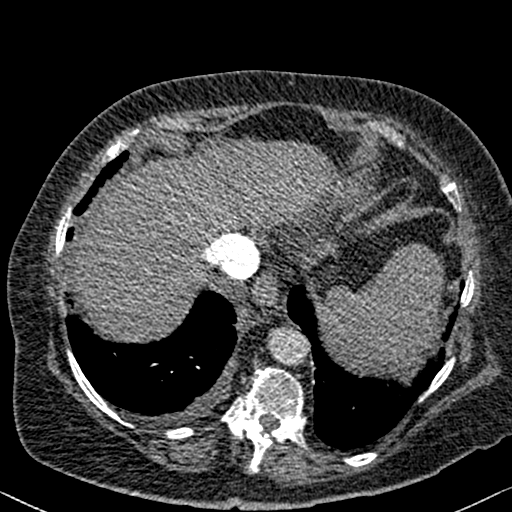
[im 97/279  lung]
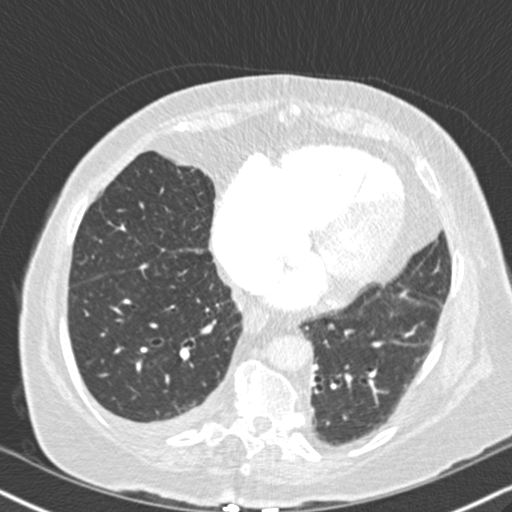
[im 121/279  soft-tissue]
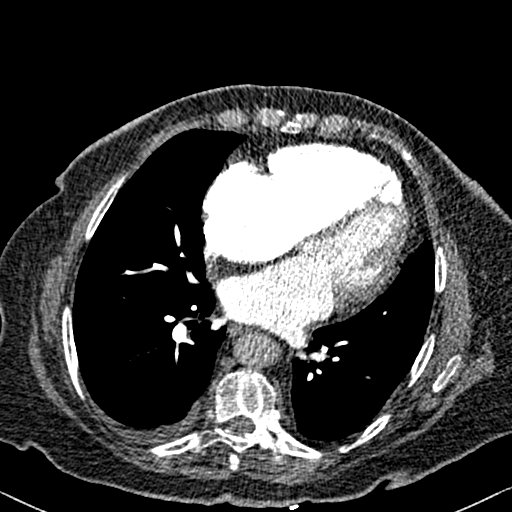
[im 146/279  lung]
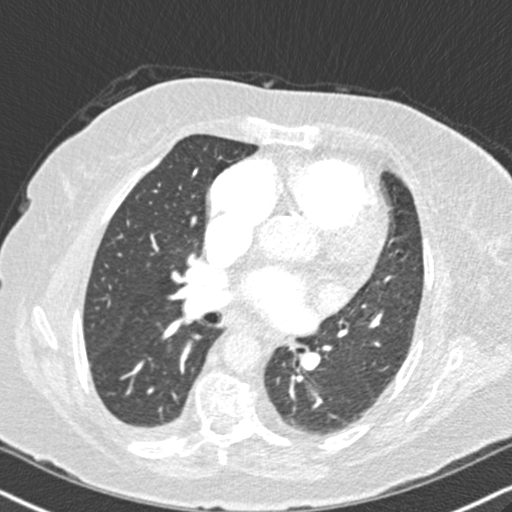
[im 158/279  soft-tissue]
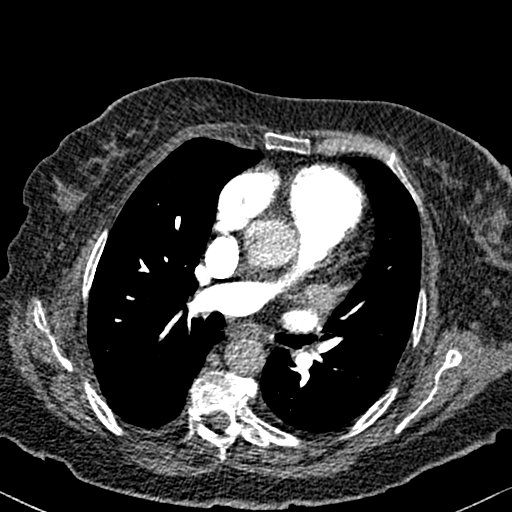
[im 182/279  lung]
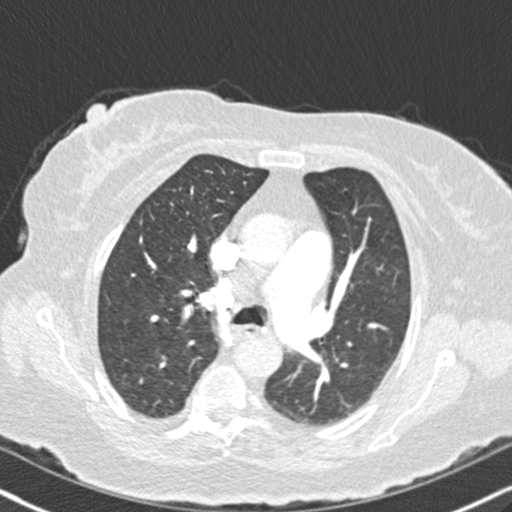
[im 206/279  soft-tissue]
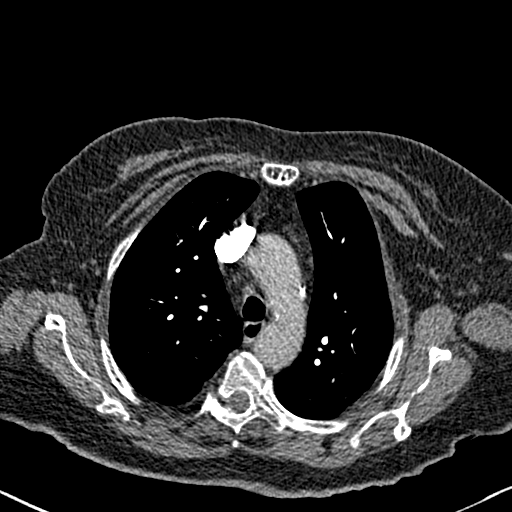
[im 218/279  lung]
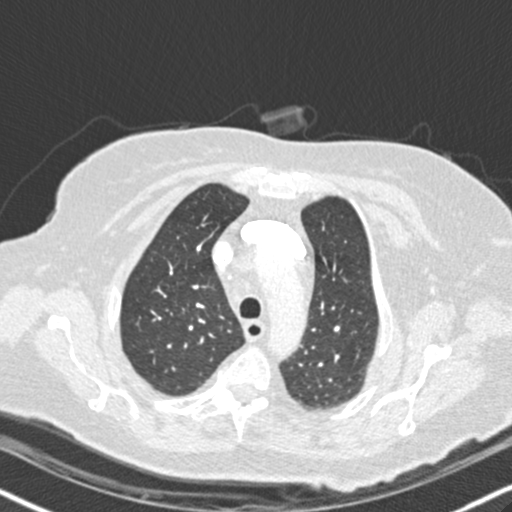
[im 242/279  soft-tissue]
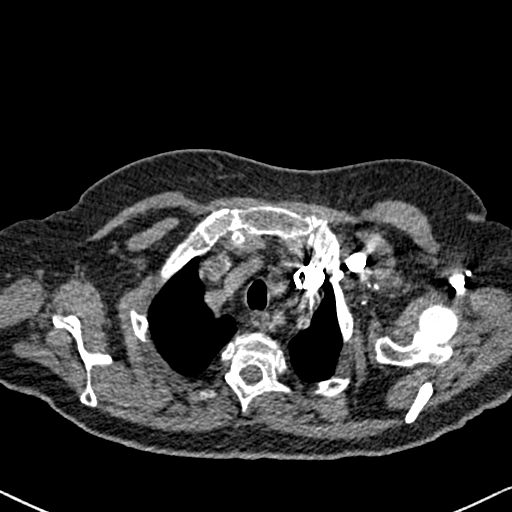
[im 266/279  lung]
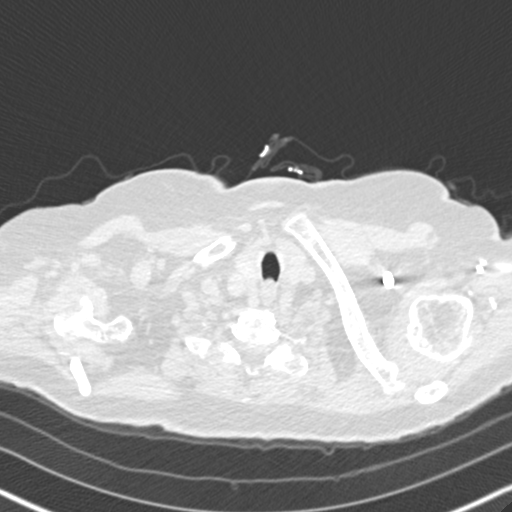

[Series 7: coronal mpr · coronal · 0.54mm/px · 3 of 138 slices shown]
[im 35/138  soft-tissue]
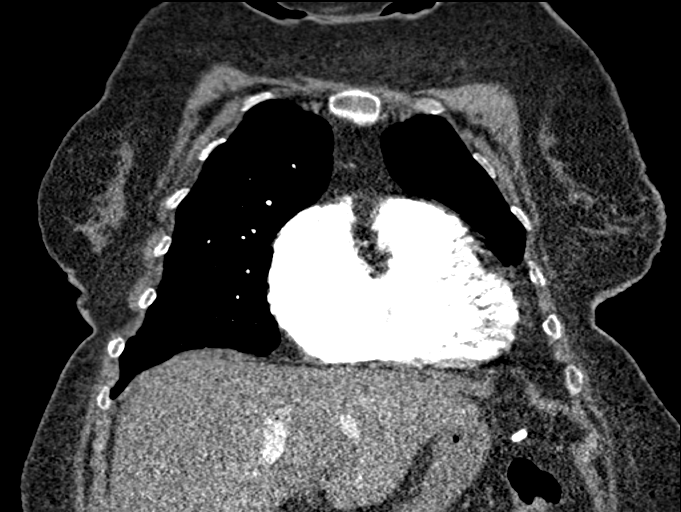
[im 69/138  soft-tissue]
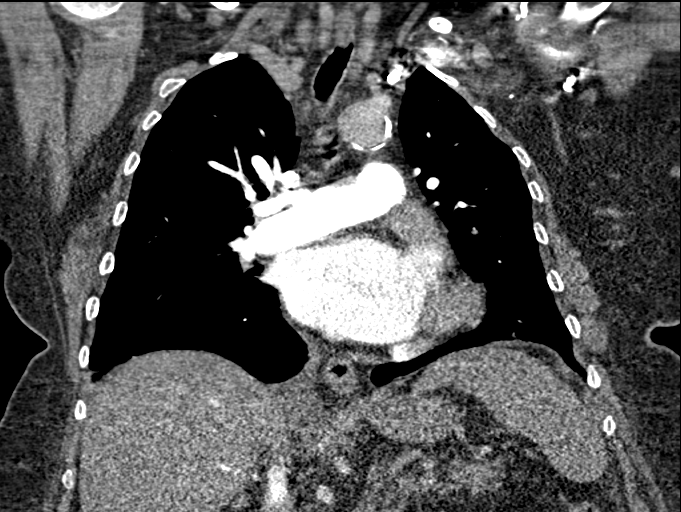
[im 103/138  soft-tissue]
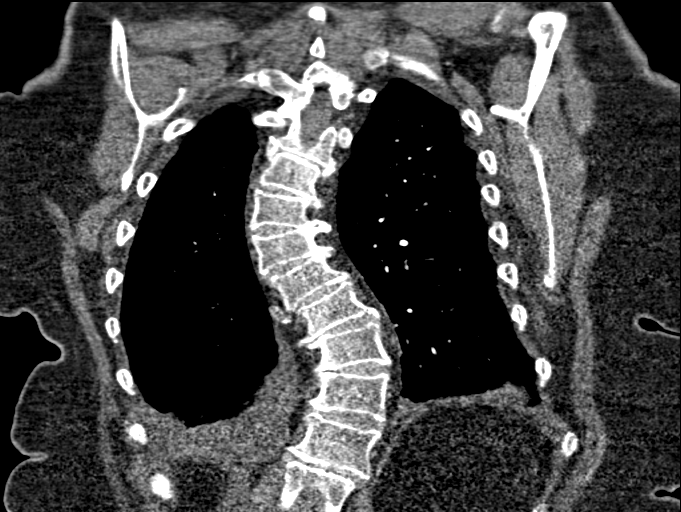

[16 of 46 positions shown; findings below may reference images not displayed]

FINDINGS: Vascular Findings:

There is adequate opacification of the pulmonary arterial system
with the main pulmonary artery measuring 500 Hounsfield units. There
are no discrete filling defects within the pulmonary arterial tree
to suggest pulmonary embolism. Borderline enlarged caliber the main
pulmonary artery measuring 32 mm in diameter.

Cardiomegaly. Coronary artery calcifications. No pericardial
effusion.

Scattered atherosclerotic plaque within a tortuous but normal
caliber thoracic aorta. No definite thoracic aortic aneurysm or
dissection on this nongated examination. The left vertebral artery
is incidentally noted to arise directly from the aortic arch.

Review of the MIP images confirms the above findings.

----------------------------------------------------------------------------------

Nonvascular Findings:

Mediastinum/Nodes: Solitary enlarged precarinal mediastinal lymph
node measures 1.4 cm in greatest short axis diameter (image 32,
series 4). Remaining mediastinal lymph nodes are numerous though
individually not enlarged by size criteria. No hilar axillary
lymphadenopathy.

Lungs/Pleura: There is a punctate (approximately 3 mm) not
definitely calcified nodule within the right lung apex (image 20,
series 8).

Note is made of a punctate (approximately 0.5 cm) slightly
ground-glass right lower lobe pulmonary nodule (52, series 6). There
is a punctate (approximately 0.6 cm) suspected para facial lymph
node along the right major fissure (image 48, series 6).

Note is made of a trace right-sided pleural effusion. Minimal
bibasilar dependent subpleural ground-glass atelectasis. No discrete
focal airspace opacities.

Central pulmonary airways appear patent.  No pneumothorax.

Upper Abdomen: Limited early arterial phase evaluation of the upper
abdomen demonstrates reflux of contrast into the intrahepatic venous
system. Dystrophic calcifications are seen within the left upper
abdominal quadrant (image 80, series 4), nonspecific though
potentially indicative of displaced torsed and now calcified
epiploic appendages.

Musculoskeletal: Moderate to severe rotatory scoliotic curvature of
the thoracolumbar spine with dominant caudal component convex to the
left measuring approximately 46 degrees (as measured from the
superior endplate of T8 to the inferior endplate of T12). Regional
soft tissues appear normal.

Review of the MIP images confirms the above findings.
IMPRESSION: 1. No evidence of pulmonary embolism.
2. Cardiomegaly, trace right-sided pleural effusion and reflux of
contrast into the hepatic veins, nonspecific though could be
indicative of early right-sided heart failure. Clinical correlation
is advised.
3. Borderline enlarged caliber the main pulmonary artery,
nonspecific though could be seen in the setting of pulmonary
arterial hypertension. Further evaluation cardiac echo could be
performed as clinically indicated.
4. Aortic Atherosclerosis ([id number]).
5. Solitary borderline enlarged mediastinal lymph node which while
nonspecific, in the absence of known malignancy, is favored to be
reactive etiology in the setting of a right-sided pleural effusion.
6. Punctate pulmonary nodules, largest of which within the right
lower lobe measures 5 mm in diameter. No follow-up needed if patient
is low-risk (and has no known or suspected primary neoplasm).
Non-contrast chest CT can be considered in 12 months if patient is
high-risk. This recommendation follows the consensus statement:
Guidelines for Management of Incidental Pulmonary Nodules Detected
[DATE].
7. Moderate to severe rotatory scoliotic curvature of the
thoracolumbar spine.
